# Patient Record
Sex: Female | Born: 1945 | Race: White | Hispanic: No | Smoking: Never smoker
Health system: Southern US, Community
[De-identification: ages and names within clinical notes are randomized; demographics above are authoritative.]

## PROBLEM LIST (undated history)

## (undated) DIAGNOSIS — I10 Essential (primary) hypertension: Secondary | ICD-10-CM

## (undated) DIAGNOSIS — E039 Hypothyroidism, unspecified: Secondary | ICD-10-CM

## (undated) HISTORY — PX: APPENDECTOMY: SHX54

## (undated) HISTORY — PX: WISDOM TOOTH EXTRACTION: SHX21

## (undated) HISTORY — DX: Hypothyroidism, unspecified: E03.9

## (undated) HISTORY — PX: TOTAL ABDOMINAL HYSTERECTOMY W/ BILATERAL SALPINGOOPHORECTOMY: SHX83

## (undated) HISTORY — PX: CHOLECYSTECTOMY: SHX55

## (undated) HISTORY — DX: Essential (primary) hypertension: I10

## (undated) HISTORY — PX: TONSILLECTOMY: SUR1361

---

## 1998-11-07 ENCOUNTER — Other Ambulatory Visit: Admission: RE | Admit: 1998-11-07 | Discharge: 1998-11-07 | Payer: Self-pay | Admitting: Obstetrics and Gynecology

## 1999-12-09 ENCOUNTER — Other Ambulatory Visit: Admission: RE | Admit: 1999-12-09 | Discharge: 1999-12-09 | Payer: Self-pay | Admitting: *Deleted

## 2000-12-10 ENCOUNTER — Other Ambulatory Visit: Admission: RE | Admit: 2000-12-10 | Discharge: 2000-12-10 | Payer: Self-pay | Admitting: *Deleted

## 2001-12-14 ENCOUNTER — Other Ambulatory Visit: Admission: RE | Admit: 2001-12-14 | Discharge: 2001-12-14 | Payer: Self-pay | Admitting: Obstetrics and Gynecology

## 2001-12-28 ENCOUNTER — Encounter: Payer: Self-pay | Admitting: Emergency Medicine

## 2001-12-28 ENCOUNTER — Emergency Department (HOSPITAL_COMMUNITY): Admission: EM | Admit: 2001-12-28 | Discharge: 2001-12-28 | Payer: Self-pay | Admitting: Emergency Medicine

## 2002-09-08 ENCOUNTER — Encounter: Payer: Self-pay | Admitting: Orthopedic Surgery

## 2002-09-08 ENCOUNTER — Emergency Department (HOSPITAL_COMMUNITY): Admission: EM | Admit: 2002-09-08 | Discharge: 2002-09-09 | Payer: Self-pay | Admitting: Emergency Medicine

## 2002-09-09 ENCOUNTER — Encounter: Payer: Self-pay | Admitting: Orthopedic Surgery

## 2002-12-16 ENCOUNTER — Other Ambulatory Visit: Admission: RE | Admit: 2002-12-16 | Discharge: 2002-12-16 | Payer: Self-pay | Admitting: Obstetrics and Gynecology

## 2004-08-19 ENCOUNTER — Ambulatory Visit: Payer: Self-pay | Admitting: Internal Medicine

## 2004-10-10 ENCOUNTER — Ambulatory Visit: Payer: Self-pay | Admitting: Internal Medicine

## 2004-10-29 ENCOUNTER — Ambulatory Visit: Payer: Self-pay | Admitting: Internal Medicine

## 2004-12-17 ENCOUNTER — Ambulatory Visit: Payer: Self-pay | Admitting: Internal Medicine

## 2005-03-20 ENCOUNTER — Ambulatory Visit: Payer: Self-pay | Admitting: Internal Medicine

## 2005-06-20 ENCOUNTER — Ambulatory Visit: Payer: Self-pay | Admitting: Internal Medicine

## 2005-09-18 ENCOUNTER — Ambulatory Visit: Payer: Self-pay | Admitting: Internal Medicine

## 2006-01-20 ENCOUNTER — Ambulatory Visit: Payer: Self-pay | Admitting: Internal Medicine

## 2006-01-26 ENCOUNTER — Ambulatory Visit: Payer: Self-pay | Admitting: Internal Medicine

## 2006-04-08 ENCOUNTER — Ambulatory Visit: Payer: Self-pay | Admitting: Internal Medicine

## 2006-05-28 ENCOUNTER — Encounter: Admission: RE | Admit: 2006-05-28 | Discharge: 2006-05-28 | Payer: Self-pay | Admitting: Obstetrics and Gynecology

## 2006-07-03 ENCOUNTER — Ambulatory Visit: Payer: Self-pay | Admitting: Internal Medicine

## 2006-09-23 ENCOUNTER — Ambulatory Visit: Payer: Self-pay | Admitting: Internal Medicine

## 2006-09-30 ENCOUNTER — Ambulatory Visit: Payer: Self-pay | Admitting: Internal Medicine

## 2006-10-07 ENCOUNTER — Ambulatory Visit: Payer: Self-pay | Admitting: Internal Medicine

## 2006-10-14 ENCOUNTER — Ambulatory Visit: Payer: Self-pay | Admitting: Internal Medicine

## 2006-10-21 ENCOUNTER — Ambulatory Visit: Payer: Self-pay | Admitting: Internal Medicine

## 2006-10-28 ENCOUNTER — Ambulatory Visit: Payer: Self-pay | Admitting: Internal Medicine

## 2006-11-03 ENCOUNTER — Ambulatory Visit: Payer: Self-pay | Admitting: Internal Medicine

## 2006-11-09 ENCOUNTER — Ambulatory Visit: Payer: Self-pay | Admitting: Internal Medicine

## 2006-11-16 ENCOUNTER — Ambulatory Visit: Payer: Self-pay | Admitting: Internal Medicine

## 2006-11-23 ENCOUNTER — Ambulatory Visit: Payer: Self-pay | Admitting: Internal Medicine

## 2006-11-30 ENCOUNTER — Ambulatory Visit: Payer: Self-pay | Admitting: Internal Medicine

## 2006-12-07 ENCOUNTER — Ambulatory Visit: Payer: Self-pay | Admitting: Internal Medicine

## 2006-12-14 ENCOUNTER — Ambulatory Visit: Payer: Self-pay | Admitting: Internal Medicine

## 2006-12-21 ENCOUNTER — Ambulatory Visit: Payer: Self-pay | Admitting: Internal Medicine

## 2006-12-28 ENCOUNTER — Ambulatory Visit: Payer: Self-pay | Admitting: Internal Medicine

## 2007-01-01 ENCOUNTER — Ambulatory Visit: Payer: Self-pay | Admitting: Internal Medicine

## 2007-01-04 ENCOUNTER — Ambulatory Visit: Payer: Self-pay | Admitting: Internal Medicine

## 2007-01-13 ENCOUNTER — Ambulatory Visit: Payer: Self-pay | Admitting: Internal Medicine

## 2007-01-14 ENCOUNTER — Ambulatory Visit: Payer: Self-pay | Admitting: Internal Medicine

## 2007-01-20 ENCOUNTER — Ambulatory Visit: Payer: Self-pay | Admitting: Internal Medicine

## 2007-01-27 ENCOUNTER — Ambulatory Visit: Payer: Self-pay | Admitting: Internal Medicine

## 2007-02-03 ENCOUNTER — Ambulatory Visit: Payer: Self-pay | Admitting: Internal Medicine

## 2007-02-10 ENCOUNTER — Ambulatory Visit: Payer: Self-pay | Admitting: Internal Medicine

## 2007-02-17 ENCOUNTER — Ambulatory Visit: Payer: Self-pay | Admitting: Internal Medicine

## 2007-02-25 ENCOUNTER — Ambulatory Visit: Payer: Self-pay | Admitting: Internal Medicine

## 2007-03-04 ENCOUNTER — Ambulatory Visit: Payer: Self-pay | Admitting: Internal Medicine

## 2007-03-12 ENCOUNTER — Ambulatory Visit: Payer: Self-pay | Admitting: Internal Medicine

## 2007-03-19 ENCOUNTER — Ambulatory Visit: Payer: Self-pay | Admitting: Internal Medicine

## 2007-03-29 ENCOUNTER — Ambulatory Visit: Payer: Self-pay | Admitting: Internal Medicine

## 2007-04-05 ENCOUNTER — Ambulatory Visit: Payer: Self-pay | Admitting: Internal Medicine

## 2007-04-12 ENCOUNTER — Ambulatory Visit: Payer: Self-pay | Admitting: Internal Medicine

## 2007-04-19 ENCOUNTER — Ambulatory Visit: Payer: Self-pay | Admitting: Internal Medicine

## 2007-04-27 ENCOUNTER — Ambulatory Visit: Payer: Self-pay | Admitting: Internal Medicine

## 2007-05-04 ENCOUNTER — Ambulatory Visit: Payer: Self-pay | Admitting: Internal Medicine

## 2007-05-11 ENCOUNTER — Ambulatory Visit: Payer: Self-pay | Admitting: Internal Medicine

## 2007-05-19 ENCOUNTER — Ambulatory Visit: Payer: Self-pay | Admitting: Internal Medicine

## 2007-05-27 ENCOUNTER — Ambulatory Visit: Payer: Self-pay | Admitting: Internal Medicine

## 2007-06-03 ENCOUNTER — Ambulatory Visit: Payer: Self-pay | Admitting: Internal Medicine

## 2007-06-10 ENCOUNTER — Ambulatory Visit: Payer: Self-pay | Admitting: Internal Medicine

## 2007-06-18 ENCOUNTER — Ambulatory Visit: Payer: Self-pay | Admitting: Internal Medicine

## 2007-06-24 ENCOUNTER — Ambulatory Visit: Payer: Self-pay | Admitting: Internal Medicine

## 2007-07-01 ENCOUNTER — Ambulatory Visit: Payer: Self-pay | Admitting: Internal Medicine

## 2007-07-08 ENCOUNTER — Ambulatory Visit: Payer: Self-pay | Admitting: Internal Medicine

## 2007-07-14 ENCOUNTER — Ambulatory Visit: Payer: Self-pay | Admitting: Internal Medicine

## 2007-07-14 DIAGNOSIS — J4520 Mild intermittent asthma, uncomplicated: Secondary | ICD-10-CM | POA: Insufficient documentation

## 2007-07-14 DIAGNOSIS — J309 Allergic rhinitis, unspecified: Secondary | ICD-10-CM | POA: Insufficient documentation

## 2007-07-21 ENCOUNTER — Ambulatory Visit: Payer: Self-pay | Admitting: Internal Medicine

## 2007-07-27 ENCOUNTER — Ambulatory Visit: Payer: Self-pay | Admitting: Internal Medicine

## 2007-08-02 ENCOUNTER — Ambulatory Visit: Payer: Self-pay | Admitting: Internal Medicine

## 2007-08-09 ENCOUNTER — Ambulatory Visit: Payer: Self-pay | Admitting: Internal Medicine

## 2007-08-17 ENCOUNTER — Ambulatory Visit: Payer: Self-pay | Admitting: Internal Medicine

## 2007-08-23 ENCOUNTER — Ambulatory Visit: Payer: Self-pay | Admitting: Internal Medicine

## 2007-08-30 ENCOUNTER — Ambulatory Visit: Payer: Self-pay | Admitting: Internal Medicine

## 2007-09-06 ENCOUNTER — Ambulatory Visit: Payer: Self-pay | Admitting: Internal Medicine

## 2007-09-13 ENCOUNTER — Ambulatory Visit: Payer: Self-pay | Admitting: Internal Medicine

## 2007-09-20 ENCOUNTER — Ambulatory Visit: Payer: Self-pay | Admitting: Internal Medicine

## 2007-09-21 ENCOUNTER — Ambulatory Visit: Payer: Self-pay | Admitting: Internal Medicine

## 2007-09-28 ENCOUNTER — Ambulatory Visit: Payer: Self-pay | Admitting: Internal Medicine

## 2007-10-07 ENCOUNTER — Ambulatory Visit: Payer: Self-pay | Admitting: Internal Medicine

## 2007-10-12 ENCOUNTER — Ambulatory Visit: Payer: Self-pay | Admitting: Internal Medicine

## 2007-10-18 ENCOUNTER — Ambulatory Visit: Payer: Self-pay | Admitting: Internal Medicine

## 2007-10-25 ENCOUNTER — Ambulatory Visit: Payer: Self-pay | Admitting: Internal Medicine

## 2007-11-01 ENCOUNTER — Ambulatory Visit: Payer: Self-pay | Admitting: Internal Medicine

## 2007-11-08 ENCOUNTER — Ambulatory Visit: Payer: Self-pay | Admitting: Internal Medicine

## 2007-11-15 ENCOUNTER — Ambulatory Visit: Payer: Self-pay | Admitting: Internal Medicine

## 2007-11-23 ENCOUNTER — Ambulatory Visit: Payer: Self-pay | Admitting: Internal Medicine

## 2007-11-29 ENCOUNTER — Ambulatory Visit: Payer: Self-pay | Admitting: Internal Medicine

## 2007-12-06 ENCOUNTER — Ambulatory Visit: Payer: Self-pay | Admitting: Internal Medicine

## 2007-12-13 ENCOUNTER — Ambulatory Visit: Payer: Self-pay | Admitting: Internal Medicine

## 2007-12-20 ENCOUNTER — Ambulatory Visit: Payer: Self-pay | Admitting: Internal Medicine

## 2007-12-27 ENCOUNTER — Ambulatory Visit: Payer: Self-pay | Admitting: Internal Medicine

## 2008-01-03 ENCOUNTER — Ambulatory Visit: Payer: Self-pay | Admitting: Internal Medicine

## 2008-01-11 ENCOUNTER — Ambulatory Visit: Payer: Self-pay | Admitting: Internal Medicine

## 2008-01-18 ENCOUNTER — Ambulatory Visit: Payer: Self-pay | Admitting: Internal Medicine

## 2008-01-18 ENCOUNTER — Telehealth (INDEPENDENT_AMBULATORY_CARE_PROVIDER_SITE_OTHER): Payer: Self-pay | Admitting: *Deleted

## 2008-01-19 ENCOUNTER — Ambulatory Visit: Payer: Self-pay | Admitting: Internal Medicine

## 2008-01-24 ENCOUNTER — Ambulatory Visit: Payer: Self-pay | Admitting: Internal Medicine

## 2008-01-31 ENCOUNTER — Ambulatory Visit: Payer: Self-pay | Admitting: Internal Medicine

## 2008-01-31 ENCOUNTER — Ambulatory Visit: Payer: Self-pay | Admitting: Pulmonary Disease

## 2008-02-07 ENCOUNTER — Ambulatory Visit: Payer: Self-pay | Admitting: Internal Medicine

## 2008-02-14 ENCOUNTER — Ambulatory Visit: Payer: Self-pay | Admitting: Internal Medicine

## 2008-02-16 ENCOUNTER — Ambulatory Visit: Payer: Self-pay | Admitting: Internal Medicine

## 2008-02-16 DIAGNOSIS — J018 Other acute sinusitis: Secondary | ICD-10-CM | POA: Insufficient documentation

## 2008-02-21 ENCOUNTER — Ambulatory Visit: Payer: Self-pay | Admitting: Internal Medicine

## 2008-03-20 ENCOUNTER — Ambulatory Visit: Payer: Self-pay | Admitting: Internal Medicine

## 2008-03-27 ENCOUNTER — Ambulatory Visit: Payer: Self-pay | Admitting: Internal Medicine

## 2008-04-03 ENCOUNTER — Ambulatory Visit: Payer: Self-pay | Admitting: Internal Medicine

## 2008-04-11 ENCOUNTER — Ambulatory Visit: Payer: Self-pay | Admitting: Internal Medicine

## 2008-04-17 ENCOUNTER — Ambulatory Visit: Payer: Self-pay | Admitting: Internal Medicine

## 2008-04-24 ENCOUNTER — Ambulatory Visit: Payer: Self-pay | Admitting: Internal Medicine

## 2008-05-02 ENCOUNTER — Ambulatory Visit: Payer: Self-pay | Admitting: Internal Medicine

## 2008-05-08 ENCOUNTER — Ambulatory Visit: Payer: Self-pay | Admitting: Internal Medicine

## 2008-05-10 ENCOUNTER — Ambulatory Visit: Payer: Self-pay | Admitting: Internal Medicine

## 2008-05-12 LAB — HM COLONOSCOPY

## 2008-05-15 ENCOUNTER — Ambulatory Visit: Payer: Self-pay | Admitting: Internal Medicine

## 2008-05-22 ENCOUNTER — Ambulatory Visit: Payer: Self-pay | Admitting: Internal Medicine

## 2008-05-30 ENCOUNTER — Ambulatory Visit: Payer: Self-pay | Admitting: Internal Medicine

## 2008-05-30 DIAGNOSIS — J111 Influenza due to unidentified influenza virus with other respiratory manifestations: Secondary | ICD-10-CM | POA: Insufficient documentation

## 2008-05-30 DIAGNOSIS — E039 Hypothyroidism, unspecified: Secondary | ICD-10-CM | POA: Insufficient documentation

## 2008-06-05 ENCOUNTER — Ambulatory Visit: Payer: Self-pay | Admitting: Internal Medicine

## 2008-06-13 ENCOUNTER — Ambulatory Visit: Payer: Self-pay | Admitting: Internal Medicine

## 2008-06-19 ENCOUNTER — Ambulatory Visit: Payer: Self-pay | Admitting: Internal Medicine

## 2008-06-26 ENCOUNTER — Ambulatory Visit: Payer: Self-pay | Admitting: Internal Medicine

## 2008-07-03 ENCOUNTER — Ambulatory Visit: Payer: Self-pay | Admitting: Internal Medicine

## 2008-07-10 ENCOUNTER — Ambulatory Visit: Payer: Self-pay | Admitting: Internal Medicine

## 2008-07-17 ENCOUNTER — Ambulatory Visit: Payer: Self-pay | Admitting: Internal Medicine

## 2008-07-24 ENCOUNTER — Ambulatory Visit: Payer: Self-pay | Admitting: Internal Medicine

## 2008-07-31 ENCOUNTER — Ambulatory Visit: Payer: Self-pay | Admitting: Internal Medicine

## 2008-08-07 ENCOUNTER — Ambulatory Visit: Payer: Self-pay | Admitting: Internal Medicine

## 2008-08-14 ENCOUNTER — Ambulatory Visit: Payer: Self-pay | Admitting: Internal Medicine

## 2008-08-21 ENCOUNTER — Ambulatory Visit: Payer: Self-pay | Admitting: Internal Medicine

## 2008-08-28 ENCOUNTER — Ambulatory Visit: Payer: Self-pay | Admitting: Internal Medicine

## 2008-09-04 ENCOUNTER — Ambulatory Visit: Payer: Self-pay | Admitting: Internal Medicine

## 2008-09-12 ENCOUNTER — Ambulatory Visit: Payer: Self-pay | Admitting: Internal Medicine

## 2008-09-13 ENCOUNTER — Ambulatory Visit: Payer: Self-pay | Admitting: Internal Medicine

## 2008-09-18 ENCOUNTER — Ambulatory Visit: Payer: Self-pay | Admitting: Internal Medicine

## 2008-09-25 ENCOUNTER — Ambulatory Visit: Payer: Self-pay | Admitting: Internal Medicine

## 2008-10-02 ENCOUNTER — Ambulatory Visit: Payer: Self-pay | Admitting: Internal Medicine

## 2008-10-10 ENCOUNTER — Ambulatory Visit: Payer: Self-pay | Admitting: Internal Medicine

## 2008-10-16 ENCOUNTER — Ambulatory Visit: Payer: Self-pay | Admitting: Internal Medicine

## 2008-10-23 ENCOUNTER — Ambulatory Visit: Payer: Self-pay | Admitting: Internal Medicine

## 2008-10-30 ENCOUNTER — Ambulatory Visit: Payer: Self-pay | Admitting: Internal Medicine

## 2008-11-06 ENCOUNTER — Ambulatory Visit: Payer: Self-pay | Admitting: Internal Medicine

## 2008-11-14 ENCOUNTER — Ambulatory Visit: Payer: Self-pay | Admitting: Internal Medicine

## 2008-11-20 ENCOUNTER — Ambulatory Visit: Payer: Self-pay | Admitting: Internal Medicine

## 2008-11-23 ENCOUNTER — Ambulatory Visit: Payer: Self-pay | Admitting: Internal Medicine

## 2008-11-27 ENCOUNTER — Ambulatory Visit: Payer: Self-pay | Admitting: Internal Medicine

## 2008-12-04 ENCOUNTER — Ambulatory Visit: Payer: Self-pay | Admitting: Internal Medicine

## 2008-12-12 ENCOUNTER — Ambulatory Visit: Payer: Self-pay | Admitting: Internal Medicine

## 2008-12-18 ENCOUNTER — Ambulatory Visit: Payer: Self-pay | Admitting: Internal Medicine

## 2008-12-25 ENCOUNTER — Ambulatory Visit: Payer: Self-pay | Admitting: Internal Medicine

## 2009-01-08 ENCOUNTER — Ambulatory Visit: Payer: Self-pay | Admitting: Internal Medicine

## 2009-01-09 ENCOUNTER — Ambulatory Visit: Payer: Self-pay | Admitting: Internal Medicine

## 2009-01-16 ENCOUNTER — Ambulatory Visit: Payer: Self-pay | Admitting: Internal Medicine

## 2009-01-22 ENCOUNTER — Ambulatory Visit: Payer: Self-pay | Admitting: Internal Medicine

## 2009-01-29 ENCOUNTER — Ambulatory Visit: Payer: Self-pay | Admitting: Internal Medicine

## 2009-02-05 ENCOUNTER — Ambulatory Visit: Payer: Self-pay | Admitting: Internal Medicine

## 2009-02-15 ENCOUNTER — Ambulatory Visit: Payer: Self-pay | Admitting: Internal Medicine

## 2009-02-22 ENCOUNTER — Ambulatory Visit: Payer: Self-pay | Admitting: Internal Medicine

## 2009-03-01 ENCOUNTER — Ambulatory Visit: Payer: Self-pay | Admitting: Internal Medicine

## 2009-03-15 ENCOUNTER — Ambulatory Visit: Payer: Self-pay | Admitting: Internal Medicine

## 2009-03-20 ENCOUNTER — Ambulatory Visit: Payer: Self-pay | Admitting: Internal Medicine

## 2009-03-26 ENCOUNTER — Ambulatory Visit: Payer: Self-pay | Admitting: Internal Medicine

## 2009-04-02 ENCOUNTER — Ambulatory Visit: Payer: Self-pay | Admitting: Internal Medicine

## 2009-04-09 ENCOUNTER — Ambulatory Visit: Payer: Self-pay | Admitting: Internal Medicine

## 2009-04-16 ENCOUNTER — Ambulatory Visit: Payer: Self-pay | Admitting: Internal Medicine

## 2009-04-23 ENCOUNTER — Ambulatory Visit: Payer: Self-pay | Admitting: Internal Medicine

## 2009-04-23 ENCOUNTER — Telehealth: Payer: Self-pay | Admitting: Internal Medicine

## 2009-04-30 ENCOUNTER — Ambulatory Visit: Payer: Self-pay | Admitting: Internal Medicine

## 2009-05-07 ENCOUNTER — Ambulatory Visit: Payer: Self-pay | Admitting: Internal Medicine

## 2009-05-08 ENCOUNTER — Ambulatory Visit: Payer: Self-pay | Admitting: Internal Medicine

## 2009-05-15 ENCOUNTER — Ambulatory Visit: Payer: Self-pay | Admitting: Internal Medicine

## 2009-05-21 ENCOUNTER — Ambulatory Visit: Payer: Self-pay | Admitting: Internal Medicine

## 2009-05-28 ENCOUNTER — Ambulatory Visit: Payer: Self-pay | Admitting: Internal Medicine

## 2009-06-04 ENCOUNTER — Ambulatory Visit: Payer: Self-pay | Admitting: Internal Medicine

## 2009-06-11 ENCOUNTER — Ambulatory Visit: Payer: Self-pay | Admitting: Internal Medicine

## 2009-06-18 ENCOUNTER — Ambulatory Visit: Payer: Self-pay | Admitting: Internal Medicine

## 2009-06-26 ENCOUNTER — Ambulatory Visit: Payer: Self-pay | Admitting: Internal Medicine

## 2009-07-02 ENCOUNTER — Ambulatory Visit: Payer: Self-pay | Admitting: Internal Medicine

## 2009-07-09 ENCOUNTER — Ambulatory Visit: Payer: Self-pay | Admitting: Internal Medicine

## 2009-07-16 ENCOUNTER — Ambulatory Visit: Payer: Self-pay | Admitting: Internal Medicine

## 2009-07-23 ENCOUNTER — Ambulatory Visit: Payer: Self-pay | Admitting: Internal Medicine

## 2009-07-30 ENCOUNTER — Ambulatory Visit: Payer: Self-pay | Admitting: Internal Medicine

## 2009-08-07 ENCOUNTER — Ambulatory Visit: Payer: Self-pay | Admitting: Internal Medicine

## 2009-08-13 ENCOUNTER — Ambulatory Visit: Payer: Self-pay | Admitting: Internal Medicine

## 2009-08-23 ENCOUNTER — Ambulatory Visit: Payer: Self-pay | Admitting: Internal Medicine

## 2009-08-29 ENCOUNTER — Ambulatory Visit: Payer: Self-pay | Admitting: Internal Medicine

## 2009-08-30 ENCOUNTER — Ambulatory Visit: Payer: Self-pay | Admitting: Internal Medicine

## 2009-09-03 ENCOUNTER — Ambulatory Visit: Payer: Self-pay | Admitting: Internal Medicine

## 2009-09-10 ENCOUNTER — Ambulatory Visit: Payer: Self-pay | Admitting: Internal Medicine

## 2009-09-17 ENCOUNTER — Ambulatory Visit: Payer: Self-pay | Admitting: Internal Medicine

## 2009-09-24 ENCOUNTER — Ambulatory Visit: Payer: Self-pay | Admitting: Internal Medicine

## 2009-10-01 ENCOUNTER — Ambulatory Visit: Payer: Self-pay | Admitting: Internal Medicine

## 2009-10-09 ENCOUNTER — Telehealth: Payer: Self-pay | Admitting: Internal Medicine

## 2009-10-09 ENCOUNTER — Ambulatory Visit: Payer: Self-pay | Admitting: Internal Medicine

## 2009-10-15 ENCOUNTER — Ambulatory Visit: Payer: Self-pay | Admitting: Internal Medicine

## 2009-10-22 ENCOUNTER — Ambulatory Visit: Payer: Self-pay | Admitting: Internal Medicine

## 2009-10-29 ENCOUNTER — Ambulatory Visit: Payer: Self-pay | Admitting: Internal Medicine

## 2009-11-05 ENCOUNTER — Ambulatory Visit: Payer: Self-pay | Admitting: Internal Medicine

## 2009-11-14 ENCOUNTER — Ambulatory Visit: Payer: Self-pay | Admitting: Internal Medicine

## 2009-11-19 ENCOUNTER — Ambulatory Visit: Payer: Self-pay | Admitting: Internal Medicine

## 2009-11-26 ENCOUNTER — Ambulatory Visit: Payer: Self-pay | Admitting: Internal Medicine

## 2009-12-03 ENCOUNTER — Ambulatory Visit: Payer: Self-pay | Admitting: Internal Medicine

## 2009-12-10 ENCOUNTER — Ambulatory Visit: Payer: Self-pay | Admitting: Internal Medicine

## 2009-12-17 ENCOUNTER — Ambulatory Visit: Payer: Self-pay | Admitting: Internal Medicine

## 2009-12-25 ENCOUNTER — Ambulatory Visit: Payer: Self-pay | Admitting: Internal Medicine

## 2009-12-28 ENCOUNTER — Ambulatory Visit: Payer: Self-pay | Admitting: Internal Medicine

## 2009-12-31 ENCOUNTER — Ambulatory Visit: Payer: Self-pay | Admitting: Internal Medicine

## 2010-01-07 ENCOUNTER — Ambulatory Visit: Payer: Self-pay | Admitting: Internal Medicine

## 2010-01-15 ENCOUNTER — Ambulatory Visit: Payer: Self-pay | Admitting: Internal Medicine

## 2010-01-21 ENCOUNTER — Ambulatory Visit: Payer: Self-pay | Admitting: Internal Medicine

## 2010-01-28 ENCOUNTER — Ambulatory Visit: Payer: Self-pay | Admitting: Internal Medicine

## 2010-02-04 ENCOUNTER — Ambulatory Visit: Payer: Self-pay | Admitting: Internal Medicine

## 2010-02-11 ENCOUNTER — Ambulatory Visit: Payer: Self-pay | Admitting: Internal Medicine

## 2010-02-18 ENCOUNTER — Ambulatory Visit: Payer: Self-pay | Admitting: Internal Medicine

## 2010-02-26 ENCOUNTER — Ambulatory Visit: Payer: Self-pay | Admitting: Internal Medicine

## 2010-02-28 ENCOUNTER — Ambulatory Visit: Payer: Self-pay | Admitting: Internal Medicine

## 2010-03-04 ENCOUNTER — Ambulatory Visit: Payer: Self-pay | Admitting: Internal Medicine

## 2010-03-11 ENCOUNTER — Ambulatory Visit: Payer: Self-pay | Admitting: Internal Medicine

## 2010-03-18 ENCOUNTER — Telehealth: Payer: Self-pay | Admitting: Internal Medicine

## 2010-03-18 ENCOUNTER — Ambulatory Visit: Payer: Self-pay | Admitting: Internal Medicine

## 2010-03-25 ENCOUNTER — Ambulatory Visit: Payer: Self-pay | Admitting: Internal Medicine

## 2010-04-01 ENCOUNTER — Ambulatory Visit: Payer: Self-pay | Admitting: Internal Medicine

## 2010-04-09 ENCOUNTER — Ambulatory Visit: Payer: Self-pay | Admitting: Internal Medicine

## 2010-04-15 ENCOUNTER — Ambulatory Visit: Payer: Self-pay | Admitting: Internal Medicine

## 2010-04-22 ENCOUNTER — Ambulatory Visit: Payer: Self-pay | Admitting: Internal Medicine

## 2010-04-30 ENCOUNTER — Ambulatory Visit: Payer: Self-pay | Admitting: Internal Medicine

## 2010-05-07 ENCOUNTER — Ambulatory Visit: Payer: Self-pay | Admitting: Internal Medicine

## 2010-05-10 ENCOUNTER — Ambulatory Visit: Payer: Self-pay | Admitting: Internal Medicine

## 2010-05-27 ENCOUNTER — Ambulatory Visit: Payer: Self-pay | Admitting: Internal Medicine

## 2010-05-30 ENCOUNTER — Ambulatory Visit: Payer: Self-pay | Admitting: Internal Medicine

## 2010-06-02 ENCOUNTER — Encounter: Payer: Self-pay | Admitting: Obstetrics and Gynecology

## 2010-06-10 ENCOUNTER — Ambulatory Visit: Payer: Self-pay | Admitting: Internal Medicine

## 2010-06-13 NOTE — Miscellaneous (Signed)
Summary: Injection Record/[facility]  Injection Record/[facility]   Imported By: Sherian Rein 10/02/2009 11:17:37  _____________________________________________________________________  External Attachment:    Type:   Image     Comment:   External Document

## 2010-06-13 NOTE — Assessment & Plan Note (Signed)
Summary: Alicia Wallace (yearly f/u per pt ) kp   Primary Provider/Referring Provider:  Altheimer  CC:  yearly follow up visit-alleriges..  History of Present Illness: 02/16/08: 4 days ? sinusitis with HA, yellow, ears ache, sore throat. Took Equate for allergy. catheter continues allergy vaccine 1:10.  Has had flu shot.  05/30/08- Asthma, allergic rhinitis 1 week ago began chilling. Had fever initially, coughing yellow, myalgias, little GI. Had seasonal but not H1N1 flu. Chest tight. "Sunday for SOB went to [facility], put on zpak, now day 3 but feeling worse. Headache.  February 15, 2009- Asthma, rhinitis Had flu vax last week. Feeling  very well now with no problems since her flu syndrome last winter.  Allergy vaccine continues to help in her opinion and has been well tolerated.. Allegra D caused tachycardia but generic loratadine is serving her well, taken most days. Sometimes winter associated with sinus probs and headaches, but not wheeze or chest tightness.  April 09, 2010- Asthma, rhinitis Nurse-CC: yearly follow up visit-alleriges. Doing well. Med talk- especially utility of Singulair. No recent wheeze or need for rescue inhaler. Did have sinusitis and we called in Z pak a couple of weeks ago. Feels well. Voice may come and go. Some increase in heartburn lately, but doesn't wake her. Discussed reflux/ acid control. Continues doing well with allergy vaccine.    Asthma History    Initial Asthma Severity Rating:    Age range: 12+ years    Symptoms: 0-2 days/week    Nighttime Awakenings: 0-2/month    Interferes w/ normal activity: no limitations    SABA use (not for EIB): 0-2 days/week    Asthma Severity Assessment: Intermittent   Preventive Screening-Counseling & Management  Alcohol-Tobacco     Smoking Status: never  Current Medications (verified): 1)  Allergy Vaccine Gh 1:10 2)  Epipen 2-Pak 0.3 Mg/0.3ml Devi (Epinephrine) .... Use As Directed 3)  Singulair 10 Mg  Tabs (Montelukast  Sodium) .... Take 1 Tablet By Mouth Once A Day 4)  Premarin 0.3 Mg Tabs (Estrogens Conjugated) .... Take 1 By Mouth Once Daily 5)  Synthroid 75 Mcg  Tabs (Levothyroxine Sodium) .... Take 1 Tablet By Mouth Once A Day 6)  Toprol Xl 100 Mg  Tb24 (Metoprolol Succinate) 7)  Proair Hfa 108 (90 Base) Mcg/act Aers (Albuterol Sulfate) .... 2 Puffs Four Times A Day As Needed  Allergies (verified): 1)  ! Penicillin 2)  ! Sulfa  Review of Systems      See HPI  The patient denies shortness of breath with activity, shortness of breath at rest, productive cough, non-productive cough, coughing up blood, chest pain, irregular heartbeats, acid heartburn, indigestion, loss of appetite, weight change, abdominal pain, difficulty swallowing, sore throat, tooth/dental problems, headaches, nasal congestion/difficulty breathing through nose, and sneezing.    Vital Signs:  Patient profile:   65 year old female Height:      60 inches Weight:      152.38 pounds BMI:     29.87 O2 Sat:      98"  % on Room air Pulse rate:   88 / minute BP sitting:   134 / 80  (left arm) Cuff size:   large  Vitals Entered By: Reynaldo Minium CMA (April 09, 2010 9:21 AM)  O2 Flow:  Room air  Physical Exam  Additional Exam:  General: A/Ox3; pleasant and cooperative, NAD, hoarse transiently, 98% on room air SKIN: no rash, lesions NODES: no lymphadenopathy HEENT: Bolt/AT, EOM- WNL, Conjuctivae- clear, PERRLA, TM-WNL, Nose-  clear, Throat- clear and wnl, Mallampati III NECK: Supple w/ fair ROM, JVD- none, normal carotid impulses w/o bruits Thyroid- CHEST: Clear to P&A,  HEART: RRR, no m/g/r heard ABDOMEN: Soft and nl;  QQV:ZDGL, nl pulses, no edema  NEURO: Grossly intact to observation      Impression & Recommendations:  Problem # 1:  ALLERGIC RHINITIS (ICD-477.9)  Good control. She will continue allergy vaccine as discussed. She resolved a recent sinusitis Disciussed reflux precautions possibly pertinent to occasional  hoarsesness  Orders: Est. Patient Level III (87564)  Problem # 2:  ASTHMA (ICD-493.90) Good control with mild intermittent symptoms. She uses rescue inhaler appropriately when needed.   Medications Added to Medication List This Visit: 1)  Epipen 2-pak 0.3 Mg/0.31ml Devi (Epinephrine) .... Use as directed 2)  Premarin 0.3 Mg Tabs (Estrogens conjugated) .... Take 1 by mouth once daily  Patient Instructions: 1)  Please schedule a follow-up appointment in 1 year. Call sooner if needed. 2)  Refill scripts for Singulair and Epipen sent to [facility] 3)  Consider trying on and off Singulair a week or so at a time to see if it is helping you.  4)  consider trying an otc acid blocker to protect from heartburn 5)  omeprazole or generics of Prilosec are good choices Prescriptions: SINGULAIR 10 MG  TABS (MONTELUKAST SODIUM) Take 1 tablet by mouth once a day  #30 Each x prn   Entered and Authorized by:   Waymon Budge MD   Signed by:   Waymon Budge MD on 04/09/2010   Method used:   Electronically to        [facility] [address] (retail)       [phone]       [place], [place]  [address]       Ph: [phone]       Fax: [phone]   RxID:   [id number] EPIPEN 2-PAK 0.3 MG/0.3ML DEVI (EPINEPHRINE) Use as directed  #1 x prn   Entered and Authorized by:   Waymon Budge MD   Signed by:   Waymon Budge MD on 04/09/2010   Method used:   Electronically to        [facility] [address] (retail)       [phone]       [place], [place]  [address]       Ph: [phone]       Fax: [phone]   RxID:   [id number]

## 2010-06-13 NOTE — Miscellaneous (Signed)
Summary: Injection Record / [facility]    Injection Record / [facility]    Imported By: Lennie Odor 10/02/2009 15:23:48  _____________________________________________________________________  External Attachment:    Type:   Image     Comment:   External Document

## 2010-06-13 NOTE — Progress Notes (Signed)
Summary:  SICK Needs a Z-Pak  Phone Note Other Incoming   Caller: Pt. came in for her allery shot. Summary of Call: Mrs.Blann came in today she has taken mucinex&mucinex DM for the last wk. or two she is now coughing up and or blowing yellow or green mucus. She realizes she is due for a follow up but could not get an appt. with you till later this month. Would you please call in a Z-Pak at [facility] on [address]?(CB [phone])    Initial call taken by: Dimas Millin,  March 18, 2010 3:50 PM  Follow-up for Phone Call        Per CDY-okay to give Zpak #1 take as directed no refills.Reynaldo Minium CMA  March 18, 2010 4:19 PM   Additional Follow-up for Phone Call Additional follow up Details #1::        Rx for zpack sent- [facility] Vernie Murders  March 18, 2010 4:23 PM   [facility] Gweneth Dimitri RN  March 19, 2010 4:24PM  could not get ahold of pt so I called the pharmacy and pt has picked up rx for zpak. Carron Curie CMA  March 20, 2010 10:46 AM     New/Updated Medications: ZITHROMAX Z-PAK 250 MG TABS (AZITHROMYCIN) take as directed Prescriptions: ZITHROMAX Z-PAK 250 MG TABS (AZITHROMYCIN) take as directed  #1 x 0   Entered by:   Vernie Murders   Authorized by:   Waymon Budge MD   Signed by:   Vernie Murders on 03/18/2010   Method used:   Electronically to        [facility] [address] (retail)       [phone]       [place], [place]  [address]       Ph: [phone]       Fax: [phone]   RxID:   [id number]

## 2010-06-13 NOTE — Progress Notes (Signed)
Summary: sick  Phone Note Call from Patient Call back at Work Phone [phone]   Caller: Patient Call For: young Reason for Call: Talk to Nurse Summary of Call: head & sinus congestion, sore throat, drainage, coughing(fever Sunday), yellow plegm. [facility] [address] Initial call taken by: Angela Coberly,  Oct 09, 2009 10:48 AM  Follow-up for Phone Call        pt c/o sore throat, head congestion, sinus pressure, chest congestion with productive cough with yellow phlegm, fever, chills  since sunday.  Pt has tried Sudafed OTC with not much relief. Please advsie. Jennifer Castillo CMA  Oct 09, 2009 11:27 AM allergies: PCN, Sulfa  Additional Follow-up for Phone Call Additional follow up Details #1::        Per CDY-give Zpak #1 take as directed no refills.[name] Welchel CMA  Oct 09, 2009 12:19 PM   pt aware rx sent. Jennifer Castillo CMA  Oct 09, 2009 12:24 PM     Prescriptions: ZITHROMAX Z-PAK 250 MG TABS (AZITHROMYCIN) take 2 tablets today, and then one tablet daily until gone  #1 pak x 0   Entered by:   Jennifer Castillo CMA   Authorized by:   Clinton D Young MD   Signed by:   Jennifer Castillo CMA on 10/09/2009   Method used:   Electronically to        [facility] [address] (retail)       [phone]       [place], [place]  [address]       Ph: [phone]       Fax: [phone]   RxID:   [id number]

## 2010-06-13 NOTE — Miscellaneous (Signed)
Summary: Injection Record / [facility]    Injection Record /  Allergy    Imported By: Lennie Odor 10/18/2009 17:25:33  _____________________________________________________________________  External Attachment:    Type:   Image     Comment:   External Document

## 2010-06-13 NOTE — Miscellaneous (Signed)
Summary: Injection Record / [facility]    Injection Record / [facility]    Imported By: Lennie Odor 03/19/2010 15:05:33  _____________________________________________________________________  External Attachment:    Type:   Image     Comment:   External Document

## 2010-06-13 NOTE — Assessment & Plan Note (Signed)
Summary: FLU SHOT/[facility]  Nurse Visit   Allergies: 1)  ! Penicillin 2)  ! Sulfa  Orders Added: 1)  Admin 1st Vaccine [90471] 2)  Flu Vaccine 42yrs + [54098] Flu Vaccine Consent Questions     Do you have a history of severe allergic reactions to this vaccine? no    Any prior history of allergic reactions to egg and/or gelatin? no    Do you have a sensitivity to the preservative Thimersol? no    Do you have a past history of Guillan-Barre Syndrome? no    Do you currently have an acute febrile illness? no    Have you ever had a severe reaction to latex? no    Vaccine information given and explained to patient? yes    Are you currently pregnant? no    Lot Number:[id number]   Exp Date:11/09/2010   Site Given  Left Deltoid IM  Tammy Scott  February 28, 2010 5:27 PM

## 2010-06-14 NOTE — Miscellaneous (Signed)
Summary: Injection Record / [facility]    Injection Record / [facility]    Imported By: Lennie Odor 01/11/2010 11:47:01  _____________________________________________________________________  External Attachment:    Type:   Image     Comment:   External Document

## 2010-06-18 DIAGNOSIS — J301 Allergic rhinitis due to pollen: Secondary | ICD-10-CM

## 2010-06-24 ENCOUNTER — Ambulatory Visit (INDEPENDENT_AMBULATORY_CARE_PROVIDER_SITE_OTHER): Payer: BC Managed Care – PPO

## 2010-06-24 ENCOUNTER — Encounter: Payer: Self-pay | Admitting: Internal Medicine

## 2010-06-24 DIAGNOSIS — J301 Allergic rhinitis due to pollen: Secondary | ICD-10-CM

## 2010-07-01 ENCOUNTER — Ambulatory Visit (INDEPENDENT_AMBULATORY_CARE_PROVIDER_SITE_OTHER): Payer: BC Managed Care – PPO

## 2010-07-01 DIAGNOSIS — J301 Allergic rhinitis due to pollen: Secondary | ICD-10-CM

## 2010-07-09 ENCOUNTER — Ambulatory Visit (INDEPENDENT_AMBULATORY_CARE_PROVIDER_SITE_OTHER): Payer: BC Managed Care – PPO

## 2010-07-09 DIAGNOSIS — J301 Allergic rhinitis due to pollen: Secondary | ICD-10-CM

## 2010-07-09 NOTE — Miscellaneous (Signed)
Summary: Injection Record / [facility]    Injection Record / [facility]    Imported By: Lennie Odor 07/05/2010 12:29:48  _____________________________________________________________________  External Attachment:    Type:   Image     Comment:   External Document

## 2010-07-15 ENCOUNTER — Ambulatory Visit (INDEPENDENT_AMBULATORY_CARE_PROVIDER_SITE_OTHER): Payer: BC Managed Care – PPO

## 2010-07-15 ENCOUNTER — Encounter: Payer: Self-pay | Admitting: Internal Medicine

## 2010-07-15 DIAGNOSIS — J3089 Other allergic rhinitis: Secondary | ICD-10-CM

## 2010-07-15 DIAGNOSIS — J301 Allergic rhinitis due to pollen: Secondary | ICD-10-CM

## 2010-07-15 DIAGNOSIS — J302 Other seasonal allergic rhinitis: Secondary | ICD-10-CM | POA: Insufficient documentation

## 2010-07-22 ENCOUNTER — Encounter: Payer: Self-pay | Admitting: Internal Medicine

## 2010-07-22 ENCOUNTER — Ambulatory Visit (INDEPENDENT_AMBULATORY_CARE_PROVIDER_SITE_OTHER): Payer: BC Managed Care – PPO

## 2010-07-22 DIAGNOSIS — J301 Allergic rhinitis due to pollen: Secondary | ICD-10-CM

## 2010-07-23 NOTE — Assessment & Plan Note (Signed)
Summary: allergy/cb  Nurse Visit   Allergies: 1)  ! Penicillin 2)  ! Sulfa  Orders Added: 1)  Allergy Injection (1) [16109]

## 2010-07-30 ENCOUNTER — Ambulatory Visit (INDEPENDENT_AMBULATORY_CARE_PROVIDER_SITE_OTHER): Payer: BC Managed Care – PPO

## 2010-07-30 DIAGNOSIS — J301 Allergic rhinitis due to pollen: Secondary | ICD-10-CM

## 2010-07-30 NOTE — Assessment & Plan Note (Signed)
Summary: allergy/cb  Nurse Visit   Allergies: 1)  ! Penicillin 2)  ! Sulfa  Orders Added: 1)  Allergy Injection (1) [95115] 

## 2010-08-06 ENCOUNTER — Ambulatory Visit (INDEPENDENT_AMBULATORY_CARE_PROVIDER_SITE_OTHER): Payer: BC Managed Care – PPO

## 2010-08-06 DIAGNOSIS — J301 Allergic rhinitis due to pollen: Secondary | ICD-10-CM

## 2010-08-13 ENCOUNTER — Ambulatory Visit (INDEPENDENT_AMBULATORY_CARE_PROVIDER_SITE_OTHER): Payer: BC Managed Care – PPO

## 2010-08-13 DIAGNOSIS — J301 Allergic rhinitis due to pollen: Secondary | ICD-10-CM

## 2010-08-20 ENCOUNTER — Ambulatory Visit (INDEPENDENT_AMBULATORY_CARE_PROVIDER_SITE_OTHER): Payer: BC Managed Care – PPO

## 2010-08-20 DIAGNOSIS — J301 Allergic rhinitis due to pollen: Secondary | ICD-10-CM

## 2010-08-26 ENCOUNTER — Ambulatory Visit (INDEPENDENT_AMBULATORY_CARE_PROVIDER_SITE_OTHER): Payer: BC Managed Care – PPO

## 2010-08-26 DIAGNOSIS — J309 Allergic rhinitis, unspecified: Secondary | ICD-10-CM

## 2010-09-02 ENCOUNTER — Ambulatory Visit (INDEPENDENT_AMBULATORY_CARE_PROVIDER_SITE_OTHER): Payer: BC Managed Care – PPO

## 2010-09-02 DIAGNOSIS — J309 Allergic rhinitis, unspecified: Secondary | ICD-10-CM

## 2010-09-03 ENCOUNTER — Ambulatory Visit (INDEPENDENT_AMBULATORY_CARE_PROVIDER_SITE_OTHER): Payer: BC Managed Care – PPO

## 2010-09-03 DIAGNOSIS — J309 Allergic rhinitis, unspecified: Secondary | ICD-10-CM

## 2010-09-09 ENCOUNTER — Ambulatory Visit (INDEPENDENT_AMBULATORY_CARE_PROVIDER_SITE_OTHER): Payer: BC Managed Care – PPO

## 2010-09-09 DIAGNOSIS — J309 Allergic rhinitis, unspecified: Secondary | ICD-10-CM

## 2010-09-16 ENCOUNTER — Ambulatory Visit (INDEPENDENT_AMBULATORY_CARE_PROVIDER_SITE_OTHER): Payer: BC Managed Care – PPO

## 2010-09-16 DIAGNOSIS — J309 Allergic rhinitis, unspecified: Secondary | ICD-10-CM

## 2010-09-23 ENCOUNTER — Ambulatory Visit (INDEPENDENT_AMBULATORY_CARE_PROVIDER_SITE_OTHER): Payer: BC Managed Care – PPO

## 2010-09-23 DIAGNOSIS — J309 Allergic rhinitis, unspecified: Secondary | ICD-10-CM

## 2010-09-24 NOTE — Assessment & Plan Note (Signed)
[facility]                             PULMONARY OFFICE NOTE   PEBBLE, BOTKIN                       MRN:          [id number]  DATE:01/01/2007                            DOB:          April 24, 1946    PROBLEMS:  1. Asthma.  2. Allergic rhinitis.   HISTORY:  One year follow up. She is doing well. She continues allergy  vaccine at 1-10, getting injections here now, and feels that this going  very well. She needs Singulair refill. She is using Xopenex HFA as a  rescue inhaler very occasionally.   MEDICATIONS:  1. Allergy vaccine.  2. Allegra-D 12 hour b.i.d. p.r.n.  3. Premarin.  4. Synthroid 75 mcg.  5. Toprol XL 100 mg.  6. Singulair 10 mg.  7. Xopenex HFA.  8. She has an EpiPen.   Drug intolerant PENICILLIN and SULFA.   OBJECTIVE:  Weight 137 pounds, blood pressure 122/68, pulse 87, room air  saturation 99%. She looks very comfortable. Chest is clear. Nose and  throat are clear. Voice quality is normal. No visible drainage. Heart  sounds regular and normal.   IMPRESSION:  Good control of allergic rhinitis and asthma.   PLAN:  We refilled Singulair, discussed medication options, discussed  risk/benefit considerations of allergy vaccine. Schedule return one  year, earlier p.r.n.     Clinton D. Maple Hudson, MD, Tonny Bollman, FACP  Electronically Signed    CDY/MedQ  DD: 01/03/2007  DT: 01/04/2007  Job #: [id number]   cc:   [name], M.D.  [facility]

## 2010-09-27 NOTE — Assessment & Plan Note (Signed)
[facility]                               PULMONARY OFFICE NOTE   Alicia Wallace                       MRN:          [id number]  DATE:01/20/2006                            DOB:          02-08-1946    PROBLEM:  1. Asthma.  2. Allergic rhinitis.   HISTORY:  A one-year followup for this never smoker.  She continues allergy  vaccine at 1 to 10 given by her husband with no problems.  We have reviewed  policy on administration outside of medical office, anaphylaxis,  epinephrine.  She is asked appropriate questions, reviewed and signed our  waiver and has a new prescription for her Epi-Pen, which she has never  needed.  There have been no problems.  She is being evaluated for thyroid,  blood pressure and heart rate status with Dr. Leslie Dales.  She has needed her  albuterol inhaler in a long time and has no specific concerns or changes to  request.   MEDICATIONS:  1. Allergy vaccine at 1 to 10.  2. Allegra-D 12-hour b.i.d. most days.  3. Premarin 0.45 mg.  4. Actonel 35 mg.  5. Synthroid.  6. Toprol XL 100 mg.  7. Singulair.  8. Xopenex HFA metered inhaler, chosen to avoid cardiac stimulation.   ALLERGIES:  DRUG INTOLERANCE TO PENICILLIN AND SULFA.   OBJECTIVE:  VITAL SIGNS:  Weight 150 pounds, BP 122/68, pulse regular 91,  room air saturation 98%.  GENERAL:  She looks relaxed and comfortable, but speech is just a little  pressured, and I notice her heart rate is a little bit up, possibly  reflecting her thyroid status.  EYES, NOSE AND THROAT:  Look clear.  LUNGS:  Clear to P&A.  HEART:  Sounds were regular without murmur or gallop.   IMPRESSION:  Allergic rhinitis and minimal asthma, stable, under good  control.   PLAN:  Educational discussion and refill of her Epi-Pen as above.  Schedule  return one year, earlier p.r.n.  Continue allergy vaccine at 1 to 10.                                   Clinton D. Maple Hudson, MD, FCCP, FACP   CDY/MedQ  DD:  01/24/2006  DT:  01/26/2006  Job #:  [id number]   cc:   [name], M.D.  [name], M.D.

## 2010-09-30 ENCOUNTER — Ambulatory Visit (INDEPENDENT_AMBULATORY_CARE_PROVIDER_SITE_OTHER): Payer: BC Managed Care – PPO

## 2010-09-30 DIAGNOSIS — J309 Allergic rhinitis, unspecified: Secondary | ICD-10-CM

## 2010-10-08 ENCOUNTER — Ambulatory Visit (INDEPENDENT_AMBULATORY_CARE_PROVIDER_SITE_OTHER): Payer: BC Managed Care – PPO

## 2010-10-08 DIAGNOSIS — J309 Allergic rhinitis, unspecified: Secondary | ICD-10-CM

## 2010-10-14 ENCOUNTER — Ambulatory Visit (INDEPENDENT_AMBULATORY_CARE_PROVIDER_SITE_OTHER): Payer: BC Managed Care – PPO

## 2010-10-14 ENCOUNTER — Encounter: Payer: Self-pay | Admitting: Internal Medicine

## 2010-10-14 DIAGNOSIS — J309 Allergic rhinitis, unspecified: Secondary | ICD-10-CM

## 2010-10-21 ENCOUNTER — Ambulatory Visit (INDEPENDENT_AMBULATORY_CARE_PROVIDER_SITE_OTHER): Payer: BC Managed Care – PPO

## 2010-10-21 DIAGNOSIS — J309 Allergic rhinitis, unspecified: Secondary | ICD-10-CM

## 2010-10-28 ENCOUNTER — Ambulatory Visit (INDEPENDENT_AMBULATORY_CARE_PROVIDER_SITE_OTHER): Payer: BC Managed Care – PPO

## 2010-10-28 DIAGNOSIS — J309 Allergic rhinitis, unspecified: Secondary | ICD-10-CM

## 2010-11-04 ENCOUNTER — Ambulatory Visit (INDEPENDENT_AMBULATORY_CARE_PROVIDER_SITE_OTHER): Payer: BC Managed Care – PPO

## 2010-11-04 DIAGNOSIS — J309 Allergic rhinitis, unspecified: Secondary | ICD-10-CM

## 2010-11-11 ENCOUNTER — Ambulatory Visit (INDEPENDENT_AMBULATORY_CARE_PROVIDER_SITE_OTHER): Payer: BC Managed Care – PPO

## 2010-11-11 DIAGNOSIS — J309 Allergic rhinitis, unspecified: Secondary | ICD-10-CM

## 2010-11-18 ENCOUNTER — Ambulatory Visit (INDEPENDENT_AMBULATORY_CARE_PROVIDER_SITE_OTHER): Payer: BC Managed Care – PPO

## 2010-11-18 DIAGNOSIS — J309 Allergic rhinitis, unspecified: Secondary | ICD-10-CM

## 2010-11-25 ENCOUNTER — Ambulatory Visit (INDEPENDENT_AMBULATORY_CARE_PROVIDER_SITE_OTHER): Payer: BC Managed Care – PPO

## 2010-11-25 DIAGNOSIS — J309 Allergic rhinitis, unspecified: Secondary | ICD-10-CM

## 2010-12-02 ENCOUNTER — Ambulatory Visit (INDEPENDENT_AMBULATORY_CARE_PROVIDER_SITE_OTHER): Payer: BC Managed Care – PPO

## 2010-12-02 DIAGNOSIS — J309 Allergic rhinitis, unspecified: Secondary | ICD-10-CM

## 2010-12-09 ENCOUNTER — Ambulatory Visit (INDEPENDENT_AMBULATORY_CARE_PROVIDER_SITE_OTHER): Payer: BC Managed Care – PPO

## 2010-12-09 DIAGNOSIS — J309 Allergic rhinitis, unspecified: Secondary | ICD-10-CM

## 2010-12-17 ENCOUNTER — Ambulatory Visit (INDEPENDENT_AMBULATORY_CARE_PROVIDER_SITE_OTHER): Payer: BC Managed Care – PPO

## 2010-12-17 DIAGNOSIS — J309 Allergic rhinitis, unspecified: Secondary | ICD-10-CM

## 2010-12-23 ENCOUNTER — Ambulatory Visit (INDEPENDENT_AMBULATORY_CARE_PROVIDER_SITE_OTHER): Payer: BC Managed Care – PPO

## 2010-12-23 DIAGNOSIS — J309 Allergic rhinitis, unspecified: Secondary | ICD-10-CM

## 2010-12-25 ENCOUNTER — Encounter: Payer: Self-pay | Admitting: Internal Medicine

## 2010-12-30 ENCOUNTER — Ambulatory Visit (INDEPENDENT_AMBULATORY_CARE_PROVIDER_SITE_OTHER): Payer: BC Managed Care – PPO

## 2010-12-30 DIAGNOSIS — J309 Allergic rhinitis, unspecified: Secondary | ICD-10-CM

## 2010-12-31 ENCOUNTER — Ambulatory Visit (INDEPENDENT_AMBULATORY_CARE_PROVIDER_SITE_OTHER): Payer: BC Managed Care – PPO

## 2010-12-31 DIAGNOSIS — J309 Allergic rhinitis, unspecified: Secondary | ICD-10-CM

## 2011-01-06 ENCOUNTER — Ambulatory Visit (INDEPENDENT_AMBULATORY_CARE_PROVIDER_SITE_OTHER): Payer: BC Managed Care – PPO

## 2011-01-06 DIAGNOSIS — J309 Allergic rhinitis, unspecified: Secondary | ICD-10-CM

## 2011-01-14 ENCOUNTER — Ambulatory Visit (INDEPENDENT_AMBULATORY_CARE_PROVIDER_SITE_OTHER): Payer: BC Managed Care – PPO

## 2011-01-14 DIAGNOSIS — J309 Allergic rhinitis, unspecified: Secondary | ICD-10-CM

## 2011-01-20 ENCOUNTER — Ambulatory Visit (INDEPENDENT_AMBULATORY_CARE_PROVIDER_SITE_OTHER): Payer: BC Managed Care – PPO

## 2011-01-20 DIAGNOSIS — J309 Allergic rhinitis, unspecified: Secondary | ICD-10-CM

## 2011-01-27 ENCOUNTER — Ambulatory Visit (INDEPENDENT_AMBULATORY_CARE_PROVIDER_SITE_OTHER): Payer: BC Managed Care – PPO

## 2011-01-27 DIAGNOSIS — J309 Allergic rhinitis, unspecified: Secondary | ICD-10-CM

## 2011-02-03 ENCOUNTER — Ambulatory Visit (INDEPENDENT_AMBULATORY_CARE_PROVIDER_SITE_OTHER): Payer: BC Managed Care – PPO

## 2011-02-03 DIAGNOSIS — J309 Allergic rhinitis, unspecified: Secondary | ICD-10-CM

## 2011-02-11 ENCOUNTER — Ambulatory Visit (INDEPENDENT_AMBULATORY_CARE_PROVIDER_SITE_OTHER): Payer: BC Managed Care – PPO

## 2011-02-11 DIAGNOSIS — J309 Allergic rhinitis, unspecified: Secondary | ICD-10-CM

## 2011-02-17 ENCOUNTER — Ambulatory Visit (INDEPENDENT_AMBULATORY_CARE_PROVIDER_SITE_OTHER): Payer: BC Managed Care – PPO

## 2011-02-17 DIAGNOSIS — J309 Allergic rhinitis, unspecified: Secondary | ICD-10-CM

## 2011-02-24 ENCOUNTER — Ambulatory Visit (INDEPENDENT_AMBULATORY_CARE_PROVIDER_SITE_OTHER): Payer: BC Managed Care – PPO

## 2011-02-24 DIAGNOSIS — J309 Allergic rhinitis, unspecified: Secondary | ICD-10-CM

## 2011-03-03 ENCOUNTER — Ambulatory Visit (INDEPENDENT_AMBULATORY_CARE_PROVIDER_SITE_OTHER): Payer: BC Managed Care – PPO

## 2011-03-03 DIAGNOSIS — J309 Allergic rhinitis, unspecified: Secondary | ICD-10-CM

## 2011-03-10 ENCOUNTER — Ambulatory Visit (INDEPENDENT_AMBULATORY_CARE_PROVIDER_SITE_OTHER): Payer: BC Managed Care – PPO

## 2011-03-10 DIAGNOSIS — J309 Allergic rhinitis, unspecified: Secondary | ICD-10-CM

## 2011-03-17 ENCOUNTER — Ambulatory Visit (INDEPENDENT_AMBULATORY_CARE_PROVIDER_SITE_OTHER): Payer: BC Managed Care – PPO

## 2011-03-17 DIAGNOSIS — J309 Allergic rhinitis, unspecified: Secondary | ICD-10-CM

## 2011-03-24 ENCOUNTER — Ambulatory Visit (INDEPENDENT_AMBULATORY_CARE_PROVIDER_SITE_OTHER): Payer: BC Managed Care – PPO

## 2011-03-24 DIAGNOSIS — J309 Allergic rhinitis, unspecified: Secondary | ICD-10-CM

## 2011-03-31 ENCOUNTER — Ambulatory Visit (INDEPENDENT_AMBULATORY_CARE_PROVIDER_SITE_OTHER): Payer: BC Managed Care – PPO

## 2011-03-31 DIAGNOSIS — J309 Allergic rhinitis, unspecified: Secondary | ICD-10-CM

## 2011-04-07 ENCOUNTER — Encounter: Payer: Self-pay | Admitting: Internal Medicine

## 2011-04-07 ENCOUNTER — Ambulatory Visit (INDEPENDENT_AMBULATORY_CARE_PROVIDER_SITE_OTHER): Payer: BC Managed Care – PPO

## 2011-04-07 DIAGNOSIS — J309 Allergic rhinitis, unspecified: Secondary | ICD-10-CM

## 2011-04-08 ENCOUNTER — Ambulatory Visit: Payer: Self-pay | Admitting: Internal Medicine

## 2011-04-14 ENCOUNTER — Ambulatory Visit (INDEPENDENT_AMBULATORY_CARE_PROVIDER_SITE_OTHER): Payer: BC Managed Care – PPO

## 2011-04-14 DIAGNOSIS — J309 Allergic rhinitis, unspecified: Secondary | ICD-10-CM

## 2011-04-21 ENCOUNTER — Ambulatory Visit (INDEPENDENT_AMBULATORY_CARE_PROVIDER_SITE_OTHER): Payer: BC Managed Care – PPO

## 2011-04-21 DIAGNOSIS — J309 Allergic rhinitis, unspecified: Secondary | ICD-10-CM

## 2011-04-28 ENCOUNTER — Ambulatory Visit (INDEPENDENT_AMBULATORY_CARE_PROVIDER_SITE_OTHER): Payer: BC Managed Care – PPO

## 2011-04-28 DIAGNOSIS — J309 Allergic rhinitis, unspecified: Secondary | ICD-10-CM

## 2011-05-01 ENCOUNTER — Other Ambulatory Visit: Payer: Self-pay | Admitting: Internal Medicine

## 2011-05-05 ENCOUNTER — Ambulatory Visit (INDEPENDENT_AMBULATORY_CARE_PROVIDER_SITE_OTHER): Payer: BC Managed Care – PPO

## 2011-05-05 DIAGNOSIS — J309 Allergic rhinitis, unspecified: Secondary | ICD-10-CM

## 2011-05-12 ENCOUNTER — Ambulatory Visit (INDEPENDENT_AMBULATORY_CARE_PROVIDER_SITE_OTHER): Payer: BC Managed Care – PPO

## 2011-05-12 DIAGNOSIS — J309 Allergic rhinitis, unspecified: Secondary | ICD-10-CM

## 2011-05-19 ENCOUNTER — Ambulatory Visit (INDEPENDENT_AMBULATORY_CARE_PROVIDER_SITE_OTHER): Payer: 59 | Admitting: Internal Medicine

## 2011-05-19 ENCOUNTER — Encounter: Payer: Self-pay | Admitting: Internal Medicine

## 2011-05-19 ENCOUNTER — Ambulatory Visit (INDEPENDENT_AMBULATORY_CARE_PROVIDER_SITE_OTHER): Payer: 59

## 2011-05-19 VITALS — BP 120/76 | HR 81 | Ht 60.0 in | Wt 149.8 lb

## 2011-05-19 DIAGNOSIS — J45909 Unspecified asthma, uncomplicated: Secondary | ICD-10-CM

## 2011-05-19 DIAGNOSIS — J309 Allergic rhinitis, unspecified: Secondary | ICD-10-CM

## 2011-05-19 DIAGNOSIS — J45998 Other asthma: Secondary | ICD-10-CM

## 2011-05-19 DIAGNOSIS — J301 Allergic rhinitis due to pollen: Secondary | ICD-10-CM

## 2011-05-19 NOTE — Patient Instructions (Signed)
Please call as needed 

## 2011-05-19 NOTE — Progress Notes (Signed)
05/19/11- 65 yoF never smoker followed for allergic rhinitis, asthma LOV-04/09/10 We had called in Zpak in November and it helped. Otherwise has done "great". Continues allergy vaccine and thinks it helps her. Heartburn is controlled. Singulair helps.  ROS-see HPi Constitutional:   No-   weight loss, night sweats, fevers, chills, fatigue, lassitude. HEENT:   No-  headaches, difficulty swallowing, tooth/dental problems, sore throat,       No-  sneezing, itching, ear ache, nasal congestion, post nasal drip,  CV:  No-   chest pain, orthopnea, PND, swelling in lower extremities, anasarca, dizziness, palpitations Resp: No-   shortness of breath with exertion or at rest.              No-   productive cough,  No non-productive cough,  No- coughing up of blood.              No-   change in color of mucus.  No- wheezing.   Skin: No-   rash or lesions. GI:  No-   heartburn, indigestion, abdominal pain, nausea, vomiting, diarrhea,                 change in bowel habits, loss of appetite GU: No-   dysuria, change in color of urine, no urgency or frequency.  No- flank pain. MS:  No-   joint pain or swelling.  No- decreased range of motion.  No- back pain. Neuro-     nothing unusual Psych:  No- change in mood or affect. No depression or anxiety.  No memory loss.  General- Alert, Oriented, Affect-appropriate, Distress- none acute Skin- rash-none, lesions- none, excoriation- none Lymphadenopathy- none Head- atraumatic            Eyes- Gross vision intact, PERRLA, conjunctivae clear secretions            Ears- Hearing, canals-normal            Nose- Clear, no-Septal dev, mucus, polyps, erosion, perforation             Throat- Mallampati III , mucosa clear , drainage- none, tonsils- atrophic Neck- flexible , trachea midline, no stridor , thyroid nl, carotid no bruit Chest - symmetrical excursion , unlabored           Heart/CV- RRR , no murmur , no gallop  , no rub, nl s1 s2                           -  JVD- none , edema- none, stasis changes- none, varices- none           Lung- clear to P&A, wheeze- none, cough- none , dullness-none, rub- none           Chest wall-  Abd-  Br/ Gen/ Rectal- Not done, not indicated Extrem- cyanosis- none, clubbing, none, atrophy- none, strength- nl Neuro- grossly intact to observation

## 2011-05-21 NOTE — Assessment & Plan Note (Signed)
Her exacerbations are usually either to respiratory infections or inhalant allergy. She finds her management successful.

## 2011-05-21 NOTE — Assessment & Plan Note (Signed)
Continues with good control. She is satisfied that this helps. We have discussed environmental precautions again.

## 2011-05-24 ENCOUNTER — Ambulatory Visit (INDEPENDENT_AMBULATORY_CARE_PROVIDER_SITE_OTHER): Payer: 59

## 2011-05-24 DIAGNOSIS — R05 Cough: Secondary | ICD-10-CM

## 2011-05-24 DIAGNOSIS — R059 Cough, unspecified: Secondary | ICD-10-CM

## 2011-05-24 DIAGNOSIS — J019 Acute sinusitis, unspecified: Secondary | ICD-10-CM

## 2011-05-27 ENCOUNTER — Ambulatory Visit (INDEPENDENT_AMBULATORY_CARE_PROVIDER_SITE_OTHER): Payer: 59

## 2011-05-27 DIAGNOSIS — J309 Allergic rhinitis, unspecified: Secondary | ICD-10-CM

## 2011-06-02 ENCOUNTER — Ambulatory Visit (INDEPENDENT_AMBULATORY_CARE_PROVIDER_SITE_OTHER): Payer: 59

## 2011-06-02 DIAGNOSIS — J309 Allergic rhinitis, unspecified: Secondary | ICD-10-CM

## 2011-06-09 ENCOUNTER — Ambulatory Visit (INDEPENDENT_AMBULATORY_CARE_PROVIDER_SITE_OTHER): Payer: 59

## 2011-06-09 DIAGNOSIS — J309 Allergic rhinitis, unspecified: Secondary | ICD-10-CM

## 2011-06-18 ENCOUNTER — Ambulatory Visit (INDEPENDENT_AMBULATORY_CARE_PROVIDER_SITE_OTHER): Payer: 59

## 2011-06-18 DIAGNOSIS — J309 Allergic rhinitis, unspecified: Secondary | ICD-10-CM

## 2011-06-27 ENCOUNTER — Ambulatory Visit (INDEPENDENT_AMBULATORY_CARE_PROVIDER_SITE_OTHER): Payer: 59

## 2011-06-27 DIAGNOSIS — J309 Allergic rhinitis, unspecified: Secondary | ICD-10-CM

## 2011-07-09 ENCOUNTER — Ambulatory Visit (INDEPENDENT_AMBULATORY_CARE_PROVIDER_SITE_OTHER): Payer: 59

## 2011-07-09 DIAGNOSIS — J309 Allergic rhinitis, unspecified: Secondary | ICD-10-CM

## 2011-07-17 ENCOUNTER — Encounter: Payer: Self-pay | Admitting: Internal Medicine

## 2011-07-17 ENCOUNTER — Telehealth: Payer: Self-pay | Admitting: Internal Medicine

## 2011-07-17 MED ORDER — DOXYCYCLINE HYCLATE 100 MG PO TABS: ORAL_TABLET | ORAL | Status: AC

## 2011-07-17 NOTE — Telephone Encounter (Signed)
Called spoke with patient, advised of CDY's recs.  Pt verbalized her understanding.  Doxycycline sent to verified pharmacy.

## 2011-07-17 NOTE — Telephone Encounter (Signed)
Called spoke with patient who c/o HA, sinus pressure/congestion, yellow nasal drainage, PND, some prod cough x2days - denies f/c/s.  Requesting rx.  [facility] [address].  Last ov 1.7.13, follow up in 1 year > 1.16.13.  Allergies  Allergen Reactions  . Penicillins   . Sulfonamide Derivatives     Dr Maple Hudson please advise, thanks.

## 2011-07-17 NOTE — Telephone Encounter (Signed)
Offer doxycycline 100 mg, # 8, 2 today, then one daily Recommend mucinex and extra fluids

## 2011-07-24 ENCOUNTER — Ambulatory Visit (INDEPENDENT_AMBULATORY_CARE_PROVIDER_SITE_OTHER): Payer: 59

## 2011-07-24 DIAGNOSIS — J309 Allergic rhinitis, unspecified: Secondary | ICD-10-CM

## 2011-08-05 ENCOUNTER — Ambulatory Visit (INDEPENDENT_AMBULATORY_CARE_PROVIDER_SITE_OTHER): Payer: 59

## 2011-08-05 DIAGNOSIS — J309 Allergic rhinitis, unspecified: Secondary | ICD-10-CM

## 2011-08-19 ENCOUNTER — Ambulatory Visit (INDEPENDENT_AMBULATORY_CARE_PROVIDER_SITE_OTHER): Payer: 59

## 2011-08-19 DIAGNOSIS — J309 Allergic rhinitis, unspecified: Secondary | ICD-10-CM

## 2011-08-25 ENCOUNTER — Ambulatory Visit (INDEPENDENT_AMBULATORY_CARE_PROVIDER_SITE_OTHER): Payer: 59

## 2011-08-25 DIAGNOSIS — J309 Allergic rhinitis, unspecified: Secondary | ICD-10-CM

## 2011-09-01 ENCOUNTER — Ambulatory Visit (INDEPENDENT_AMBULATORY_CARE_PROVIDER_SITE_OTHER): Payer: 59

## 2011-09-01 DIAGNOSIS — J309 Allergic rhinitis, unspecified: Secondary | ICD-10-CM

## 2011-09-11 ENCOUNTER — Ambulatory Visit (INDEPENDENT_AMBULATORY_CARE_PROVIDER_SITE_OTHER): Payer: 59

## 2011-09-11 DIAGNOSIS — J309 Allergic rhinitis, unspecified: Secondary | ICD-10-CM

## 2011-09-22 ENCOUNTER — Ambulatory Visit (INDEPENDENT_AMBULATORY_CARE_PROVIDER_SITE_OTHER): Payer: 59

## 2011-09-22 DIAGNOSIS — J309 Allergic rhinitis, unspecified: Secondary | ICD-10-CM

## 2011-10-01 ENCOUNTER — Ambulatory Visit (INDEPENDENT_AMBULATORY_CARE_PROVIDER_SITE_OTHER): Payer: 59

## 2011-10-01 DIAGNOSIS — J309 Allergic rhinitis, unspecified: Secondary | ICD-10-CM

## 2011-10-09 ENCOUNTER — Ambulatory Visit (INDEPENDENT_AMBULATORY_CARE_PROVIDER_SITE_OTHER): Payer: 59

## 2011-10-09 DIAGNOSIS — J309 Allergic rhinitis, unspecified: Secondary | ICD-10-CM

## 2011-10-15 ENCOUNTER — Ambulatory Visit (INDEPENDENT_AMBULATORY_CARE_PROVIDER_SITE_OTHER): Payer: 59

## 2011-10-15 DIAGNOSIS — J309 Allergic rhinitis, unspecified: Secondary | ICD-10-CM

## 2011-10-24 ENCOUNTER — Ambulatory Visit (INDEPENDENT_AMBULATORY_CARE_PROVIDER_SITE_OTHER): Payer: 59

## 2011-10-24 DIAGNOSIS — J309 Allergic rhinitis, unspecified: Secondary | ICD-10-CM

## 2011-11-04 ENCOUNTER — Ambulatory Visit (INDEPENDENT_AMBULATORY_CARE_PROVIDER_SITE_OTHER): Payer: 59

## 2011-11-04 DIAGNOSIS — J309 Allergic rhinitis, unspecified: Secondary | ICD-10-CM

## 2011-11-20 ENCOUNTER — Ambulatory Visit (INDEPENDENT_AMBULATORY_CARE_PROVIDER_SITE_OTHER): Payer: 59

## 2011-11-20 DIAGNOSIS — J309 Allergic rhinitis, unspecified: Secondary | ICD-10-CM

## 2011-12-01 ENCOUNTER — Ambulatory Visit (INDEPENDENT_AMBULATORY_CARE_PROVIDER_SITE_OTHER): Payer: 59

## 2011-12-01 DIAGNOSIS — J309 Allergic rhinitis, unspecified: Secondary | ICD-10-CM

## 2011-12-10 ENCOUNTER — Ambulatory Visit (INDEPENDENT_AMBULATORY_CARE_PROVIDER_SITE_OTHER): Payer: 59

## 2011-12-10 DIAGNOSIS — J309 Allergic rhinitis, unspecified: Secondary | ICD-10-CM

## 2011-12-18 ENCOUNTER — Ambulatory Visit (INDEPENDENT_AMBULATORY_CARE_PROVIDER_SITE_OTHER): Payer: 59

## 2011-12-18 DIAGNOSIS — J309 Allergic rhinitis, unspecified: Secondary | ICD-10-CM

## 2011-12-23 ENCOUNTER — Encounter: Payer: Self-pay | Admitting: Internal Medicine

## 2011-12-29 ENCOUNTER — Ambulatory Visit (INDEPENDENT_AMBULATORY_CARE_PROVIDER_SITE_OTHER): Payer: 59

## 2011-12-29 DIAGNOSIS — J309 Allergic rhinitis, unspecified: Secondary | ICD-10-CM

## 2012-01-05 ENCOUNTER — Ambulatory Visit (INDEPENDENT_AMBULATORY_CARE_PROVIDER_SITE_OTHER): Payer: 59

## 2012-01-05 DIAGNOSIS — J309 Allergic rhinitis, unspecified: Secondary | ICD-10-CM

## 2012-01-13 ENCOUNTER — Ambulatory Visit (INDEPENDENT_AMBULATORY_CARE_PROVIDER_SITE_OTHER): Payer: 59

## 2012-01-13 DIAGNOSIS — J309 Allergic rhinitis, unspecified: Secondary | ICD-10-CM

## 2012-01-19 ENCOUNTER — Ambulatory Visit (INDEPENDENT_AMBULATORY_CARE_PROVIDER_SITE_OTHER): Payer: 59

## 2012-01-19 DIAGNOSIS — J309 Allergic rhinitis, unspecified: Secondary | ICD-10-CM

## 2012-01-30 ENCOUNTER — Ambulatory Visit (INDEPENDENT_AMBULATORY_CARE_PROVIDER_SITE_OTHER): Payer: 59

## 2012-01-30 DIAGNOSIS — J309 Allergic rhinitis, unspecified: Secondary | ICD-10-CM

## 2012-02-06 ENCOUNTER — Ambulatory Visit (INDEPENDENT_AMBULATORY_CARE_PROVIDER_SITE_OTHER): Payer: 59

## 2012-02-06 DIAGNOSIS — J309 Allergic rhinitis, unspecified: Secondary | ICD-10-CM

## 2012-02-16 ENCOUNTER — Ambulatory Visit (INDEPENDENT_AMBULATORY_CARE_PROVIDER_SITE_OTHER): Payer: 59

## 2012-02-16 DIAGNOSIS — J309 Allergic rhinitis, unspecified: Secondary | ICD-10-CM

## 2012-02-23 ENCOUNTER — Ambulatory Visit (INDEPENDENT_AMBULATORY_CARE_PROVIDER_SITE_OTHER): Payer: 59

## 2012-02-23 DIAGNOSIS — J309 Allergic rhinitis, unspecified: Secondary | ICD-10-CM

## 2012-03-01 ENCOUNTER — Ambulatory Visit (INDEPENDENT_AMBULATORY_CARE_PROVIDER_SITE_OTHER): Payer: 59

## 2012-03-01 DIAGNOSIS — J309 Allergic rhinitis, unspecified: Secondary | ICD-10-CM

## 2012-03-01 DIAGNOSIS — Z23 Encounter for immunization: Secondary | ICD-10-CM

## 2012-03-03 DIAGNOSIS — Z23 Encounter for immunization: Secondary | ICD-10-CM

## 2012-03-08 ENCOUNTER — Ambulatory Visit (INDEPENDENT_AMBULATORY_CARE_PROVIDER_SITE_OTHER): Payer: 59

## 2012-03-08 DIAGNOSIS — J309 Allergic rhinitis, unspecified: Secondary | ICD-10-CM

## 2012-03-16 ENCOUNTER — Ambulatory Visit (INDEPENDENT_AMBULATORY_CARE_PROVIDER_SITE_OTHER): Payer: 59

## 2012-03-16 DIAGNOSIS — J309 Allergic rhinitis, unspecified: Secondary | ICD-10-CM

## 2012-03-23 ENCOUNTER — Ambulatory Visit (INDEPENDENT_AMBULATORY_CARE_PROVIDER_SITE_OTHER): Payer: 59

## 2012-03-23 DIAGNOSIS — J309 Allergic rhinitis, unspecified: Secondary | ICD-10-CM

## 2012-03-25 ENCOUNTER — Ambulatory Visit (INDEPENDENT_AMBULATORY_CARE_PROVIDER_SITE_OTHER): Payer: 59

## 2012-03-25 DIAGNOSIS — J309 Allergic rhinitis, unspecified: Secondary | ICD-10-CM

## 2012-03-29 ENCOUNTER — Ambulatory Visit (INDEPENDENT_AMBULATORY_CARE_PROVIDER_SITE_OTHER): Payer: 59

## 2012-03-29 DIAGNOSIS — J309 Allergic rhinitis, unspecified: Secondary | ICD-10-CM

## 2012-04-05 ENCOUNTER — Ambulatory Visit (INDEPENDENT_AMBULATORY_CARE_PROVIDER_SITE_OTHER): Payer: 59

## 2012-04-05 DIAGNOSIS — J309 Allergic rhinitis, unspecified: Secondary | ICD-10-CM

## 2012-04-06 ENCOUNTER — Ambulatory Visit (INDEPENDENT_AMBULATORY_CARE_PROVIDER_SITE_OTHER): Payer: 59 | Admitting: Family Medicine

## 2012-04-06 ENCOUNTER — Encounter: Payer: Self-pay | Admitting: Family Medicine

## 2012-04-06 VITALS — BP 110/70 | HR 97 | Temp 97.8°F | Ht 59.0 in | Wt 149.0 lb

## 2012-04-06 DIAGNOSIS — J45998 Other asthma: Secondary | ICD-10-CM

## 2012-04-06 DIAGNOSIS — M949 Disorder of cartilage, unspecified: Secondary | ICD-10-CM

## 2012-04-06 DIAGNOSIS — M8580 Other specified disorders of bone density and structure, unspecified site: Secondary | ICD-10-CM | POA: Insufficient documentation

## 2012-04-06 DIAGNOSIS — J45909 Unspecified asthma, uncomplicated: Secondary | ICD-10-CM

## 2012-04-06 DIAGNOSIS — M899 Disorder of bone, unspecified: Secondary | ICD-10-CM

## 2012-04-06 DIAGNOSIS — E039 Hypothyroidism, unspecified: Secondary | ICD-10-CM

## 2012-04-06 DIAGNOSIS — I10 Essential (primary) hypertension: Secondary | ICD-10-CM | POA: Insufficient documentation

## 2012-04-06 NOTE — Patient Instructions (Addendum)
Follow up in 6 months to recheck BP and thyroid We'll plan on a physical at this time next year Keep up the good work- you look fantastic!!! Call with any questions or concerns Think of [place] as your home base! Welcome!  We're glad to have you! Happy Holidays!!!

## 2012-04-06 NOTE — Progress Notes (Signed)
  Subjective:    Patient ID: Alicia Wallace, female    DOB: July 03, 1945, 66 y.o.   MRN: [id number]  HPI New to establish.  PCP- none.  GYN- Taavon, Pulm- Young, Endo- Altheimer.  Hypothyroid- chronic problem, following w/ Dr Altheimer.  Seeing q3 months w/ labs.  On Synthroid daily.  Just had recent lab work.  Denies excessive fatigue, cold intolerance, dry/brittle hair nails.  Allergic Rhinitis/Asthma- getting weekly allergy shots w/ pulm.  No longer on singulair, OTC antihistamine, nasal steroid, or inhaler.  HTN- chronic problem, well controlled w/ Cozaar and Metoprolol.  Denies CP, SOB, HAs, visual changes, edema.  Osteopenia- following w/ Dr Billy Coast, on daily Ca and Vit D.  Health maintenance- UTD on mammo, DEXA, colonoscopy.   Review of Systems For ROS see HPI     Objective:   Physical Exam  Vitals reviewed. Constitutional: She is oriented to person, place, and time. She appears well-developed and well-nourished. No distress.  HENT:  Head: Normocephalic and atraumatic.  Eyes: Conjunctivae normal and EOM are normal. Pupils are equal, round, and reactive to light.  Neck: Normal range of motion. Neck supple. No thyromegaly present.  Cardiovascular: Normal rate, regular rhythm, normal heart sounds and intact distal pulses.   No murmur heard. Pulmonary/Chest: Effort normal and breath sounds normal. No respiratory distress.  Abdominal: Soft. She exhibits no distension. There is no tenderness.  Musculoskeletal: She exhibits no edema.  Lymphadenopathy:    She has no cervical adenopathy.  Neurological: She is alert and oriented to person, place, and time.  Skin: Skin is warm and dry.  Psychiatric: She has a normal mood and affect. Her behavior is normal.          Assessment & Plan:

## 2012-04-09 ENCOUNTER — Encounter: Payer: Self-pay | Admitting: Internal Medicine

## 2012-04-12 ENCOUNTER — Telehealth: Payer: Self-pay | Admitting: Internal Medicine

## 2012-04-12 ENCOUNTER — Ambulatory Visit (INDEPENDENT_AMBULATORY_CARE_PROVIDER_SITE_OTHER): Payer: 59

## 2012-04-12 DIAGNOSIS — J309 Allergic rhinitis, unspecified: Secondary | ICD-10-CM

## 2012-04-12 MED ORDER — AZITHROMYCIN 250 MG PO TABS: ORAL_TABLET | ORAL | Status: DC

## 2012-04-12 NOTE — Telephone Encounter (Signed)
Per CY-okay to give Zpak #1 take as directed no refills and take Sudafed-PE OTC for congestion.

## 2012-04-12 NOTE — Telephone Encounter (Signed)
Spoke with patient, patient requesting ZPack called in to [facility] [address].  Patient c/o sinus infection w prod cough w yellow mucus and feeling like head is "in a barrel"v x1 day. Patient reports she has tried Robitussin tablet with no improvement.  Dr. Maple Hudson please advise, thank you  Last OV:05/19/11 Next OV:05/17/12  Allergies  Allergen Reactions  . Penicillins   . Sulfonamide Derivatives

## 2012-04-12 NOTE — Telephone Encounter (Signed)
Pt aware of CDY recs. She voiced her understanding and needed nothing further 

## 2012-04-18 NOTE — Assessment & Plan Note (Signed)
New to provider.  Following w/ pulm and getting weekly allergy shots.  Will follow and assist as able.

## 2012-04-18 NOTE — Assessment & Plan Note (Signed)
New to provider, chronic for pt.  Well controlled.  Asymptomatic.  No med changes at this time.  Will continue to follow and adjust meds prn.

## 2012-04-18 NOTE — Assessment & Plan Note (Signed)
New to provider.  UTD on DEXA.  Taking Ca and Vit D daily.

## 2012-04-18 NOTE — Assessment & Plan Note (Signed)
New to provider, chronic for pt.  Has been following w/ Dr Altheimer q3 months.  Had recent lab work done.  Is considering having me follow her thyroid rather than paying specialist copay.  Will defer to her decision and proceed as she wishes.

## 2012-04-26 ENCOUNTER — Ambulatory Visit (INDEPENDENT_AMBULATORY_CARE_PROVIDER_SITE_OTHER): Payer: 59

## 2012-04-26 DIAGNOSIS — J309 Allergic rhinitis, unspecified: Secondary | ICD-10-CM

## 2012-05-06 ENCOUNTER — Ambulatory Visit (INDEPENDENT_AMBULATORY_CARE_PROVIDER_SITE_OTHER): Payer: 59

## 2012-05-06 ENCOUNTER — Other Ambulatory Visit: Payer: Self-pay | Admitting: Internal Medicine

## 2012-05-06 DIAGNOSIS — J309 Allergic rhinitis, unspecified: Secondary | ICD-10-CM

## 2012-05-06 MED ORDER — DOXYCYCLINE HYCLATE 100 MG PO TABS: ORAL_TABLET | ORAL | Status: DC

## 2012-05-06 NOTE — Telephone Encounter (Signed)
Pt stopped in of allergy injection still complaining of sinus drainage ,cough,chest congestion,sob,wheezing,denies any fever  Would like another round of abx. [facility] please call pts cell to let her know if she can pick up rx.  Allergies  Allergen Reactions  . Penicillins   . Sulfonamide Derivatives    Dr Maple Hudson please advise.  Thank you

## 2012-05-06 NOTE — Telephone Encounter (Signed)
Per CY: okay for doxycycline 100mg  #8  2 today then 1 daily.  Called spoke with patient and advised of CY's recs as stated above.  Pt okay with these recs and verbalized her understanding.  Rx sent to verified pharmacy.

## 2012-05-17 ENCOUNTER — Encounter: Payer: Self-pay | Admitting: Internal Medicine

## 2012-05-17 ENCOUNTER — Telehealth: Payer: Self-pay | Admitting: Internal Medicine

## 2012-05-17 ENCOUNTER — Ambulatory Visit (INDEPENDENT_AMBULATORY_CARE_PROVIDER_SITE_OTHER): Payer: 59

## 2012-05-17 ENCOUNTER — Ambulatory Visit (INDEPENDENT_AMBULATORY_CARE_PROVIDER_SITE_OTHER)
Admission: RE | Admit: 2012-05-17 | Discharge: 2012-05-17 | Disposition: A | Discharge status: Home / Self Care | Payer: 59 | Source: Ambulatory Visit | Attending: Internal Medicine | Admitting: Internal Medicine

## 2012-05-17 ENCOUNTER — Ambulatory Visit (INDEPENDENT_AMBULATORY_CARE_PROVIDER_SITE_OTHER): Payer: 59 | Admitting: Internal Medicine

## 2012-05-17 VITALS — BP 130/80 | HR 96 | Ht 60.0 in | Wt 153.0 lb

## 2012-05-17 DIAGNOSIS — J309 Allergic rhinitis, unspecified: Secondary | ICD-10-CM

## 2012-05-17 DIAGNOSIS — J45909 Unspecified asthma, uncomplicated: Secondary | ICD-10-CM

## 2012-05-17 DIAGNOSIS — J40 Bronchitis, not specified as acute or chronic: Secondary | ICD-10-CM

## 2012-05-17 DIAGNOSIS — J45998 Other asthma: Secondary | ICD-10-CM

## 2012-05-17 MED ORDER — CLARITHROMYCIN 500 MG PO TABS: ORAL_TABLET | ORAL | Status: DC

## 2012-05-17 NOTE — Telephone Encounter (Signed)
LM with pt to call back. We don't have this medication on her med list.

## 2012-05-17 NOTE — Patient Instructions (Addendum)
Script for biaxin sent   Suggest fluids, mucinex and maybe add a decongestant like Sudafed or Sudafed-PE  Order- CXR  Dx bronchitis  Please call as needed

## 2012-05-17 NOTE — Progress Notes (Signed)
05/19/11- 65 yoF never smoker followed for allergic rhinitis, asthma LOV-04/09/10 We had called in Zpak in November and it helped. Otherwise has done "great". Continues allergy vaccine and thinks it helps her. Heartburn is controlled. Singulair helps.  05/17/12 65 yoF never smoker followed for allergic rhinitis, asthma FOLLOWS FOR: still on weekly allergy vaccine 1:10 GH, doing well She complains of recent sinus and ear infection. Took Z-Pak and then doxycycline, finished 4 days ago. Still feels congested in her chest with yellow sputum. Ears feel "sensitive" without popping or decreased hearing. Using Mucinex.  ROS-see HPi Constitutional:   No-   weight loss, night sweats, fevers, chills, fatigue, lassitude. HEENT:   No-  headaches, difficulty swallowing, tooth/dental problems, sore throat,       No-  sneezing, itching, ear ache, nasal congestion, post nasal drip,  CV:  No-   chest pain, orthopnea, PND, swelling in lower extremities, anasarca, dizziness, palpitations Resp: No-   shortness of breath with exertion or at rest.              No-   productive cough,  No non-productive cough,  No- coughing up of blood.              No-   change in color of mucus.  No- wheezing.   Skin: No-   rash or lesions. GI:  No-   heartburn, indigestion, abdominal pain, nausea, vomiting,  GU:  MS:  No-   joint pain or swelling.  . Neuro-     nothing unusual Psych:  No- change in mood or affect. No depression or anxiety.  No memory loss.  General- Alert, Oriented, Affect-appropriate, Distress- none acute Skin- rash-none, lesions- none, excoriation- none Lymphadenopathy- none Head- atraumatic            Eyes- Gross vision intact, PERRLA, conjunctivae clear secretions            Ears- Hearing, canals-normal            Nose- + mucus bridging, no-Septal dev,polyps, erosion, perforation             Throat- Mallampati III , mucosa clear , drainage- none, tonsils- atrophic Neck- flexible , trachea midline, no  stridor , thyroid nl, carotid no bruit Chest - symmetrical excursion , unlabored           Heart/CV- RRR , no murmur , no gallop  , no rub, nl s1 s2                           - JVD- none , edema- none, stasis changes- none, varices- none           Lung- clear to P&A, wheeze- none, cough+ light , dullness-none, rub- none           Chest wall-  Abd-  Br/ Gen/ Rectal- Not done, not indicated Extrem- cyanosis- none, clubbing, none, atrophy- none, strength- nl Neuro- grossly intact to observation

## 2012-05-18 NOTE — Telephone Encounter (Signed)
Notes Recorded by Waymon Budge, MD on 05/17/2012 at 2:07 PM CXR- not a really deep inspiration, the right diaphragm rides a little high , but no obvious active process. -----  I spoke with patient about results and she verbalized understanding and had no questions. Pt did not need refill on pepcid. Nothing further was needed

## 2012-05-18 NOTE — Telephone Encounter (Signed)
Pt returned triage's call & asked to be reached at [phone].  Alicia Wallace

## 2012-05-24 ENCOUNTER — Ambulatory Visit (INDEPENDENT_AMBULATORY_CARE_PROVIDER_SITE_OTHER): Payer: 59

## 2012-05-24 DIAGNOSIS — J309 Allergic rhinitis, unspecified: Secondary | ICD-10-CM

## 2012-05-28 NOTE — Assessment & Plan Note (Signed)
Upper respiratory infection with acute bronchitis Plan-fluids, Mucinex, Sudafed, Biaxin, chest x-ray

## 2012-05-31 ENCOUNTER — Ambulatory Visit (INDEPENDENT_AMBULATORY_CARE_PROVIDER_SITE_OTHER): Payer: 59

## 2012-05-31 DIAGNOSIS — J309 Allergic rhinitis, unspecified: Secondary | ICD-10-CM

## 2012-06-07 ENCOUNTER — Ambulatory Visit (INDEPENDENT_AMBULATORY_CARE_PROVIDER_SITE_OTHER): Payer: 59

## 2012-06-07 ENCOUNTER — Ambulatory Visit: Payer: 59

## 2012-06-07 DIAGNOSIS — J309 Allergic rhinitis, unspecified: Secondary | ICD-10-CM

## 2012-06-14 ENCOUNTER — Ambulatory Visit (INDEPENDENT_AMBULATORY_CARE_PROVIDER_SITE_OTHER): Payer: 59

## 2012-06-14 ENCOUNTER — Ambulatory Visit: Payer: 59

## 2012-06-14 DIAGNOSIS — J309 Allergic rhinitis, unspecified: Secondary | ICD-10-CM

## 2012-06-21 ENCOUNTER — Ambulatory Visit: Payer: 59

## 2012-06-21 ENCOUNTER — Ambulatory Visit (INDEPENDENT_AMBULATORY_CARE_PROVIDER_SITE_OTHER): Payer: 59

## 2012-06-21 DIAGNOSIS — J309 Allergic rhinitis, unspecified: Secondary | ICD-10-CM

## 2012-06-28 ENCOUNTER — Ambulatory Visit (INDEPENDENT_AMBULATORY_CARE_PROVIDER_SITE_OTHER): Payer: 59

## 2012-06-28 ENCOUNTER — Ambulatory Visit: Payer: 59

## 2012-06-28 DIAGNOSIS — J309 Allergic rhinitis, unspecified: Secondary | ICD-10-CM

## 2012-07-05 ENCOUNTER — Encounter: Payer: Self-pay | Admitting: Internal Medicine

## 2012-07-05 ENCOUNTER — Ambulatory Visit (INDEPENDENT_AMBULATORY_CARE_PROVIDER_SITE_OTHER): Payer: 59

## 2012-07-05 ENCOUNTER — Ambulatory Visit: Payer: 59

## 2012-07-05 DIAGNOSIS — J309 Allergic rhinitis, unspecified: Secondary | ICD-10-CM

## 2012-07-12 ENCOUNTER — Ambulatory Visit: Payer: 59

## 2012-07-13 ENCOUNTER — Ambulatory Visit (INDEPENDENT_AMBULATORY_CARE_PROVIDER_SITE_OTHER): Payer: 59

## 2012-07-13 DIAGNOSIS — J309 Allergic rhinitis, unspecified: Secondary | ICD-10-CM

## 2012-07-19 ENCOUNTER — Ambulatory Visit (INDEPENDENT_AMBULATORY_CARE_PROVIDER_SITE_OTHER): Payer: 59

## 2012-07-19 ENCOUNTER — Telehealth: Payer: Self-pay | Admitting: Family Medicine

## 2012-07-19 DIAGNOSIS — J309 Allergic rhinitis, unspecified: Secondary | ICD-10-CM

## 2012-07-19 NOTE — Telephone Encounter (Signed)
Pt came in and needs refill on synthroid and BP meds she is out, please call patient, and would ike them called into [facility] on [address]

## 2012-07-20 NOTE — Telephone Encounter (Signed)
Ok for refill on all meds but will need to have upcoming OV in May to check labs

## 2012-07-20 NOTE — Telephone Encounter (Signed)
Please advice on RF request.  Pt stated that she was told that we will refill her meds, and she will not have to go to Dr. Leslie Dales every 3 mos and get her meds refilled.  We do not have any bloodwork on her.  Pt stated that her last TSH was normal but do not know when that was drawn.//AB/CMA

## 2012-07-21 MED ORDER — LOSARTAN POTASSIUM 100 MG PO TABS: 100.0000 mg | ORAL_TABLET | Freq: Every day | ORAL | Status: DC

## 2012-07-21 MED ORDER — METOPROLOL SUCCINATE ER 100 MG PO TB24: 100.0000 mg | ORAL_TABLET | Freq: Every day | ORAL | Status: DC

## 2012-07-21 MED ORDER — LEVOTHYROXINE SODIUM 100 MCG PO TABS: 100.0000 ug | ORAL_TABLET | Freq: Every day | ORAL | Status: DC

## 2012-07-21 NOTE — Telephone Encounter (Signed)
Spoke with the pt and informed her that the RF request has been approved, and sent to the pharmacy by e-script.  Informed the pt see will need to schedule an CPE in May.  Pt agreed.//AB/CMA

## 2012-07-26 ENCOUNTER — Ambulatory Visit: Payer: 59

## 2012-07-27 ENCOUNTER — Ambulatory Visit (INDEPENDENT_AMBULATORY_CARE_PROVIDER_SITE_OTHER): Payer: 59

## 2012-07-27 DIAGNOSIS — J309 Allergic rhinitis, unspecified: Secondary | ICD-10-CM

## 2012-08-02 ENCOUNTER — Ambulatory Visit (INDEPENDENT_AMBULATORY_CARE_PROVIDER_SITE_OTHER): Payer: 59

## 2012-08-02 DIAGNOSIS — J309 Allergic rhinitis, unspecified: Secondary | ICD-10-CM

## 2012-08-03 ENCOUNTER — Ambulatory Visit (INDEPENDENT_AMBULATORY_CARE_PROVIDER_SITE_OTHER): Payer: 59

## 2012-08-03 DIAGNOSIS — J309 Allergic rhinitis, unspecified: Secondary | ICD-10-CM

## 2012-08-09 ENCOUNTER — Ambulatory Visit (INDEPENDENT_AMBULATORY_CARE_PROVIDER_SITE_OTHER): Payer: 59

## 2012-08-09 DIAGNOSIS — J309 Allergic rhinitis, unspecified: Secondary | ICD-10-CM

## 2012-08-16 ENCOUNTER — Ambulatory Visit (INDEPENDENT_AMBULATORY_CARE_PROVIDER_SITE_OTHER): Payer: 59

## 2012-08-16 DIAGNOSIS — J309 Allergic rhinitis, unspecified: Secondary | ICD-10-CM

## 2012-08-24 ENCOUNTER — Ambulatory Visit (INDEPENDENT_AMBULATORY_CARE_PROVIDER_SITE_OTHER): Payer: 59

## 2012-08-24 DIAGNOSIS — J309 Allergic rhinitis, unspecified: Secondary | ICD-10-CM

## 2012-08-30 ENCOUNTER — Ambulatory Visit (INDEPENDENT_AMBULATORY_CARE_PROVIDER_SITE_OTHER): Payer: 59

## 2012-08-30 DIAGNOSIS — J309 Allergic rhinitis, unspecified: Secondary | ICD-10-CM

## 2012-09-06 ENCOUNTER — Ambulatory Visit (INDEPENDENT_AMBULATORY_CARE_PROVIDER_SITE_OTHER): Payer: 59

## 2012-09-06 DIAGNOSIS — J309 Allergic rhinitis, unspecified: Secondary | ICD-10-CM

## 2012-09-13 ENCOUNTER — Ambulatory Visit (INDEPENDENT_AMBULATORY_CARE_PROVIDER_SITE_OTHER): Payer: 59

## 2012-09-13 ENCOUNTER — Ambulatory Visit: Payer: 59 | Admitting: Internal Medicine

## 2012-09-13 DIAGNOSIS — J309 Allergic rhinitis, unspecified: Secondary | ICD-10-CM

## 2012-09-20 ENCOUNTER — Ambulatory Visit: Payer: 59

## 2012-09-22 ENCOUNTER — Ambulatory Visit (INDEPENDENT_AMBULATORY_CARE_PROVIDER_SITE_OTHER): Payer: 59

## 2012-09-22 DIAGNOSIS — J309 Allergic rhinitis, unspecified: Secondary | ICD-10-CM

## 2012-09-28 ENCOUNTER — Ambulatory Visit (INDEPENDENT_AMBULATORY_CARE_PROVIDER_SITE_OTHER): Payer: 59

## 2012-09-28 DIAGNOSIS — J309 Allergic rhinitis, unspecified: Secondary | ICD-10-CM

## 2012-10-05 ENCOUNTER — Ambulatory Visit: Payer: 59

## 2012-10-08 ENCOUNTER — Ambulatory Visit (INDEPENDENT_AMBULATORY_CARE_PROVIDER_SITE_OTHER): Payer: 59

## 2012-10-08 DIAGNOSIS — J309 Allergic rhinitis, unspecified: Secondary | ICD-10-CM

## 2012-10-11 ENCOUNTER — Ambulatory Visit: Payer: 59

## 2012-10-18 ENCOUNTER — Ambulatory Visit (INDEPENDENT_AMBULATORY_CARE_PROVIDER_SITE_OTHER): Payer: 59

## 2012-10-18 DIAGNOSIS — J309 Allergic rhinitis, unspecified: Secondary | ICD-10-CM

## 2012-10-25 ENCOUNTER — Ambulatory Visit: Payer: 59 | Admitting: Internal Medicine

## 2012-10-25 ENCOUNTER — Ambulatory Visit (INDEPENDENT_AMBULATORY_CARE_PROVIDER_SITE_OTHER): Payer: 59

## 2012-10-25 DIAGNOSIS — J309 Allergic rhinitis, unspecified: Secondary | ICD-10-CM

## 2012-11-01 ENCOUNTER — Ambulatory Visit: Payer: 59

## 2012-11-05 ENCOUNTER — Ambulatory Visit (INDEPENDENT_AMBULATORY_CARE_PROVIDER_SITE_OTHER): Payer: 59

## 2012-11-05 DIAGNOSIS — J309 Allergic rhinitis, unspecified: Secondary | ICD-10-CM

## 2012-11-15 ENCOUNTER — Ambulatory Visit (INDEPENDENT_AMBULATORY_CARE_PROVIDER_SITE_OTHER): Payer: 59

## 2012-11-15 DIAGNOSIS — J309 Allergic rhinitis, unspecified: Secondary | ICD-10-CM

## 2012-11-29 ENCOUNTER — Ambulatory Visit (INDEPENDENT_AMBULATORY_CARE_PROVIDER_SITE_OTHER): Payer: 59

## 2012-11-29 DIAGNOSIS — J309 Allergic rhinitis, unspecified: Secondary | ICD-10-CM

## 2012-11-30 ENCOUNTER — Ambulatory Visit: Payer: 59 | Admitting: Internal Medicine

## 2012-12-03 ENCOUNTER — Encounter: Payer: Self-pay | Admitting: Family Medicine

## 2012-12-06 ENCOUNTER — Ambulatory Visit: Payer: 59

## 2012-12-07 ENCOUNTER — Ambulatory Visit (INDEPENDENT_AMBULATORY_CARE_PROVIDER_SITE_OTHER): Payer: 59

## 2012-12-07 DIAGNOSIS — J309 Allergic rhinitis, unspecified: Secondary | ICD-10-CM

## 2012-12-13 ENCOUNTER — Ambulatory Visit: Payer: 59

## 2012-12-15 ENCOUNTER — Ambulatory Visit (INDEPENDENT_AMBULATORY_CARE_PROVIDER_SITE_OTHER): Payer: 59

## 2012-12-15 DIAGNOSIS — J309 Allergic rhinitis, unspecified: Secondary | ICD-10-CM

## 2012-12-22 ENCOUNTER — Ambulatory Visit: Payer: 59

## 2012-12-23 ENCOUNTER — Ambulatory Visit (INDEPENDENT_AMBULATORY_CARE_PROVIDER_SITE_OTHER): Payer: 59

## 2012-12-23 DIAGNOSIS — J309 Allergic rhinitis, unspecified: Secondary | ICD-10-CM

## 2012-12-28 ENCOUNTER — Ambulatory Visit (INDEPENDENT_AMBULATORY_CARE_PROVIDER_SITE_OTHER): Payer: 59

## 2012-12-28 DIAGNOSIS — I2789 Other specified pulmonary heart diseases: Secondary | ICD-10-CM

## 2012-12-30 ENCOUNTER — Ambulatory Visit: Payer: 59

## 2013-01-03 ENCOUNTER — Ambulatory Visit (INDEPENDENT_AMBULATORY_CARE_PROVIDER_SITE_OTHER): Payer: 59

## 2013-01-03 DIAGNOSIS — J309 Allergic rhinitis, unspecified: Secondary | ICD-10-CM

## 2013-01-12 ENCOUNTER — Ambulatory Visit (INDEPENDENT_AMBULATORY_CARE_PROVIDER_SITE_OTHER): Payer: 59

## 2013-01-12 DIAGNOSIS — J309 Allergic rhinitis, unspecified: Secondary | ICD-10-CM

## 2013-01-17 ENCOUNTER — Encounter: Payer: Self-pay | Admitting: Internal Medicine

## 2013-01-17 ENCOUNTER — Ambulatory Visit (INDEPENDENT_AMBULATORY_CARE_PROVIDER_SITE_OTHER): Payer: 59

## 2013-01-17 ENCOUNTER — Ambulatory Visit (INDEPENDENT_AMBULATORY_CARE_PROVIDER_SITE_OTHER): Payer: 59 | Admitting: Internal Medicine

## 2013-01-17 VITALS — BP 126/84 | HR 95 | Ht 59.0 in | Wt 154.0 lb

## 2013-01-17 DIAGNOSIS — J45909 Unspecified asthma, uncomplicated: Secondary | ICD-10-CM

## 2013-01-17 DIAGNOSIS — J45998 Other asthma: Secondary | ICD-10-CM

## 2013-01-17 DIAGNOSIS — Z23 Encounter for immunization: Secondary | ICD-10-CM

## 2013-01-17 DIAGNOSIS — J301 Allergic rhinitis due to pollen: Secondary | ICD-10-CM

## 2013-01-17 DIAGNOSIS — J309 Allergic rhinitis, unspecified: Secondary | ICD-10-CM

## 2013-01-17 MED ORDER — LEVOTHYROXINE SODIUM 100 MCG PO TABS: 100.0000 ug | ORAL_TABLET | Freq: Every day | ORAL | Status: DC

## 2013-01-17 NOTE — Progress Notes (Signed)
05/19/11- 65 yoF never smoker followed for allergic rhinitis, asthma LOV-04/09/10 We had called in Zpak in November and it helped. Otherwise has done "great". Continues allergy vaccine and thinks it helps her. Heartburn is controlled. Singulair helps.  05/17/12 65 yoF never smoker followed for allergic rhinitis, asthma FOLLOWS FOR: still on weekly allergy vaccine 1:10 GH, doing well She complains of recent sinus and ear infection. Took Z-Pak and then doxycycline, finished 4 days ago. Still feels congested in her chest with yellow sputum. Ears feel "sensitive" without popping or decreased hearing. Using Mucinex.  01/17/13- 67 yoF never smoker followed for allergic rhinitis, asthma FOLLOWS FOR: still on Allergy vaccine 1:10 GH and doing well; would like Flu shot today. Feels allergy vaccine has done well. Has no concerns. No recent asthma CXR 05/17/12 IMPRESSION:  Suboptimal inspiration. Mild elevation of the right hemidiaphragm.  Mild cardiomegaly.  Original Report Authenticated By: Dwyane Dee, M.D.  ROS-see HPi Constitutional:   No-   weight loss, night sweats, fevers, chills, fatigue, lassitude. HEENT:   No-  headaches, difficulty swallowing, tooth/dental problems, sore throat,       No-  sneezing, itching, ear ache, nasal congestion, post nasal drip,  CV:  No-   chest pain, orthopnea, PND, swelling in lower extremities, anasarca, dizziness, palpitations Resp: No-   shortness of breath with exertion or at rest.              No-   productive cough,  No non-productive cough,  No- coughing up of blood.              No-   change in color of mucus.  No- wheezing.   Skin: No-   rash or lesions. GI:  No-   heartburn, indigestion, abdominal pain, nausea, vomiting,  GU:  MS:  No-   joint pain or swelling.  . Neuro-     nothing unusual Psych:  No- change in mood or affect. No depression or anxiety.  No memory loss.  General- Alert, Oriented, Affect-appropriate, Distress- none acute Skin- rash-none,  lesions- none, excoriation- none Lymphadenopathy- none Head- atraumatic            Eyes- Gross vision intact, PERRLA, conjunctivae clear secretions            Ears- Hearing, canals-normal            Nose- clear, no-Septal dev,polyps, erosion, perforation             Throat- Mallampati III , mucosa clear , drainage- none, tonsils- atrophic Neck- flexible , trachea midline, no stridor , thyroid nl, carotid no bruit Chest - symmetrical excursion , unlabored           Heart/CV- RRR , no murmur , no gallop  , no rub, nl s1 s2                           - JVD- none , edema- none, stasis changes- none, varices- none           Lung- clear to P&A, wheeze- none, cough-none , dullness-none, rub- none           Chest wall-  Abd-  Br/ Gen/ Rectal- Not done, not indicated Extrem- cyanosis- none, clubbing, none, atrophy- none, strength- nl Neuro- grossly intact to observation

## 2013-01-17 NOTE — Patient Instructions (Addendum)
Script sent to maintain generic synthroid until you start with Dr Beverely Low  We can continue allergy vaccine 1:10 GH  Flu vax

## 2013-01-23 NOTE — Assessment & Plan Note (Signed)
Doing well with no reported problems. We can continue allergy vaccine.

## 2013-01-23 NOTE — Assessment & Plan Note (Signed)
Mild intermittent asthma, well-controlled 

## 2013-01-24 ENCOUNTER — Telehealth: Payer: Self-pay | Admitting: Internal Medicine

## 2013-01-24 ENCOUNTER — Ambulatory Visit (INDEPENDENT_AMBULATORY_CARE_PROVIDER_SITE_OTHER): Payer: 59

## 2013-01-24 DIAGNOSIS — J309 Allergic rhinitis, unspecified: Secondary | ICD-10-CM

## 2013-01-24 NOTE — Telephone Encounter (Signed)
Pt. Came in today telling me about the reaction she had to the flu shot last Mon.(01/17/13) It was the size of a grapefruit,red and hot,her legs hurt really bad, these symptoms lasted thru Wed.. Pt.did use a cold compress on her arm. (helped some) The pain was so bad Mon.&Tues. She stayed home from work Wed. ( Pt. Does not miss work.) She stayed off her feet and was better Thurs. And able to work. Please advise.

## 2013-01-25 NOTE — Telephone Encounter (Signed)
Pt says she's returning katie's call can be reached at [phone].Alicia Wallace

## 2013-01-25 NOTE — Telephone Encounter (Signed)
I spoke with patient; she states she was given her Flu Isla Pence) on Monday 01-17-13 here in our office. She had a reaction on her left deltoid that was red, swollen, and the size of a grapefruit. This last several days. By Thursday 01-20-13 patient stated she began to feel better again. Pt also informed me that she had a slight red area reaction last year. Pt was advised that I would speak with CY about this situation and inform her of any recs he may have.   Per CY-we will document this reaction in patients chart. Also, it will be up to patient to have another flu shot next season (weather she chooses to get at a pharmacy that may have a different kind or at her PCP). However the choice is hers.   I have documented reaction under allergy section in patients chart and patient has been made aware.

## 2013-01-31 ENCOUNTER — Ambulatory Visit: Payer: 59

## 2013-02-01 ENCOUNTER — Ambulatory Visit (INDEPENDENT_AMBULATORY_CARE_PROVIDER_SITE_OTHER): Payer: 59

## 2013-02-01 DIAGNOSIS — J309 Allergic rhinitis, unspecified: Secondary | ICD-10-CM

## 2013-02-05 ENCOUNTER — Other Ambulatory Visit: Payer: Self-pay | Admitting: Family Medicine

## 2013-02-07 ENCOUNTER — Ambulatory Visit: Payer: 59

## 2013-02-07 NOTE — Telephone Encounter (Signed)
Med filled #30 with 0 refills until pt CPE next month.

## 2013-02-08 ENCOUNTER — Ambulatory Visit (INDEPENDENT_AMBULATORY_CARE_PROVIDER_SITE_OTHER): Payer: 59

## 2013-02-08 DIAGNOSIS — J309 Allergic rhinitis, unspecified: Secondary | ICD-10-CM

## 2013-02-14 ENCOUNTER — Ambulatory Visit: Payer: 59

## 2013-02-16 ENCOUNTER — Ambulatory Visit (INDEPENDENT_AMBULATORY_CARE_PROVIDER_SITE_OTHER): Payer: 59

## 2013-02-16 ENCOUNTER — Telehealth: Payer: Self-pay

## 2013-02-16 DIAGNOSIS — J309 Allergic rhinitis, unspecified: Secondary | ICD-10-CM

## 2013-02-16 NOTE — Telephone Encounter (Signed)
LM for CB  HM reviewed.  PAP(?) CCS(?) Due as noted: PNA Zostavax

## 2013-02-17 ENCOUNTER — Encounter: Payer: Self-pay | Admitting: Family Medicine

## 2013-02-17 ENCOUNTER — Ambulatory Visit (INDEPENDENT_AMBULATORY_CARE_PROVIDER_SITE_OTHER): Payer: 59 | Admitting: Family Medicine

## 2013-02-17 VITALS — BP 126/68 | HR 102 | Temp 97.6°F | Ht 61.0 in | Wt 152.0 lb

## 2013-02-17 DIAGNOSIS — E785 Hyperlipidemia, unspecified: Secondary | ICD-10-CM

## 2013-02-17 DIAGNOSIS — Z23 Encounter for immunization: Secondary | ICD-10-CM

## 2013-02-17 DIAGNOSIS — Z0000 Encounter for general adult medical examination without abnormal findings: Secondary | ICD-10-CM

## 2013-02-17 LAB — CBC WITH DIFFERENTIAL/PLATELET
Basophils Relative: 0.5 % (ref 0.0–3.0)
Eosinophils Absolute: 0.2 10*3/uL (ref 0.0–0.7)
Eosinophils Relative: 2.2 % (ref 0.0–5.0)
Hemoglobin: 12.1 g/dL (ref 12.0–15.0)
Lymphocytes Relative: 37.4 % (ref 12.0–46.0)
MCHC: 33.6 g/dL (ref 30.0–36.0)
Monocytes Relative: 5.9 % (ref 3.0–12.0)
Neutro Abs: 4.3 10*3/uL (ref 1.4–7.7)
Neutrophils Relative %: 54 % (ref 43.0–77.0)
RBC: 4 Mil/uL (ref 3.87–5.11)
WBC: 7.9 10*3/uL (ref 4.5–10.5)

## 2013-02-17 LAB — BASIC METABOLIC PANEL
BUN: 12 mg/dL (ref 6–23)
CO2: 29 mEq/L (ref 19–32)
Chloride: 102 mEq/L (ref 96–112)
Potassium: 4.1 mEq/L (ref 3.5–5.1)

## 2013-02-17 LAB — HEPATIC FUNCTION PANEL
ALT: 21 U/L (ref 0–35)
AST: 19 U/L (ref 0–37)
Total Bilirubin: 0.3 mg/dL (ref 0.3–1.2)
Total Protein: 7.1 g/dL (ref 6.0–8.3)

## 2013-02-17 LAB — LIPID PANEL
Cholesterol: 227 mg/dL — ABNORMAL HIGH (ref 0–200)
HDL: 44.6 mg/dL (ref >39.00)
Total CHOL/HDL Ratio: 5
VLDL: 43.4 mg/dL — ABNORMAL HIGH (ref 0.0–40.0)

## 2013-02-17 LAB — TSH: TSH: 0.11 u[IU]/mL — ABNORMAL LOW (ref 0.35–5.50)

## 2013-02-17 MED ORDER — LEVOTHYROXINE SODIUM 100 MCG PO TABS: 100.0000 ug | ORAL_TABLET | Freq: Every day | ORAL | Status: DC

## 2013-02-17 MED ORDER — LOSARTAN POTASSIUM 100 MG PO TABS: ORAL_TABLET | ORAL | Status: DC

## 2013-02-17 NOTE — Assessment & Plan Note (Signed)
Pt's PE WNL.  UTD on health maintenance.  Pneumovax given today.  Check labs.  Anticipatory guidance provided.

## 2013-02-17 NOTE — Progress Notes (Signed)
  Subjective:    Patient ID: Alicia Wallace, female    DOB: 12-19-1945, 67 y.o.   MRN: [id number]  HPI CPE- UTD on mammo, no need for paps due to hysterectomy.  UTD on colonoscopy Alicia Wallace 2009). DEXA at [facility] in 2013.   Review of Systems Patient reports no vision/ hearing changes, adenopathy,fever, weight change,  persistant/recurrent hoarseness , swallowing issues, chest pain, palpitations, edema, persistant/recurrent cough, hemoptysis, dyspnea (rest/exertional/paroxysmal nocturnal), gastrointestinal bleeding (melena, rectal bleeding), abdominal pain, significant heartburn, bowel changes, GU symptoms (dysuria, hematuria, incontinence), Gyn symptoms (abnormal  bleeding, pain),  syncope, focal weakness, memory loss, numbness & tingling, skin/hair/nail changes, abnormal bruising or bleeding, anxiety, or depression.     Objective:   Physical Exam General Appearance:    Alert, cooperative, no distress, appears stated age  Head:    Normocephalic, without obvious abnormality, atraumatic  Eyes:    PERRL, conjunctiva/corneas clear, EOM's intact, fundi    benign, both eyes  Ears:    Normal TM's and external ear canals, both ears  Nose:   Nares normal, septum midline, mucosa normal, no drainage    or sinus tenderness  Throat:   Lips, mucosa, and tongue normal; teeth and gums normal  Neck:   Supple, symmetrical, trachea midline, no adenopathy;    Thyroid: no enlargement/tenderness/nodules  Back:     Symmetric, no curvature, ROM normal, no CVA tenderness  Lungs:     Clear to auscultation bilaterally, respirations unlabored  Chest Wall:    No tenderness or deformity   Heart:    Regular rate and rhythm, S1 and S2 normal, no murmur, rub   or gallop  Breast Exam:    Deferred to mammo  Abdomen:     Soft, non-tender, bowel sounds active all four quadrants,    no masses, no organomegaly  Genitalia:    Deferred  Rectal:    Extremities:   Extremities normal, atraumatic, no cyanosis or edema  Pulses:   2+  and symmetric all extremities  Skin:   Skin color, texture, turgor normal, no rashes or lesions  Lymph nodes:   Cervical, supraclavicular, and axillary nodes normal  Neurologic:   CNII-XII intact, normal strength, sensation and reflexes    throughout          Assessment & Plan:

## 2013-02-17 NOTE — Patient Instructions (Signed)
Follow up in 6 months to recheck BP We'll notify you of your lab results and make any changes if needed Keep up the good work! Call with any questions or concern Happy Fall!!!

## 2013-02-17 NOTE — Addendum Note (Signed)
Addended by: Jackson Latino on: 02/17/2013 09:14 AM   Modules accepted: Orders, Medications

## 2013-02-18 MED ORDER — ATORVASTATIN CALCIUM 20 MG PO TABS: 20.0000 mg | ORAL_TABLET | Freq: Every day | ORAL | Status: DC

## 2013-02-18 MED ORDER — LEVOTHYROXINE SODIUM 88 MCG PO TABS: 88.0000 ug | ORAL_TABLET | Freq: Every day | ORAL | Status: DC

## 2013-02-18 NOTE — Addendum Note (Signed)
Addended by: Lorene Dy L on: 02/18/2013 08:30 AM   Modules accepted: Orders

## 2013-02-18 NOTE — Telephone Encounter (Signed)
Unable to reach prior to visit  

## 2013-02-21 LAB — VITAMIN D 1,25 DIHYDROXY
Vitamin D 1, 25 (OH)2 Total: 63 pg/mL (ref 18–72)
Vitamin D2 1, 25 (OH)2: <8 pg/mL
Vitamin D3 1, 25 (OH)2: 63 pg/mL

## 2013-02-22 ENCOUNTER — Encounter: Payer: Self-pay | Admitting: General Practice

## 2013-02-28 ENCOUNTER — Ambulatory Visit: Payer: 59

## 2013-03-01 ENCOUNTER — Ambulatory Visit (INDEPENDENT_AMBULATORY_CARE_PROVIDER_SITE_OTHER): Payer: 59

## 2013-03-01 DIAGNOSIS — J309 Allergic rhinitis, unspecified: Secondary | ICD-10-CM

## 2013-03-07 ENCOUNTER — Ambulatory Visit: Payer: 59

## 2013-03-11 ENCOUNTER — Ambulatory Visit (INDEPENDENT_AMBULATORY_CARE_PROVIDER_SITE_OTHER): Payer: 59

## 2013-03-11 DIAGNOSIS — J309 Allergic rhinitis, unspecified: Secondary | ICD-10-CM

## 2013-03-21 ENCOUNTER — Ambulatory Visit (INDEPENDENT_AMBULATORY_CARE_PROVIDER_SITE_OTHER): Payer: 59

## 2013-03-21 DIAGNOSIS — J309 Allergic rhinitis, unspecified: Secondary | ICD-10-CM

## 2013-03-28 ENCOUNTER — Ambulatory Visit: Payer: 59

## 2013-04-11 ENCOUNTER — Other Ambulatory Visit (INDEPENDENT_AMBULATORY_CARE_PROVIDER_SITE_OTHER): Payer: 59

## 2013-04-11 DIAGNOSIS — E785 Hyperlipidemia, unspecified: Secondary | ICD-10-CM

## 2013-04-11 LAB — HEPATIC FUNCTION PANEL
ALT: 41 U/L — ABNORMAL HIGH (ref 0–35)
Alkaline Phosphatase: 71 U/L (ref 39–117)
Bilirubin, Direct: 0 mg/dL (ref 0.0–0.3)
Total Bilirubin: 0.2 mg/dL — ABNORMAL LOW (ref 0.3–1.2)
Total Protein: 6.8 g/dL (ref 6.0–8.3)

## 2013-04-12 ENCOUNTER — Other Ambulatory Visit: Payer: Self-pay | Admitting: Family Medicine

## 2013-04-12 DIAGNOSIS — R945 Abnormal results of liver function studies: Secondary | ICD-10-CM

## 2013-04-18 ENCOUNTER — Ambulatory Visit (INDEPENDENT_AMBULATORY_CARE_PROVIDER_SITE_OTHER): Payer: 59

## 2013-04-18 ENCOUNTER — Telehealth: Payer: Self-pay | Admitting: Internal Medicine

## 2013-04-18 DIAGNOSIS — J309 Allergic rhinitis, unspecified: Secondary | ICD-10-CM

## 2013-04-18 MED ORDER — CLARITHROMYCIN 500 MG PO TABS: 500.0000 mg | ORAL_TABLET | Freq: Two times a day (BID) | ORAL | Status: DC

## 2013-04-18 NOTE — Telephone Encounter (Signed)
I called and spoke with pt. She c/o sore throat, head cong, cough w/ yellow phlem, PND, HA x yesterday. She is requesting to have something called in. Please advise Dr. Maple Hudson thanks  Allergies  Allergen Reactions  . Influenza Vac Split Quad Other (See Comments)    Size of grapefruit red, swollen area at injection site  Leg cramps  Vaccine given was Fluarix Quadrivalent 2014/2015 formula by [employer]  . Penicillins   . Sulfonamide Derivatives

## 2013-04-18 NOTE — Telephone Encounter (Signed)
Returning call can be reached at [phone].Alicia Wallace

## 2013-04-18 NOTE — Telephone Encounter (Signed)
Offer biaxin 500 mg, # 20, 1 twice daily after meals

## 2013-04-18 NOTE — Telephone Encounter (Signed)
Pt aware of recs and rx sent. Nothing further needed 

## 2013-04-18 NOTE — Telephone Encounter (Signed)
lmomtcb x1 for pt 

## 2013-04-19 LAB — HM DEXA SCAN

## 2013-04-25 ENCOUNTER — Ambulatory Visit: Payer: 59

## 2013-04-26 ENCOUNTER — Other Ambulatory Visit (INDEPENDENT_AMBULATORY_CARE_PROVIDER_SITE_OTHER): Payer: 59

## 2013-04-26 DIAGNOSIS — R7989 Other specified abnormal findings of blood chemistry: Secondary | ICD-10-CM

## 2013-04-26 DIAGNOSIS — R945 Abnormal results of liver function studies: Secondary | ICD-10-CM

## 2013-04-26 LAB — HEPATIC FUNCTION PANEL
AST: 31 U/L (ref 0–37)
Alkaline Phosphatase: 102 U/L (ref 39–117)
Bilirubin, Direct: 0 mg/dL (ref 0.0–0.3)
Total Bilirubin: 0.5 mg/dL (ref 0.3–1.2)

## 2013-04-27 ENCOUNTER — Encounter: Payer: Self-pay | Admitting: General Practice

## 2013-04-28 ENCOUNTER — Ambulatory Visit (INDEPENDENT_AMBULATORY_CARE_PROVIDER_SITE_OTHER): Payer: 59

## 2013-04-28 DIAGNOSIS — J309 Allergic rhinitis, unspecified: Secondary | ICD-10-CM

## 2013-05-25 ENCOUNTER — Other Ambulatory Visit: Payer: Self-pay | Admitting: Family Medicine

## 2013-05-26 NOTE — Telephone Encounter (Signed)
Med filled.  

## 2013-06-01 ENCOUNTER — Ambulatory Visit (INDEPENDENT_AMBULATORY_CARE_PROVIDER_SITE_OTHER): Payer: 59

## 2013-06-01 DIAGNOSIS — J309 Allergic rhinitis, unspecified: Secondary | ICD-10-CM

## 2013-06-10 ENCOUNTER — Other Ambulatory Visit: Payer: Self-pay | Admitting: Family Medicine

## 2013-06-10 NOTE — Telephone Encounter (Signed)
Med filled.  

## 2013-07-05 ENCOUNTER — Encounter: Payer: Self-pay | Admitting: General Practice

## 2013-07-28 ENCOUNTER — Other Ambulatory Visit: Payer: Self-pay | Admitting: Family Medicine

## 2013-07-29 MED ORDER — LEVOTHYROXINE SODIUM 88 MCG PO TABS
88.0000 ug | ORAL_TABLET | Freq: Every day | ORAL | Status: DC
Start: 2013-07-29 — End: 2013-11-30

## 2013-08-09 ENCOUNTER — Other Ambulatory Visit: Payer: Self-pay | Admitting: Family Medicine

## 2013-08-10 NOTE — Telephone Encounter (Signed)
Med filled.  

## 2013-08-18 ENCOUNTER — Ambulatory Visit: Payer: 59 | Admitting: Family Medicine

## 2013-08-19 ENCOUNTER — Telehealth: Payer: Self-pay | Admitting: Internal Medicine

## 2013-08-19 ENCOUNTER — Ambulatory Visit: Payer: 59 | Admitting: Family Medicine

## 2013-08-19 MED ORDER — AZITHROMYCIN 250 MG PO TABS: ORAL_TABLET | ORAL | Status: DC

## 2013-08-19 NOTE — Telephone Encounter (Signed)
Suggest Allegra-D from pharmacy for a few days. Offer Zpak

## 2013-08-19 NOTE — Telephone Encounter (Signed)
Spoke with pt.  C/o nasal congestion, headaches, sneezing, sinus drainage (yellow), prod cough (yellow).  Denies fever.  Please advise   Allergies  Allergen Reactions  . Influenza Vac Split Quad Other (See Comments)    Size of grapefruit red, swollen area at injection site  Leg cramps  Vaccine given was Fluarix Quadrivalent 2014/2015 formula by [employer]  . Penicillins   . Sulfonamide Derivatives    Current Outpatient Prescriptions on File Prior to Visit  Medication Sig Dispense Refill  . albuterol (PROVENTIL HFA;VENTOLIN HFA) 108 (90 BASE) MCG/ACT inhaler Inhale 2 puffs into the lungs every 6 (six) hours as needed.        Marland Kitchen. atorvastatin (LIPITOR) 20 MG tablet Take 1 tablet (20 mg total) by mouth daily.  90 tablet  3  . Calcium Citrate-Vitamin D (CITRUS CALCIUM 1500 + D PO) Take by mouth.      . Cholecalciferol (VITAMIN D3) 2000 UNITS TABS Take by mouth.      . clarithromycin (BIAXIN) 500 MG tablet Take 1 tablet (500 mg total) by mouth 2 (two) times daily.  20 tablet  0  . EPINEPHrine (EPIPEN 2-PAK) 0.3 mg/0.3 mL DEVI Inject 0.3 mg into the muscle as directed.        Marland Kitchen. estradiol (VIVELLE-DOT) 0.025 MG/24HR Place 1 patch onto the skin 2 (two) times a week.      . levothyroxine (SYNTHROID, LEVOTHROID) 88 MCG tablet Take 1 tablet (88 mcg total) by mouth daily.  30 tablet  3  . losartan (COZAAR) 100 MG tablet TAKE ONE TABLET BY MOUTH ONE TIME DAILY   30 tablet  1  . metoprolol succinate (TOPROL-XL) 100 MG 24 hr tablet Take 1 tablet (100 mg total) by mouth daily.  30 tablet  5  . zolpidem (AMBIEN) 10 MG tablet Take 1 tablet by mouth at bedtime as needed.       No current facility-administered medications on file prior to visit.

## 2013-08-19 NOTE — Telephone Encounter (Signed)
i called and made pt aware of recs. rx called in

## 2013-08-22 ENCOUNTER — Encounter: Payer: Self-pay | Admitting: Family Medicine

## 2013-08-22 ENCOUNTER — Ambulatory Visit (INDEPENDENT_AMBULATORY_CARE_PROVIDER_SITE_OTHER): Payer: 59 | Admitting: Family Medicine

## 2013-08-22 VITALS — BP 128/80 | HR 92 | Temp 98.2°F | Resp 17 | Wt 149.1 lb

## 2013-08-22 DIAGNOSIS — I10 Essential (primary) hypertension: Secondary | ICD-10-CM

## 2013-08-22 DIAGNOSIS — E785 Hyperlipidemia, unspecified: Secondary | ICD-10-CM | POA: Insufficient documentation

## 2013-08-22 DIAGNOSIS — E039 Hypothyroidism, unspecified: Secondary | ICD-10-CM

## 2013-08-22 LAB — LIPID PANEL
CHOL/HDL RATIO: 5
CHOLESTEROL: 213 mg/dL — AB (ref 0–200)
HDL: 41 mg/dL (ref >39.00)
LDL CALC: 137 mg/dL — AB (ref 0–99)
Triglycerides: 177 mg/dL — ABNORMAL HIGH (ref 0.0–149.0)
VLDL: 35.4 mg/dL (ref 0.0–40.0)

## 2013-08-22 LAB — BASIC METABOLIC PANEL
BUN: 12 mg/dL (ref 6–23)
CO2: 26 mEq/L (ref 19–32)
Calcium: 9.7 mg/dL (ref 8.4–10.5)
Chloride: 102 mEq/L (ref 96–112)
Creatinine, Ser: 0.6 mg/dL (ref 0.4–1.2)
GFR: 106.29 mL/min (ref >60.00)
Glucose, Bld: 80 mg/dL (ref 70–99)
POTASSIUM: 3.9 meq/L (ref 3.5–5.1)
SODIUM: 139 meq/L (ref 135–145)

## 2013-08-22 LAB — HEPATIC FUNCTION PANEL
ALBUMIN: 3.7 g/dL (ref 3.5–5.2)
ALK PHOS: 74 U/L (ref 39–117)
ALT: 24 U/L (ref 0–35)
AST: 26 U/L (ref 0–37)
BILIRUBIN TOTAL: 0.3 mg/dL (ref 0.3–1.2)
Bilirubin, Direct: 0 mg/dL (ref 0.0–0.3)
Total Protein: 7.3 g/dL (ref 6.0–8.3)

## 2013-08-22 LAB — TSH: TSH: 0.58 u[IU]/mL (ref 0.35–5.50)

## 2013-08-22 NOTE — Progress Notes (Signed)
   Subjective:    Patient ID: Alicia Wallace, female    DOB: 1946/01/01, 68 y.o.   MRN: [id number]  HPI Hyperlipidemia- new at last OV.  Started on Lipitor.  No abd pain, N/V, myalgias.  HTN- chronic problem, on Losartan and Metoprolol.  Well controlled.  No CP, mild SOB due to current sinusitis, + sinus HAs, no visual changes.  Hypothyroid- chronic problem.  TSH was low at last visit and dose was decreased to 88mcg.  Due for repeat labs.  Denies excessive fatigue.   Review of Systems For ROS see HPI     Objective:   Physical Exam  Vitals reviewed. Constitutional: She is oriented to person, place, and time. She appears well-developed and well-nourished. No distress.  HENT:  Head: Normocephalic and atraumatic.  Eyes: Conjunctivae and EOM are normal. Pupils are equal, round, and reactive to light.  Neck: Normal range of motion. Neck supple. No thyromegaly present.  Cardiovascular: Normal rate, regular rhythm, normal heart sounds and intact distal pulses.   No murmur heard. Pulmonary/Chest: Effort normal and breath sounds normal. No respiratory distress.  Abdominal: Soft. She exhibits no distension. There is no tenderness.  Musculoskeletal: She exhibits no edema.  Lymphadenopathy:    She has no cervical adenopathy.  Neurological: She is alert and oriented to person, place, and time.  Skin: Skin is warm and dry.  Psychiatric: She has a normal mood and affect. Her behavior is normal.          Assessment & Plan:

## 2013-08-22 NOTE — Assessment & Plan Note (Signed)
Chronic problem.  Well controlled today.  Asymptomatic.  Check labs.  No anticipated med changes. 

## 2013-08-22 NOTE — Assessment & Plan Note (Signed)
New at last visit.  Tolerating statin w/o difficulty.  Check labs.  Adjust meds prn

## 2013-08-22 NOTE — Progress Notes (Signed)
Pre visit review using our clinic review tool, if applicable. No additional management support is needed unless otherwise documented below in the visit note. 

## 2013-08-22 NOTE — Assessment & Plan Note (Signed)
Chronic problem.  meds were decreased at last visit.  Currently asymptomatic.  Check labs.  Adjust meds prn.

## 2013-08-22 NOTE — Patient Instructions (Signed)
Schedule your complete physical in 6 months We'll notify you of your lab results and make any changes if needed Keep up the good work- you look great!! Call with any questions or concerns Happy Spring!!!

## 2013-08-23 ENCOUNTER — Telehealth: Payer: Self-pay | Admitting: Family Medicine

## 2013-08-23 ENCOUNTER — Encounter: Payer: Self-pay | Admitting: General Practice

## 2013-08-23 NOTE — Telephone Encounter (Signed)
Relevant patient education assigned to patient using Emmi. ° °

## 2013-10-04 LAB — HM MAMMOGRAPHY: HM Mammogram: NORMAL

## 2013-10-10 ENCOUNTER — Other Ambulatory Visit: Payer: Self-pay | Admitting: Family Medicine

## 2013-10-10 NOTE — Telephone Encounter (Signed)
Med filled.  

## 2013-10-27 ENCOUNTER — Telehealth: Payer: Self-pay | Admitting: Internal Medicine

## 2013-10-27 MED ORDER — AZITHROMYCIN 250 MG PO TABS: ORAL_TABLET | ORAL | Status: DC

## 2013-10-27 NOTE — Telephone Encounter (Signed)
LMTC x 1 Notify pt that rx for Zpak was sent to [facility] on [address]

## 2013-10-27 NOTE — Telephone Encounter (Signed)
Offer Zpak 

## 2013-10-27 NOTE — Telephone Encounter (Signed)
I called spoke with pt. She c/o sneezing, HA, prod cough yellow phlem, PND, nasal congestion x yesterday. Taking mucinex OTC.  No f/c/s/n/v. [facility] [address]. Please advise Dr. Maple HudsonYoung thanks  Allergies  Allergen Reactions  . Influenza Vac Split Quad Other (See Comments)    Size of grapefruit red, swollen area at injection site  Leg cramps  Vaccine given was Fluarix Quadrivalent 2014/2015 formula by [employer]  . Penicillins   . Sulfonamide Derivatives      Current Outpatient Prescriptions on File Prior to Visit  Medication Sig Dispense Refill  . atorvastatin (LIPITOR) 20 MG tablet Take 1 tablet (20 mg total) by mouth daily.  90 tablet  3  . azithromycin (ZITHROMAX) 250 MG tablet Take as directed  6 tablet  0  . Calcium Citrate-Vitamin D (CITRUS CALCIUM 1500 + D PO) Take by mouth.      . Cholecalciferol (VITAMIN D3) 2000 UNITS TABS Take by mouth.      . clarithromycin (BIAXIN) 500 MG tablet Take 1 tablet (500 mg total) by mouth 2 (two) times daily.  20 tablet  0  . EPINEPHrine (EPIPEN 2-PAK) 0.3 mg/0.3 mL DEVI Inject 0.3 mg into the muscle as directed.        Marland Kitchen. estradiol (VIVELLE-DOT) 0.025 MG/24HR Place 1 patch onto the skin 2 (two) times a week.      . levothyroxine (SYNTHROID, LEVOTHROID) 88 MCG tablet Take 1 tablet (88 mcg total) by mouth daily.  30 tablet  3  . losartan (COZAAR) 100 MG tablet TAKE ONE TABLET BY MOUTH ONE TIME DAILY   30 tablet  6  . metoprolol succinate (TOPROL-XL) 100 MG 24 hr tablet Take 1 tablet (100 mg total) by mouth daily.  30 tablet  5  . nortriptyline (PAMELOR) 10 MG capsule Take 2 capsules by mouth daily.      Marland Kitchen. zolpidem (AMBIEN) 10 MG tablet Take 1 tablet by mouth at bedtime as needed.       No current facility-administered medications on file prior to visit.

## 2013-10-27 NOTE — Telephone Encounter (Signed)
Spoke with pt and notified that rx for Zpak was sent to pharmacy

## 2013-11-04 ENCOUNTER — Telehealth: Payer: Self-pay | Admitting: Internal Medicine

## 2013-11-04 MED ORDER — CLARITHROMYCIN 500 MG PO TABS: 500.0000 mg | ORAL_TABLET | Freq: Two times a day (BID) | ORAL | Status: DC

## 2013-11-04 NOTE — Telephone Encounter (Signed)
Per CDY: Biaxin 500mg  1 po BID #20 x 0RF Sent to [facility] [address]  LMTCB x 1

## 2013-11-04 NOTE — Telephone Encounter (Signed)
On 10/27/13 a Z-pak was called in for pt.  Pt reports that she is still having a lot of sinus and chest congestion, cough with yellow mucus. States also has fever, chills and sweats. Pt request that CY send something stronger then z-pak to pharm. CDY please advise, thanks

## 2013-11-04 NOTE — Telephone Encounter (Signed)
Pt returned call & can be reached at [phone].  Alicia Wallace

## 2013-11-04 NOTE — Telephone Encounter (Signed)
Spoke with pt Aware that abx sent to pharmacy. Nothing further needed.

## 2013-11-30 ENCOUNTER — Other Ambulatory Visit: Payer: Self-pay | Admitting: Family Medicine

## 2013-11-30 NOTE — Telephone Encounter (Signed)
Med filled.  

## 2013-12-01 ENCOUNTER — Telehealth: Payer: Self-pay | Admitting: Internal Medicine

## 2013-12-01 ENCOUNTER — Encounter: Payer: Self-pay | Admitting: Internal Medicine

## 2013-12-01 NOTE — Telephone Encounter (Signed)
Per pt she has [employer]. Called them and was told she needs a letter of appeals for her allergy vaccines. Pt reports her insurance is only covering very little.  Fax #: [phone] Appeals dept [address] [address] [place] [phone]  Please advise Dr. Maple HudsonYoung thanks

## 2013-12-01 NOTE — Telephone Encounter (Signed)
Done

## 2013-12-06 NOTE — Telephone Encounter (Signed)
Florentina AddisonKatie has this been taken care of? thanks

## 2013-12-06 NOTE — Telephone Encounter (Signed)
[facility] has [facility] fax[facility] and [facility] Pt aware of this.

## 2014-01-17 ENCOUNTER — Ambulatory Visit (INDEPENDENT_AMBULATORY_CARE_PROVIDER_SITE_OTHER): Payer: 59 | Admitting: Internal Medicine

## 2014-01-17 ENCOUNTER — Telehealth: Payer: Self-pay | Admitting: Internal Medicine

## 2014-01-17 ENCOUNTER — Encounter: Payer: Self-pay | Admitting: Internal Medicine

## 2014-01-17 VITALS — BP 112/80 | HR 99 | Ht 59.0 in | Wt 152.0 lb

## 2014-01-17 DIAGNOSIS — J301 Allergic rhinitis due to pollen: Secondary | ICD-10-CM

## 2014-01-17 DIAGNOSIS — J45998 Other asthma: Secondary | ICD-10-CM

## 2014-01-17 DIAGNOSIS — J45909 Unspecified asthma, uncomplicated: Secondary | ICD-10-CM

## 2014-01-17 NOTE — Telephone Encounter (Signed)
Papers and message on CY's cart.

## 2014-01-17 NOTE — Assessment & Plan Note (Signed)
Need to avoid over use of decongestant nasal sprays as discussed. Plan-try adding Flonase and continuing  Allegra. We will review her insurance letter to understand what they are asking

## 2014-01-17 NOTE — Assessment & Plan Note (Signed)
controlled 

## 2014-01-17 NOTE — Progress Notes (Signed)
05/19/11- 65 yoF never smoker followed for allergic rhinitis, asthma LOV-04/09/10 We had called in Zpak in November and it helped. Otherwise has done "great". Continues allergy vaccine and thinks it helps her. Heartburn is controlled. Singulair helps.  05/17/12 65 yoF never smoker followed for allergic rhinitis, asthma FOLLOWS FOR: still on weekly allergy vaccine 1:10 GH, doing well She complains of recent sinus and ear infection. Took Z-Pak and then doxycycline, finished 4 days ago. Still feels congested in her chest with yellow sputum. Ears feel "sensitive" without popping or decreased hearing. Using Mucinex.  01/17/13- 67 yoF never smoker followed for allergic rhinitis, asthma FOLLOWS FOR: still on Allergy vaccine 1:10 GH and doing well; would like Flu shot today. Feels allergy vaccine has done well. Has no concerns. No recent asthma CXR 05/17/12 IMPRESSION:  Suboptimal inspiration. Mild elevation of the right hemidiaphragm.  Mild cardiomegaly.  Original Report Authenticated By: Dwyane Dee, M.D.  01/17/14-68 yoF never smoker followed for allergic rhinitis, asthma FOLLOWS FOR: Has been off vaccine since 03-2013 because of insurance change-can tell that she has been off of them; however  needs our assistance with insurance in getting her back on vaccine. She can't afford her share of the cost of allergy vaccine after insurance. She very much wants to restart vaccine if something can be worked out. Has perennial sneeze, itch and drainage. Had sinusitis twice during the springtime which is worse than her usual. She has been using Allegra and Mucinex. Also using Afrin which we discussed. She has another letter from her insurance that she is going to bring for [place] to see. Has had flu shot.  ROS-see HPi Constitutional:   No-   weight loss, night sweats, fevers, chills, fatigue, lassitude. HEENT:   No-  headaches, difficulty swallowing, tooth/dental problems, sore throat,       No-  +Sneezing,+ itching,  ear ache, +nasal congestion, +post nasal drip,  CV:  No-   chest pain, orthopnea, PND, swelling in lower extremities, anasarca, dizziness, palpitations Resp: No-   shortness of breath with exertion or at rest.              No-   productive cough,  No non-productive cough,  No- coughing up of blood.              No-   change in color of mucus.  No- wheezing.   Skin: No-   rash or lesions. GI:  No-   heartburn, indigestion, abdominal pain, nausea, vomiting,  GU:  MS:  No-   joint pain or swelling.  . Neuro-     nothing unusual Psych:  No- change in mood or affect. No depression or anxiety.  No memory loss.  General- Alert, Oriented, Affect-appropriate, Distress- none acute Skin- rash-none, lesions- none, excoriation- none Lymphadenopathy- none Head- atraumatic            Eyes- Gross vision intact, PERRLA, conjunctivae clear secretions            Ears- Hearing, canals-normal            Nose- + mild turbinate edema, no-Septal dev,polyps, erosion, perforation             Throat- Mallampati IV , mucosa clear , drainage- none, tonsils- atrophic Neck- flexible , trachea midline, no stridor , thyroid nl, carotid no bruit Chest - symmetrical excursion , unlabored           Heart/CV- RRR , no murmur , no gallop  , no rub, nl  s1 s2                           - JVD- none , edema- none, stasis changes- none, varices- none           Lung- clear to P&A, wheeze- none, cough-none , dullness-none, rub- none           Chest wall-  Abd-  Br/ Gen/ Rectal- Not done, not indicated Extrem- cyanosis- none, clubbing, none, atrophy- none, strength- nl Neuro- grossly intact to observation

## 2014-01-17 NOTE — Patient Instructions (Signed)
Ok to continue an antihistamine like Allegra while needed  Suggest you add otc Flonase/ fluticasone nasal spray   1-2 puffs each nostril once daily at bedtime  If you can get a copy of the insurance company letter to my attention here at the office, we will see what we can do about the allergy shots.

## 2014-01-19 NOTE — Telephone Encounter (Signed)
I have pulled the appropriate records. Need to see that we include the data the insurance company requests.

## 2014-01-19 NOTE — Telephone Encounter (Signed)
[name], please advise on the status of forms. Thanks.

## 2014-01-27 NOTE — Telephone Encounter (Signed)
[name], please advise on status of forms. Thanks.  

## 2014-02-02 NOTE — Telephone Encounter (Signed)
CY please advise where we stand on this matter. Thanks.

## 2014-02-06 ENCOUNTER — Telehealth: Payer: Self-pay | Admitting: Family Medicine

## 2014-02-06 MED ORDER — METOPROLOL SUCCINATE ER 100 MG PO TB24: 100.0000 mg | ORAL_TABLET | Freq: Every day | ORAL | Status: DC

## 2014-02-06 NOTE — Telephone Encounter (Signed)
Caller name:Bartolomei Leighton Relation to ZO:XWRU Call back number:[phone] Pharmacy:[facility] [address]  Reason for call: pt is needing new rx metoprolol succinate (TOPROL-XL) 100 MG 24 hr tablet .

## 2014-02-06 NOTE — Telephone Encounter (Signed)
Med filled.  

## 2014-02-10 NOTE — Telephone Encounter (Signed)
Papers given to Florentina AddisonKatie- records request

## 2014-02-13 NOTE — Telephone Encounter (Signed)
I have fax[facility] forms [facility] [employer] [employer] [facility] [facility]. Insurance will contact patient and us with any updates.

## 2014-02-21 ENCOUNTER — Encounter: Payer: 59 | Admitting: Family Medicine

## 2014-03-01 ENCOUNTER — Other Ambulatory Visit: Payer: Self-pay | Admitting: Family Medicine

## 2014-03-01 NOTE — Telephone Encounter (Signed)
Med filled.  

## 2014-03-24 ENCOUNTER — Encounter: Payer: Self-pay | Admitting: Internal Medicine

## 2014-03-29 ENCOUNTER — Telehealth: Payer: Self-pay | Admitting: Internal Medicine

## 2014-03-29 MED ORDER — AZITHROMYCIN 250 MG PO TABS: ORAL_TABLET | ORAL | Status: DC

## 2014-03-29 NOTE — Telephone Encounter (Signed)
Ok Zpak

## 2014-03-29 NOTE — Telephone Encounter (Signed)
Called, spoke with pt - informed her of below recs per Dr. Maple HudsonYoung.  She verbalized understanding and voiced no further questions or concerns at this time.  Pt is to call office back if symptoms do not improve or if they worsen and is to seek emergency care if needed.  Note:  Received override warning with zpak and nortriptyline.  Warning reviewed by Dr. Maple HudsonYoung who gave VO to proceed with zpak as directed.

## 2014-03-29 NOTE — Telephone Encounter (Signed)
Spoke with pt--requesting ZPAK. C/o sinus infection-- sinus pressure, HA, head congestion, runny nose, PND, cough with little sore throat.  Denies F/C/S.  Allergies  Allergen Reactions  . Influenza Vac Split Quad Other (See Comments)    Size of grapefruit red, swollen area at injection site  Leg cramps  Vaccine given was Fluarix Quadrivalent 2014/2015 formula by [employer]  . Penicillins   . Sulfonamide Derivatives   [facility] [address]  Please advise Dr Maple HudsonYoung. Thanks.

## 2014-04-17 ENCOUNTER — Telehealth: Payer: Self-pay | Admitting: Internal Medicine

## 2014-04-17 MED ORDER — LEVOFLOXACIN 500 MG PO TABS: 500.0000 mg | ORAL_TABLET | Freq: Every day | ORAL | Status: DC

## 2014-04-17 NOTE — Telephone Encounter (Signed)
Spoke with patient--c/o cough, sinus/nasal congestion with yellow mucus. Denies fever. Pt states that she completed ZPAK 04/15/14 and is still using Mucinex--no improvement.  Would like something stronger called into pharmacy . [facility] [address]  Allergies  Allergen Reactions  . Influenza Vac Split Quad Other (See Comments)    Size of grapefruit red, swollen area at injection site  Leg cramps  Vaccine given was Fluarix Quadrivalent 2014/2015 formula by [employer]  . Penicillins   . Sulfonamide Derivatives    Please advise Dr Maple HudsonYoung. Thanks.

## 2014-04-17 NOTE — Telephone Encounter (Signed)
Called spoke with pt. Aware of recs. RX sent in. Nothing further needed 

## 2014-04-17 NOTE — Telephone Encounter (Signed)
Offer levaquin 500, # 7, 1 daily

## 2014-04-28 ENCOUNTER — Other Ambulatory Visit: Payer: Self-pay | Admitting: Family Medicine

## 2014-04-28 NOTE — Telephone Encounter (Signed)
Med filled.  

## 2014-05-11 ENCOUNTER — Other Ambulatory Visit: Payer: Self-pay | Admitting: Family Medicine

## 2014-05-11 NOTE — Telephone Encounter (Signed)
Med filled.  

## 2014-05-31 ENCOUNTER — Telehealth: Payer: Self-pay | Admitting: Family Medicine

## 2014-05-31 MED ORDER — LEVOTHYROXINE SODIUM 88 MCG PO TABS: 88.0000 ug | ORAL_TABLET | Freq: Every day | ORAL | Status: DC

## 2014-05-31 NOTE — Telephone Encounter (Signed)
Patient already has a cpe scheduled for 06/22/14

## 2014-05-31 NOTE — Telephone Encounter (Signed)
Med filled, pt is overdue for a Physical ( was due in October)

## 2014-05-31 NOTE — Telephone Encounter (Signed)
Caller name: Esmirna Relation to pt: self  Call back number: [phone] Pharmacy:  [facility] [address]  Reason for call:   Patient requesting a refill of synthroid

## 2014-06-21 ENCOUNTER — Encounter: Payer: Self-pay | Admitting: *Deleted

## 2014-06-21 ENCOUNTER — Telehealth: Payer: Self-pay | Admitting: *Deleted

## 2014-06-21 NOTE — Telephone Encounter (Signed)
Pre-Visit Call:   Reviewed allergies, medications, health history, and health maintenance with patient and made changes as appropriate.   Preferred pharmacy:  [id number] [place] [id number]  CCS- 02/18/08- patient reported, negative per patient Mmg- [facility] Clifton Jamesick Cornello- BI RADS CATEGORY 1: Neg BD- [facility] Dr. Antonietta JewelAshley Hawkins- osteopenia  Flu- 01/11/14 (also listed as allergy) Td- 07/10/08  Pnm- 02/17/13 Zoster- has not had "because she had chicken pox"  Concerns: No Questions or concerns, here for routine check

## 2014-06-22 ENCOUNTER — Ambulatory Visit (INDEPENDENT_AMBULATORY_CARE_PROVIDER_SITE_OTHER): Payer: 59 | Admitting: Family Medicine

## 2014-06-22 ENCOUNTER — Encounter: Payer: Self-pay | Admitting: Family Medicine

## 2014-06-22 VITALS — BP 118/80 | HR 92 | Temp 98.2°F | Resp 16 | Ht 59.0 in | Wt 145.5 lb

## 2014-06-22 DIAGNOSIS — Z0000 Encounter for general adult medical examination without abnormal findings: Secondary | ICD-10-CM

## 2014-06-22 MED ORDER — METOPROLOL SUCCINATE ER 100 MG PO TB24: 100.0000 mg | ORAL_TABLET | Freq: Every day | ORAL | Status: DC

## 2014-06-22 MED ORDER — LOSARTAN POTASSIUM 100 MG PO TABS: 100.0000 mg | ORAL_TABLET | Freq: Every day | ORAL | Status: DC

## 2014-06-22 NOTE — Progress Notes (Signed)
Pre visit review using our clinic review tool, if applicable. No additional management support is needed unless otherwise documented below in the visit note. 

## 2014-06-22 NOTE — Progress Notes (Signed)
   Subjective:    Patient ID: Alicia Wallace, female    DOB: December 11, 1945, 69 y.o.   MRN: [id number]  HPI CPE- UTD on colonoscopy, mammo, DEXA.  No need for paps.  No concerns today.    Review of Systems Patient reports no vision/ hearing changes, adenopathy,fever, weight change,  persistant/recurrent hoarseness , swallowing issues, chest pain, palpitations, edema, persistant/recurrent cough, hemoptysis, dyspnea (rest/exertional/paroxysmal nocturnal), gastrointestinal bleeding (melena, rectal bleeding), abdominal pain, significant heartburn, bowel changes, GU symptoms (dysuria, hematuria, incontinence), Gyn symptoms (abnormal  bleeding, pain),  syncope, focal weakness, memory loss, numbness & tingling, skin/hair/nail changes, abnormal bruising or bleeding, anxiety, or depression.     Objective:   Physical Exam General Appearance:    Alert, cooperative, no distress, appears stated age  Head:    Normocephalic, without obvious abnormality, atraumatic  Eyes:    PERRL, conjunctiva/corneas clear, EOM's intact, fundi    benign, both eyes  Ears:    Normal TM's and external ear canals, both ears  Nose:   Nares normal, septum midline, mucosa normal, no drainage    or sinus tenderness  Throat:   Lips, mucosa, and tongue normal; teeth and gums normal  Neck:   Supple, symmetrical, trachea midline, no adenopathy;    Thyroid: no enlargement/tenderness/nodules  Back:     Symmetric, no curvature, ROM normal, no CVA tenderness  Lungs:     Clear to auscultation bilaterally, respirations unlabored  Chest Wall:    No tenderness or deformity   Heart:    Regular rate and rhythm, S1 and S2 normal, no murmur, rub   or gallop  Breast Exam:    Deferred to mammo  Abdomen:     Soft, non-tender, bowel sounds active all four quadrants,    no masses, no organomegaly  Genitalia:    Deferred  Rectal:    Extremities:   Extremities normal, atraumatic, no cyanosis or edema  Pulses:   2+ and symmetric all extremities    Skin:   Skin color, texture, turgor normal, no rashes or lesions  Lymph nodes:   Cervical, supraclavicular, and axillary nodes normal  Neurologic:   CNII-XII intact, normal strength, sensation and reflexes    throughout          Assessment & Plan:

## 2014-06-22 NOTE — Patient Instructions (Signed)
Follow up in 6 months to recheck BP We'll notify you of your lab results and make any changes if needed Keep up the good work on healthy diet and regular exercise Call with any questions or concerns Happy Valentine's Day!!

## 2014-06-22 NOTE — Assessment & Plan Note (Signed)
Pt's PE WNL  UTD on mammo, DEXA, colonoscopy.  No need for paps due to hysterectomy.  EKG obtained as baseline.  Check labs.  Anticipatory guidance provided.

## 2014-06-23 LAB — BASIC METABOLIC PANEL
BUN: 14 mg/dL (ref 6–23)
CHLORIDE: 101 meq/L (ref 96–112)
CO2: 31 mEq/L (ref 19–32)
Calcium: 9.5 mg/dL (ref 8.4–10.5)
Creatinine, Ser: 0.76 mg/dL (ref 0.40–1.20)
GFR: 80.71 mL/min (ref >60.00)
Glucose, Bld: 73 mg/dL (ref 70–99)
POTASSIUM: 4 meq/L (ref 3.5–5.1)
Sodium: 136 mEq/L (ref 135–145)

## 2014-06-23 LAB — CBC WITH DIFFERENTIAL/PLATELET
BASOS PCT: 0.2 % (ref 0.0–3.0)
Basophils Absolute: 0 10*3/uL (ref 0.0–0.1)
EOS ABS: 0.2 10*3/uL (ref 0.0–0.7)
Eosinophils Relative: 2 % (ref 0.0–5.0)
HEMATOCRIT: 37.5 % (ref 36.0–46.0)
HEMOGLOBIN: 12.7 g/dL (ref 12.0–15.0)
LYMPHS ABS: 3.2 10*3/uL (ref 0.7–4.0)
LYMPHS PCT: 34.4 % (ref 12.0–46.0)
MCHC: 33.7 g/dL (ref 30.0–36.0)
MCV: 91.1 fl (ref 78.0–100.0)
Monocytes Absolute: 0.6 10*3/uL (ref 0.1–1.0)
Monocytes Relative: 6 % (ref 3.0–12.0)
NEUTROS ABS: 5.3 10*3/uL (ref 1.4–7.7)
Neutrophils Relative %: 57.4 % (ref 43.0–77.0)
Platelets: 332 10*3/uL (ref 150.0–400.0)
RBC: 4.12 Mil/uL (ref 3.87–5.11)
RDW: 14 % (ref 11.5–15.5)
WBC: 9.3 10*3/uL (ref 4.0–10.5)

## 2014-06-23 LAB — HEPATIC FUNCTION PANEL
ALBUMIN: 4 g/dL (ref 3.5–5.2)
ALT: 19 U/L (ref 0–35)
AST: 20 U/L (ref 0–37)
Alkaline Phosphatase: 78 U/L (ref 39–117)
BILIRUBIN TOTAL: 0.3 mg/dL (ref 0.2–1.2)
Bilirubin, Direct: 0.1 mg/dL (ref 0.0–0.3)
Total Protein: 7.3 g/dL (ref 6.0–8.3)

## 2014-06-23 LAB — LIPID PANEL
CHOLESTEROL: 220 mg/dL — AB (ref 0–200)
HDL: 51.1 mg/dL (ref >39.00)
NONHDL: 168.9
Total CHOL/HDL Ratio: 4
Triglycerides: 219 mg/dL — ABNORMAL HIGH (ref 0.0–149.0)
VLDL: 43.8 mg/dL — ABNORMAL HIGH (ref 0.0–40.0)

## 2014-06-23 LAB — TSH: TSH: 2.62 u[IU]/mL (ref 0.35–4.50)

## 2014-06-23 LAB — VITAMIN D 25 HYDROXY (VIT D DEFICIENCY, FRACTURES): VITD: 33.02 ng/mL (ref 30.00–100.00)

## 2014-06-23 LAB — LDL CHOLESTEROL, DIRECT: LDL DIRECT: 142 mg/dL

## 2014-06-26 ENCOUNTER — Encounter: Payer: Self-pay | Admitting: General Practice

## 2014-06-29 ENCOUNTER — Other Ambulatory Visit: Payer: Self-pay | Admitting: Family Medicine

## 2014-06-29 NOTE — Telephone Encounter (Signed)
Med filled.  

## 2014-07-20 ENCOUNTER — Telehealth: Payer: Self-pay | Admitting: Internal Medicine

## 2014-07-20 MED ORDER — AZITHROMYCIN 250 MG PO TABS: ORAL_TABLET | ORAL | Status: AC

## 2014-07-20 NOTE — Telephone Encounter (Signed)
Spoke with the pt  She is c/o "sinus infection"- sinus congestion/pressure and prod cough with moderate yellow/green sputum x 2 days  She would like to have abx called in  Please advise, thanks! Last ov 01/17/14 Next ov 01/18/15  Allergies  Allergen Reactions  . Influenza Vac Split Quad Other (See Comments)    Size of grapefruit red, swollen area at injection site  Leg cramps  Vaccine given was Fluarix Quadrivalent 2014/2015 formula by [employer]  . Penicillins   . Sulfonamide Derivatives    Current Outpatient Prescriptions on File Prior to Visit  Medication Sig Dispense Refill  . Calcium Citrate-Vitamin D (CITRUS CALCIUM 1500 + D PO) Take by mouth.    . Cholecalciferol (VITAMIN D3) 2000 UNITS TABS Take by mouth.    . estradiol (VIVELLE-DOT) 0.025 MG/24HR Place 1 patch onto the skin 2 (two) times a week.    . levothyroxine (SYNTHROID, LEVOTHROID) 88 MCG tablet TAKE ONE TABLET BY MOUTH DAILY 30 tablet 3  . losartan (COZAAR) 100 MG tablet Take 1 tablet (100 mg total) by mouth daily. 30 tablet 6  . metoprolol succinate (TOPROL-XL) 100 MG 24 hr tablet Take 1 tablet (100 mg total) by mouth daily. 30 tablet 6  . nortriptyline (PAMELOR) 10 MG capsule Take 2 capsules by mouth daily.    Marland Kitchen. zolpidem (AMBIEN) 10 MG tablet Take 1 tablet by mouth at bedtime as needed.     No current facility-administered medications on file prior to visit.

## 2014-07-20 NOTE — Telephone Encounter (Signed)
Try Zpak

## 2014-07-20 NOTE — Telephone Encounter (Signed)
Rx has been sent in per CY. Pt is aware. Nothing further was needed. 

## 2014-10-26 LAB — HM MAMMOGRAPHY

## 2014-10-27 ENCOUNTER — Other Ambulatory Visit: Payer: Self-pay | Admitting: Family Medicine

## 2014-10-30 ENCOUNTER — Encounter: Payer: Self-pay | Admitting: General Practice

## 2014-10-30 NOTE — Telephone Encounter (Signed)
Med filled.  

## 2014-10-31 ENCOUNTER — Telehealth: Payer: Self-pay | Admitting: Internal Medicine

## 2014-10-31 NOTE — Telephone Encounter (Signed)
In your 01/17/14 ov notes you said: Needs our assistance with ins. In getting back on vac.. She can't afford her share of the cost of allergy vac. After ins. Florentina Addison looked into and couldn't get assistance for all. Vac.Last vac.12/24/12. Not documented in chart.

## 2014-11-01 NOTE — Telephone Encounter (Signed)
Consider her allergy vaccine dc'd

## 2014-11-01 NOTE — Telephone Encounter (Signed)
Noted & done

## 2014-11-03 ENCOUNTER — Encounter: Payer: Self-pay | Admitting: Internal Medicine

## 2014-12-22 ENCOUNTER — Encounter: Payer: Self-pay | Admitting: General Practice

## 2014-12-22 ENCOUNTER — Ambulatory Visit (INDEPENDENT_AMBULATORY_CARE_PROVIDER_SITE_OTHER): Payer: 59 | Admitting: Family Medicine

## 2014-12-22 ENCOUNTER — Encounter: Payer: Self-pay | Admitting: Family Medicine

## 2014-12-22 VITALS — BP 122/80 | HR 91 | Temp 98.0°F | Resp 16 | Ht 59.0 in | Wt 147.2 lb

## 2014-12-22 DIAGNOSIS — Z23 Encounter for immunization: Secondary | ICD-10-CM | POA: Diagnosis not present

## 2014-12-22 DIAGNOSIS — I10 Essential (primary) hypertension: Secondary | ICD-10-CM | POA: Diagnosis not present

## 2014-12-22 LAB — BASIC METABOLIC PANEL
BUN: 13 mg/dL (ref 6–23)
CHLORIDE: 102 meq/L (ref 96–112)
CO2: 29 mEq/L (ref 19–32)
CREATININE: 0.65 mg/dL (ref 0.40–1.20)
Calcium: 9.5 mg/dL (ref 8.4–10.5)
GFR: 96.52 mL/min (ref >60.00)
Glucose, Bld: 90 mg/dL (ref 70–99)
POTASSIUM: 4 meq/L (ref 3.5–5.1)
Sodium: 138 mEq/L (ref 135–145)

## 2014-12-22 MED ORDER — LOSARTAN POTASSIUM 100 MG PO TABS: 100.0000 mg | ORAL_TABLET | Freq: Every day | ORAL | Status: DC

## 2014-12-22 MED ORDER — METOPROLOL SUCCINATE ER 100 MG PO TB24: 100.0000 mg | ORAL_TABLET | Freq: Every day | ORAL | Status: DC

## 2014-12-22 NOTE — Progress Notes (Signed)
   Subjective:    Patient ID: Alicia Wallace, female    DOB: 1945-08-16, 69 y.o.   MRN: [id number]  HPI HTN- chronic problem, on Losartan and Metoprolol daily w/ good control.  Denies CP, SOB, HAs, visual changes, edema.  Walking regularly.  Denies current concerns.   Review of Systems For ROS see HPI     Objective:   Physical Exam  Constitutional: She is oriented to person, place, and time. She appears well-developed and well-nourished. No distress.  HENT:  Head: Normocephalic and atraumatic.  Eyes: Conjunctivae and EOM are normal. Pupils are equal, round, and reactive to light.  Neck: Normal range of motion. Neck supple. No thyromegaly present.  Cardiovascular: Normal rate, regular rhythm, normal heart sounds and intact distal pulses.   No murmur heard. Pulmonary/Chest: Effort normal and breath sounds normal. No respiratory distress.  Abdominal: Soft. She exhibits no distension. There is no tenderness.  Musculoskeletal: She exhibits no edema.  Lymphadenopathy:    She has no cervical adenopathy.  Neurological: She is alert and oriented to person, place, and time.  Skin: Skin is warm and dry.  Psychiatric: She has a normal mood and affect. Her behavior is normal.  Vitals reviewed.         Assessment & Plan:

## 2014-12-22 NOTE — Addendum Note (Signed)
Addended by: Jackson Latino on: 12/22/2014 08:57 AM   Modules accepted: Orders

## 2014-12-22 NOTE — Assessment & Plan Note (Signed)
Chronic problem, well controlled.  Asymptomatic.  Check labs due to ARB use.  No anticipated med changes.

## 2014-12-22 NOTE — Patient Instructions (Signed)
Schedule your complete physical in 6 months We'll notify you of your lab results and make any changes if needed Keep up the good work on healthy diet and regular exercise Call with any questions or concerns ENJOY YOUR VACATION!!! 

## 2014-12-22 NOTE — Progress Notes (Signed)
Pre visit review using our clinic review tool, if applicable. No additional management support is needed unless otherwise documented below in the visit note. 

## 2015-01-18 ENCOUNTER — Encounter: Payer: Self-pay | Admitting: Internal Medicine

## 2015-01-18 ENCOUNTER — Ambulatory Visit (INDEPENDENT_AMBULATORY_CARE_PROVIDER_SITE_OTHER): Payer: 59 | Admitting: Internal Medicine

## 2015-01-18 VITALS — BP 110/70 | HR 92 | Ht 59.0 in | Wt 150.5 lb

## 2015-01-18 DIAGNOSIS — J301 Allergic rhinitis due to pollen: Secondary | ICD-10-CM

## 2015-01-18 DIAGNOSIS — J45998 Other asthma: Secondary | ICD-10-CM

## 2015-01-18 NOTE — Patient Instructions (Signed)
Glad you are doing so well  Ok to use an otc antihistamine like claritin, and/ or a nasal spray like flonase if needed for allergies Please call if we can help

## 2015-01-18 NOTE — Progress Notes (Signed)
05/19/11- 65 yoF never smoker followed for allergic rhinitis, asthma LOV-04/09/10 We had called in Zpak in November and it helped. Otherwise has done "great". Continues allergy vaccine and thinks it helps her. Heartburn is controlled. Singulair helps.  05/17/12 65 yoF never smoker followed for allergic rhinitis, asthma FOLLOWS FOR: still on weekly allergy vaccine 1:10 GH, doing well She complains of recent sinus and ear infection. Took Z-Pak and then doxycycline, finished 4 days ago. Still feels congested in her chest with yellow sputum. Ears feel "sensitive" without popping or decreased hearing. Using Mucinex.  01/17/13- 67 yoF never smoker followed for allergic rhinitis, asthma FOLLOWS FOR: still on Allergy vaccine 1:10 GH and doing well; would like Flu shot today. Feels allergy vaccine has done well. Has no concerns. No recent asthma CXR 05/17/12 IMPRESSION:  Suboptimal inspiration. Mild elevation of the right hemidiaphragm.  Mild cardiomegaly.  Original Report Authenticated By: Dwyane Dee, M.D.  01/17/14-68 yoF never smoker followed for allergic rhinitis, asthma FOLLOWS FOR: Has been off vaccine since 03-2013 because of insurance change-can tell that she has been off of them; however  needs our assistance with insurance in getting her back on vaccine. She can't afford her share of the cost of allergy vaccine after insurance. She very much wants to restart vaccine if something can be worked out. Has perennial sneeze, itch and drainage. Had sinusitis twice during the springtime which is worse than her usual. She has been using Allegra and Mucinex. Also using Afrin which we discussed. She has another letter from her insurance that she is going to bring for [place] to see. Has had flu shot.  01/18/15-68 yoF never smoker followed for allergic rhinitis, asthma FOLLOWS FOR:  pt stated that she has not been having any problems to speak of   Had flu vaccine. Using daily Mucinex with no other meds and feels well.  Has no rescue inhaler. Denies sleep disturbance or wheeze. Had stopped allergy vaccine last year because of insurance  ROS-see HPi Constitutional:   No-   weight loss, night sweats, fevers, chills, fatigue, lassitude. HEENT:   No-  headaches, difficulty swallowing, tooth/dental problems, sore throat,       No-  Sneezing, itching, ear ache, nasal congestion, post nasal drip,  CV:  No-   chest pain, orthopnea, PND, swelling in lower extremities, anasarca, dizziness, palpitations Resp: No-   shortness of breath with exertion or at rest.              No-   productive cough,  No non-productive cough,  No- coughing up of blood.              No-   change in color of mucus.  No- wheezing.   Skin: No-   rash or lesions. GI:  No-   heartburn, indigestion, abdominal pain, nausea, vomiting,  GU:  MS:  No-   joint pain or swelling.  . Neuro-     nothing unusual Psych:  No- change in mood or affect. No depression or anxiety.  No memory loss.  General- Alert, Oriented, Affect-appropriate, Distress- none acute Skin- rash-none, lesions- none, excoriation- none Lymphadenopathy- none Head- atraumatic            Eyes- Gross vision intact, PERRLA, conjunctivae clear secretions            Ears- Hearing, canals-normal            Nose- + mild turbinate edema, no-Septal dev,polyps, erosion, perforation  Throat- Mallampati IV , mucosa clear , drainage- none, tonsils- atrophic Neck- flexible , trachea midline, no stridor , thyroid nl, carotid no bruit Chest - symmetrical excursion , unlabored           Heart/CV- RRR , no murmur , no gallop  , no rub, nl s1 s2                           - JVD- none , edema- none, stasis changes- none, varices- none           Lung- clear to P&A, wheeze- none, cough-none , dullness-none, rub- none           Chest wall-  Abd-  Br/ Gen/ Rectal- Not done, not indicated Extrem- cyanosis- none, clubbing, none, atrophy- none, strength- nl Neuro- grossly intact to  observation

## 2015-02-25 NOTE — Assessment & Plan Note (Signed)
Adequate control. We discussed antihistamines and nasal steroids for her to resume if needed.

## 2015-02-25 NOTE — Assessment & Plan Note (Signed)
Mild intermittent asthma, well controlled. Not needing rescue inhaler or intervention now.

## 2015-04-12 LAB — HM PAP SMEAR

## 2015-04-28 ENCOUNTER — Other Ambulatory Visit: Payer: Self-pay | Admitting: Family Medicine

## 2015-04-30 NOTE — Telephone Encounter (Signed)
Medication filled to pharmacy as requested.   

## 2015-06-26 ENCOUNTER — Encounter: Payer: Self-pay | Admitting: Behavioral Health

## 2015-06-26 ENCOUNTER — Telehealth: Payer: Self-pay | Admitting: Behavioral Health

## 2015-06-26 NOTE — Telephone Encounter (Signed)
Pre-Visit Call completed with patient and chart updated.   Pre-Visit Info documented in Specialty Comments under [employer].    

## 2015-06-27 ENCOUNTER — Encounter: Payer: Self-pay | Admitting: General Practice

## 2015-06-27 ENCOUNTER — Ambulatory Visit (INDEPENDENT_AMBULATORY_CARE_PROVIDER_SITE_OTHER): Payer: 59 | Admitting: Family Medicine

## 2015-06-27 ENCOUNTER — Encounter: Payer: Self-pay | Admitting: Family Medicine

## 2015-06-27 VITALS — BP 130/92 | HR 95 | Temp 97.6°F | Ht 59.0 in | Wt 150.6 lb

## 2015-06-27 DIAGNOSIS — Z0000 Encounter for general adult medical examination without abnormal findings: Secondary | ICD-10-CM | POA: Diagnosis not present

## 2015-06-27 LAB — CBC WITH DIFFERENTIAL/PLATELET
BASOS ABS: 0 10*3/uL (ref 0.0–0.1)
Basophils Relative: 0.4 % (ref 0.0–3.0)
Eosinophils Absolute: 0.2 10*3/uL (ref 0.0–0.7)
Eosinophils Relative: 2.3 % (ref 0.0–5.0)
HEMATOCRIT: 36.4 % (ref 36.0–46.0)
Hemoglobin: 12.3 g/dL (ref 12.0–15.0)
LYMPHS PCT: 37.2 % (ref 12.0–46.0)
Lymphs Abs: 2.6 10*3/uL (ref 0.7–4.0)
MCHC: 33.9 g/dL (ref 30.0–36.0)
MCV: 89.7 fl (ref 78.0–100.0)
Monocytes Absolute: 0.5 10*3/uL (ref 0.1–1.0)
Monocytes Relative: 6.8 % (ref 3.0–12.0)
NEUTROS PCT: 53.3 % (ref 43.0–77.0)
Neutro Abs: 3.7 10*3/uL (ref 1.4–7.7)
Platelets: 312 10*3/uL (ref 150.0–400.0)
RBC: 4.05 Mil/uL (ref 3.87–5.11)
RDW: 13.8 % (ref 11.5–15.5)
WBC: 7 10*3/uL (ref 4.0–10.5)

## 2015-06-27 LAB — BASIC METABOLIC PANEL
BUN: 15 mg/dL (ref 6–23)
CALCIUM: 9.5 mg/dL (ref 8.4–10.5)
CO2: 29 mEq/L (ref 19–32)
Chloride: 103 mEq/L (ref 96–112)
Creatinine, Ser: 0.59 mg/dL (ref 0.40–1.20)
GFR: 107.77 mL/min (ref >60.00)
GLUCOSE: 96 mg/dL (ref 70–99)
Potassium: 4.1 mEq/L (ref 3.5–5.1)
SODIUM: 138 meq/L (ref 135–145)

## 2015-06-27 LAB — LIPID PANEL
Cholesterol: 184 mg/dL (ref 0–200)
HDL: 46.9 mg/dL (ref >39.00)
LDL CALC: 109 mg/dL — AB (ref 0–99)
NonHDL: 137.16
TRIGLYCERIDES: 142 mg/dL (ref 0.0–149.0)
Total CHOL/HDL Ratio: 4
VLDL: 28.4 mg/dL (ref 0.0–40.0)

## 2015-06-27 LAB — HEPATIC FUNCTION PANEL
ALBUMIN: 3.9 g/dL (ref 3.5–5.2)
ALT: 15 U/L (ref 0–35)
AST: 19 U/L (ref 0–37)
Alkaline Phosphatase: 74 U/L (ref 39–117)
Bilirubin, Direct: 0 mg/dL (ref 0.0–0.3)
Total Bilirubin: 0.3 mg/dL (ref 0.2–1.2)
Total Protein: 7 g/dL (ref 6.0–8.3)

## 2015-06-27 LAB — TSH: TSH: 1.96 u[IU]/mL (ref 0.35–4.50)

## 2015-06-27 LAB — VITAMIN D 25 HYDROXY (VIT D DEFICIENCY, FRACTURES): VITD: 27.55 ng/mL — AB (ref 30.00–100.00)

## 2015-06-27 NOTE — Progress Notes (Signed)
Pre visit review using our clinic review tool, if applicable. No additional management support is needed unless otherwise documented below in the visit note. 

## 2015-06-27 NOTE — Progress Notes (Signed)
   Subjective:    Patient ID: Alicia Wallace, female    DOB: 1946-02-28, 69 y.o.   MRN: [id number]  HPI CPE- UTD on GYN [facility]).  UTD on immunizations, colonoscopy.  No concerns today.   Review of Systems Patient reports no vision/ hearing changes, adenopathy,fever, weight change,  persistant/recurrent hoarseness , swallowing issues, chest pain, palpitations, edema, persistant/recurrent cough, hemoptysis, dyspnea (rest/exertional/paroxysmal nocturnal), gastrointestinal bleeding (melena, rectal bleeding), abdominal pain, significant heartburn, bowel changes, GU symptoms (dysuria, hematuria, incontinence), Gyn symptoms (abnormal  bleeding, pain),  syncope, focal weakness, memory loss, numbness & tingling, skin/hair/nail changes, abnormal bruising or bleeding, anxiety, or depression.     Objective:   Physical Exam General Appearance:    Alert, cooperative, no distress, appears stated age  Head:    Normocephalic, without obvious abnormality, atraumatic  Eyes:    PERRL, conjunctiva/corneas clear, EOM's intact, fundi    benign, both eyes  Ears:    Normal TM's and external ear canals, both ears  Nose:   Nares normal, septum midline, mucosa normal, no drainage    or sinus tenderness  Throat:   Lips, mucosa, and tongue normal; teeth and gums normal  Neck:   Supple, symmetrical, trachea midline, no adenopathy;    Thyroid: no enlargement/tenderness/nodules  Back:     Symmetric, no curvature, ROM normal, no CVA tenderness  Lungs:     Clear to auscultation bilaterally, respirations unlabored  Chest Wall:    No tenderness or deformity   Heart:    Regular rate and rhythm, S1 and S2 normal, no murmur, rub   or gallop  Breast Exam:    Deferred to GYN  Abdomen:     Soft, non-tender, bowel sounds active all four quadrants,    no masses, no organomegaly  Genitalia:    Deferred to GYN  Rectal:    Extremities:   Extremities normal, atraumatic, no cyanosis or edema  Pulses:   2+ and symmetric all  extremities  Skin:   Skin color, texture, turgor normal, no rashes or lesions  Lymph nodes:   Cervical, supraclavicular, and axillary nodes normal  Neurologic:   CNII-XII intact, normal strength, sensation and reflexes    throughout          Assessment & Plan:

## 2015-06-27 NOTE — Assessment & Plan Note (Signed)
Pt's PE WNL.  UTD on colonoscopy, GYN.  Check labs.  Anticipatory guidance provided.  

## 2015-06-27 NOTE — Patient Instructions (Signed)
Follow up in 6 months to recheck BP We'll notify you of your lab results and make any changes if needed Keep up the good work on healthy diet and regular exercise- you look great! You are up to date on colonoscopy until 2020- yay!! Call with any questions or concerns If you want to join [place] at the new [place] office, any scheduled appointments will automatically transfer and we will see you at [address] [place] [address] [place], [place] [address] [facility]) Happy Early [place] Ouch!!!

## 2015-06-28 ENCOUNTER — Telehealth: Payer: Self-pay

## 2015-06-28 ENCOUNTER — Other Ambulatory Visit: Payer: Self-pay | Admitting: Family Medicine

## 2015-06-28 NOTE — Telephone Encounter (Signed)
Patient called regarding lab results.Advised vitamin D was low and to start OTC 50,000U daily

## 2015-07-27 ENCOUNTER — Other Ambulatory Visit: Payer: Self-pay | Admitting: Family Medicine

## 2015-07-27 NOTE — Telephone Encounter (Signed)
Medication filled to pharmacy as requested.   

## 2015-08-29 ENCOUNTER — Other Ambulatory Visit: Payer: Self-pay | Admitting: General Practice

## 2015-08-29 DIAGNOSIS — I10 Essential (primary) hypertension: Secondary | ICD-10-CM

## 2015-08-29 MED ORDER — METOPROLOL SUCCINATE ER 100 MG PO TB24: 100.0000 mg | ORAL_TABLET | Freq: Every day | ORAL | Status: DC

## 2015-10-25 ENCOUNTER — Other Ambulatory Visit: Payer: Self-pay | Admitting: General Practice

## 2015-10-25 MED ORDER — LEVOTHYROXINE SODIUM 88 MCG PO TABS: 88.0000 ug | ORAL_TABLET | Freq: Every day | ORAL | Status: DC

## 2015-11-08 ENCOUNTER — Telehealth: Payer: Self-pay | Admitting: Internal Medicine

## 2015-11-08 MED ORDER — AZITHROMYCIN 250 MG PO TABS: ORAL_TABLET | ORAL | Status: DC

## 2015-11-08 NOTE — Telephone Encounter (Signed)
Called spoke with pt. Aware of recs. RX sent in. Nothing further needed 

## 2015-11-08 NOTE — Telephone Encounter (Signed)
Offer Zpak with one refill to take if needed

## 2015-11-08 NOTE — Telephone Encounter (Signed)
Spoke with pt and she c/o sinus congestion with yellow mucus, pain/pressure, cough with yellow mucus and body aches and pains. Pt has been taking Mucinex but still has congestion. She would like rx sent to pharmacy. Pt denies SOB/wheeze or f/n/v.   CY Please advise. Thanks!   Allergies  Allergen Reactions  . Influenza Vac Split Quad Other (See Comments)    Size of grapefruit red, swollen area at injection site  Leg cramps  Vaccine given was Fluarix Quadrivalent 2014/2015 formula by [employer]  . Penicillins Hives  . Sulfonamide Derivatives Hives    Current Outpatient Prescriptions on File Prior to Visit  Medication Sig Dispense Refill  . Calcium Citrate-Vitamin D (CITRUS CALCIUM 1500 + D PO) Take by mouth.    . Cholecalciferol (VITAMIN D3) 2000 UNITS TABS Take by mouth.    . estradiol (VIVELLE-DOT) 0.025 MG/24HR Place 1 patch onto the skin 2 (two) times a week.    . levothyroxine (SYNTHROID, LEVOTHROID) 88 MCG tablet Take 1 tablet (88 mcg total) by mouth daily. 30 tablet 5  . losartan (COZAAR) 100 MG tablet TAKE 1 TABLET (100 MG TOTAL) BY MOUTH DAILY. 90 tablet 1  . metoprolol succinate (TOPROL-XL) 100 MG 24 hr tablet Take 1 tablet (100 mg total) by mouth daily. 90 tablet 1  . nortriptyline (PAMELOR) 10 MG capsule Take 2 capsules by mouth daily.    Marland Kitchen. zolpidem (AMBIEN) 10 MG tablet Take 1 tablet by mouth at bedtime as needed.     No current facility-administered medications on file prior to visit.

## 2015-11-09 ENCOUNTER — Encounter (HOSPITAL_COMMUNITY): Payer: Self-pay | Admitting: Emergency Medicine

## 2015-11-09 ENCOUNTER — Emergency Department (HOSPITAL_COMMUNITY): Payer: 59

## 2015-11-09 ENCOUNTER — Emergency Department (HOSPITAL_COMMUNITY)
Admission: EM | Admit: 2015-11-09 | Discharge: 2015-11-09 | Disposition: A | Discharge status: Home / Self Care | Payer: 59 | Attending: Emergency Medicine | Admitting: Emergency Medicine

## 2015-11-09 DIAGNOSIS — E039 Hypothyroidism, unspecified: Secondary | ICD-10-CM | POA: Insufficient documentation

## 2015-11-09 DIAGNOSIS — R55 Syncope and collapse: Secondary | ICD-10-CM | POA: Diagnosis not present

## 2015-11-09 DIAGNOSIS — J45909 Unspecified asthma, uncomplicated: Secondary | ICD-10-CM | POA: Insufficient documentation

## 2015-11-09 DIAGNOSIS — R1033 Periumbilical pain: Secondary | ICD-10-CM | POA: Insufficient documentation

## 2015-11-09 DIAGNOSIS — Z79899 Other long term (current) drug therapy: Secondary | ICD-10-CM | POA: Insufficient documentation

## 2015-11-09 DIAGNOSIS — I10 Essential (primary) hypertension: Secondary | ICD-10-CM | POA: Diagnosis not present

## 2015-11-09 LAB — COMPREHENSIVE METABOLIC PANEL
ALK PHOS: 80 U/L (ref 38–126)
ALT: 22 U/L (ref 14–54)
ANION GAP: 5 (ref 5–15)
AST: 22 U/L (ref 15–41)
Albumin: 3.6 g/dL (ref 3.5–5.0)
BILIRUBIN TOTAL: 0.6 mg/dL (ref 0.3–1.2)
BUN: 14 mg/dL (ref 6–20)
CALCIUM: 9.2 mg/dL (ref 8.9–10.3)
CO2: 27 mmol/L (ref 22–32)
Chloride: 104 mmol/L (ref 101–111)
Creatinine, Ser: 0.69 mg/dL (ref 0.44–1.00)
GFR calc Af Amer: >60 mL/min (ref >60)
Glucose, Bld: 108 mg/dL — ABNORMAL HIGH (ref 65–99)
POTASSIUM: 4.3 mmol/L (ref 3.5–5.1)
Sodium: 136 mmol/L (ref 135–145)
TOTAL PROTEIN: 6.9 g/dL (ref 6.5–8.1)

## 2015-11-09 LAB — CBC WITH DIFFERENTIAL/PLATELET
Basophils Absolute: 0 10*3/uL (ref 0.0–0.1)
Basophils Relative: 0 %
Eosinophils Absolute: 0 10*3/uL (ref 0.0–0.7)
Eosinophils Relative: 0 %
HEMATOCRIT: 32.1 % — AB (ref 36.0–46.0)
HEMOGLOBIN: 11.1 g/dL — AB (ref 12.0–15.0)
LYMPHS PCT: 15 %
Lymphs Abs: 1.9 10*3/uL (ref 0.7–4.0)
MCH: 30.5 pg (ref 26.0–34.0)
MCHC: 34.6 g/dL (ref 30.0–36.0)
MCV: 88.2 fL (ref 78.0–100.0)
MONO ABS: 1 10*3/uL (ref 0.1–1.0)
MONOS PCT: 8 %
NEUTROS ABS: 9.6 10*3/uL — AB (ref 1.7–7.7)
Neutrophils Relative %: 77 %
Platelets: 292 10*3/uL (ref 150–400)
RBC: 3.64 MIL/uL — ABNORMAL LOW (ref 3.87–5.11)
RDW: 13.6 % (ref 11.5–15.5)
WBC: 12.5 10*3/uL — ABNORMAL HIGH (ref 4.0–10.5)

## 2015-11-09 LAB — I-STAT TROPONIN, ED: TROPONIN I, POC: 0 ng/mL (ref 0.00–0.08)

## 2015-11-09 LAB — LIPASE, BLOOD: LIPASE: 17 U/L (ref 11–51)

## 2015-11-09 MED ORDER — IOPAMIDOL (ISOVUE-300) INJECTION 61%: 100.0000 mL | Freq: Once | INTRAVENOUS | Status: DC | PRN

## 2015-11-09 MED ORDER — SODIUM CHLORIDE 0.9 % IV BOLUS (SEPSIS)
1000.0000 mL | Freq: Once | INTRAVENOUS | Status: AC
  Administered 2015-11-09: 1000 mL via INTRAVENOUS

## 2015-11-09 MED ORDER — IOPAMIDOL (ISOVUE-300) INJECTION 61%
100.0000 mL | Freq: Once | INTRAVENOUS | Status: AC | PRN
  Administered 2015-11-09: 100 mL via INTRAVENOUS

## 2015-11-09 MED ORDER — CIPROFLOXACIN HCL 500 MG PO TABS: 500.0000 mg | ORAL_TABLET | Freq: Two times a day (BID) | ORAL | Status: DC

## 2015-11-09 MED ORDER — DIATRIZOATE MEGLUMINE & SODIUM 66-10 % PO SOLN
15.0000 mL | Freq: Once | ORAL | Status: AC
  Administered 2015-11-09: 15 mL via ORAL

## 2015-11-09 NOTE — ED Notes (Addendum)
Per EMS pt became diaphoretic and light headed while having a BM this morning. Pt walked back to her desk at work and lied down. Pt had positive orthostatics with EMS. 400 ml NS given en route. Pt did add episode of diarrhea this morning.

## 2015-11-09 NOTE — ED Provider Notes (Signed)
CSN: [id number]     Arrival date & time 11/09/15  1049 History   First MD Initiated Contact with Patient 11/09/15 1124     Chief Complaint  Patient presents with  . near sycope      (Consider location/radiation/quality/duration/timing/severity/associated sxs/prior Treatment) HPI 70 year old female who presents after near syncopal episode. She has a history of hypertension, hypothyroidism, and asthma. States that she has had increasing allergies this week, but has been in her usual state of health. This morning while at work, states that she developed abdominal pain, sharp in nature in the periumbilical and upper abdomen. States while sitting on the toilet she became very diaphoretic, nauseous, and lightheaded. States that she had small episode of diarrhea, pain resolved. She went back to her desk at and laid on the ground. Did not have syncope, chest pain, shortness of breath, palpitations, melena or hematochezia, back pain. Now feels tired but symptoms resolved. EMS was called by a coworker, nontender arrival she had orthostatic hypotension. She received 400 mL of IV fluids, with improvement in her blood pressure. Past Medical History  Diagnosis Date  . Asthma   . Allergic rhinitis   . Hypothyroid   . Hypertension    Past Surgical History  Procedure Laterality Date  . Appendectomy    . Cholecystectomy    . Total abdominal hysterectomy w/ bilateral salpingoophorectomy    . Tonsillectomy     Family History  Problem Relation Age of Onset  . Kidney disease Mother   . Heart disease Father   . Cancer Father     bladder  . Asthma Father   . Allergies Father    Social History  Substance Use Topics  . Smoking status: Never Smoker   . Smokeless tobacco: None  . Alcohol Use: No   OB History    No data available     Review of Systems 10/14 systems reviewed and are negative other than those stated in the HPI    Allergies  Influenza vac split quad; Penicillins; and Sulfonamide  derivatives  Home Medications   Prior to Admission medications   Medication Sig Start Date End Date Taking? Authorizing Provider  azithromycin (ZITHROMAX) 250 MG tablet Take as directed Patient taking differently: Take 250-500 mg by mouth daily. Take as directed 500 mg now, 250 mg by mouth daily x 5 days 11/08/15  Yes Waymon Budgelinton D Young, MD  Calcium Citrate-Vitamin D (CITRUS CALCIUM 1500 + D PO) Take 1 tablet by mouth daily.    Yes Historical Provider, MD  Cholecalciferol (VITAMIN D3) 2000 UNITS TABS Take 2,000 Units by mouth daily.    Yes Historical Provider, MD  estradiol (VIVELLE-DOT) 0.025 MG/24HR Place 1 patch onto the skin 2 (two) times a week.   Yes Historical Provider, MD  levothyroxine (SYNTHROID, LEVOTHROID) 88 MCG tablet Take 1 tablet (88 mcg total) by mouth daily. 10/25/15  Yes Sheliah HatchKatherine E Tabori, MD  losartan (COZAAR) 100 MG tablet TAKE 1 TABLET (100 MG TOTAL) BY MOUTH DAILY. 07/27/15  Yes Sheliah HatchKatherine E Tabori, MD  metoprolol succinate (TOPROL-XL) 100 MG 24 hr tablet Take 1 tablet (100 mg total) by mouth daily. 08/29/15  Yes Sheliah HatchKatherine E Tabori, MD  nortriptyline (PAMELOR) 10 MG capsule Take 10 mg by mouth at bedtime as needed for sleep.  08/19/13  Yes Historical Provider, MD  ciprofloxacin (CIPRO) 500 MG tablet Take 1 tablet (500 mg total) by mouth every 12 (twelve) hours. 11/09/15   Lavera Guiseana Duo Hamna Asa, MD   BP 121/70 mmHg  Pulse 98  Temp(Src) 98.2 F (36.8 C) (Oral)  Resp 18  SpO2 100% Physical Exam Physical Exam  Nursing note and vitals reviewed. Constitutional: Well developed, well nourished, non-toxic, and in no acute distress Head: Normocephalic and atraumatic.  Mouth/Throat: Oropharynx is clear and dry.  Neck: Normal range of motion. Neck supple.  Cardiovascular: Normal rate and regular rhythm.   Pulmonary/Chest: Effort normal and breath sounds normal.  Abdominal: Soft. There is no tenderness. There is no rebound and no guarding.  Musculoskeletal: Normal range of motion.   Neurological: Alert, no facial droop, fluent speech, moves all extremities symmetrically Skin: Skin is warm and dry.  Psychiatric: Cooperative  ED Course  Procedures (including critical care time) Labs Review Labs Reviewed  CBC WITH DIFFERENTIAL/PLATELET - Abnormal; Notable for the following:    WBC 12.5 (*)    RBC 3.64 (*)    Hemoglobin 11.1 (*)    HCT 32.1 (*)    Neutro Abs 9.6 (*)    All other components within normal limits  COMPREHENSIVE METABOLIC PANEL - Abnormal; Notable for the following:    Glucose, Bld 108 (*)    All other components within normal limits  LIPASE, BLOOD  I-STAT TROPOININ, ED    Imaging Review Ct Abdomen Pelvis W Contrast  11/09/2015  CLINICAL DATA:  Sharp abdominal pain periumbilical and in upper abdomen, near syncope, brief hypotension, asthma, prior cholecystectomy, hysterectomy, appendectomy EXAM: CT ABDOMEN AND PELVIS WITH CONTRAST TECHNIQUE: Multidetector CT imaging of the abdomen and pelvis was performed using the standard protocol following bolus administration of intravenous contrast. Sagittal and coronal MPR images reconstructed from axial data set. CONTRAST:  100mL ISOVUE-300 IOPAMIDOL (ISOVUE-300) INJECTION 61% IV. Dilute oral contrast. COMPARISON:  None FINDINGS: Lower chest:  Lung bases clear. Hepatobiliary: Gallbladder surgically absent. Liver normal appearance. No biliary dilatation. Pancreas: Normal appearance Spleen: Normal appearance Adrenals/Urinary Tract: Small cyst inferior pole LEFT kidney 10 x 9 mm diameter. Small scar inferior pole of RIGHT kidney. Otherwise normal appearing adrenal glands, kidneys, ureters, and bladder. Stomach/Bowel: Appendix surgically absent. Questionable wall thickening of descending colon versus artifact from underdistention, cannot exclude colitis. Few diverticula noted at distal descending and sigmoid colon without segmental wall thickening or pericolic inflammatory changes to suggest acute diverticulitis.  Vascular/Lymphatic: Vascular structures grossly patent. No adenopathy. Reproductive: Uterus surgically absent with nonvisualization of ovaries Other: No free air or free fluid.  No hernia. Musculoskeletal: Osseous structures unremarkable. IMPRESSION: Questionable wall thickening of the descending colon ; while this could be related to incomplete distention, possibility of descending colitis is raised Distal colonic diverticulosis without evidence of diverticulitis. Post cholecystectomy, hysterectomy, and appendectomy with nonvisualization of ovaries. Electronically Signed   By: Ulyses SouthwardMark  Boles M.D.   On: 11/09/2015 13:33   I have personally reviewed and evaluated these images and lab results as part of my medical decision-making.   EKG Interpretation   Date/Time:  Friday November 09 2015 11:04:16 EDT Ventricular Rate:  98 PR Interval:    QRS Duration: 70 QT Interval:  344 QTC Calculation: 440 R Axis:   3 Text Interpretation:  Sinus rhythm Low voltage, precordial leads  Borderline T abnormalities, anterior leads T-wave abnormalities new  compared to EKG 2003 Confirmed by Taylan Mayhan MD, Brandilee Pies [id number]) on 11/09/2015  11:48:50 AM      MDM   Final diagnoses:  Near syncope   70 year old female who presents with near syncopal episode in setting of severe abdominal pain, which seems resolved now. She is HD stable, well appearing  and asymptomatic. Was orthostatic prior to arrival, but after fluids, she has negative orthostasis. No history of GI bleeding and no chest pain or sob. CT with normal aorta and ? Of bowel wall thickening suggestive of edema. Unclear if her abdominal pain and diarrhea from ? Of colitis. It sounds like she had vagal response to her pain. Did give cipro and instructed to take if persistent diarrhea and abdominal pain. Stable on further ED observation and felt to be appropriate for outpatient supportive care. Strict return and follow-up instructions reviewed. She expressed understanding of all  discharge instructions and felt comfortable with the plan of care.     Lavera Guise, MD 11/11/15 [phone]

## 2015-11-09 NOTE — Discharge Instructions (Signed)
Return without fail for worsening symptoms, including worsening pain, recurrent passing out, vomiting and unable to keep down any food/fluids, chest pain or difficulty breathing, or any other symptoms concerning to you.  Your CT scan does possible infection of the colon but it can also be seen in a empty undistended intestine. Take antibiotics only if you have persistent abdominal pain and diarrhea. Otherwise, do not take.   Near-Syncope Near-syncope (commonly known as near fainting) is sudden weakness, dizziness, or feeling like you might pass out. During an episode of near-syncope, you may also develop pale skin, have tunnel vision, or feel sick to your stomach (nauseous). Near-syncope may occur when getting up after sitting or while standing for a long time. It is caused by a sudden decrease in blood flow to the brain. This decrease can result from various causes or triggers, most of which are not serious. However, because near-syncope can sometimes be a sign of something serious, a medical evaluation is required. The specific cause is often not determined. HOME CARE INSTRUCTIONS  Monitor your condition for any changes. The following actions may help to alleviate any discomfort you are experiencing:  Have someone stay with you until you feel stable.  Lie down right away and prop your feet up if you start feeling like you might faint. Breathe deeply and steadily. Wait until all the symptoms have passed. Most of these episodes last only a few minutes. You may feel tired for several hours.   Drink enough fluids to keep your urine clear or pale yellow.   If you are taking blood pressure or heart medicine, get up slowly when seated or lying down. Take several minutes to sit and then stand. This can reduce dizziness.  Follow up with your health care provider as directed. SEEK IMMEDIATE MEDICAL CARE IF:   You have a severe headache.   You have unusual pain in the chest, abdomen, or back.    You are bleeding from the mouth or rectum, or you have black or tarry stool.   You have an irregular or very fast heartbeat.   You have repeated fainting or have seizure-like jerking during an episode.   You faint when sitting or lying down.   You have confusion.   You have difficulty walking.   You have severe weakness.   You have vision problems.  MAKE SURE YOU:   Understand these instructions.  Will watch your condition.  Will get help right away if you are not doing well or get worse.   This information is not intended to replace advice given to you by your health care provider. Make sure you discuss any questions you have with your health care provider.   Document Released: 04/28/2005 Document Revised: 05/03/2013 Document Reviewed: 10/01/2012 Elsevier Interactive Patient Education [employer]2016 [employer] Inc.

## 2015-11-09 NOTE — ED Notes (Signed)
Pt transported to CT ?

## 2015-11-09 NOTE — ED Notes (Signed)
Bed: RU04WA11 Expected date:  Expected time:  Means of arrival:  Comments: 40F/dizziness while having a BM/BP changes

## 2015-12-25 ENCOUNTER — Telehealth: Payer: Self-pay | Admitting: Family Medicine

## 2015-12-25 ENCOUNTER — Other Ambulatory Visit: Payer: 59

## 2015-12-25 NOTE — Telephone Encounter (Signed)
ok 

## 2015-12-25 NOTE — Telephone Encounter (Signed)
Ok to transfer. 

## 2015-12-25 NOTE — Telephone Encounter (Signed)
Pt would like to switch providers. She says that the [facility] office is closer for her.

## 2016-01-17 ENCOUNTER — Other Ambulatory Visit: Payer: Self-pay | Admitting: Family Medicine

## 2016-01-18 ENCOUNTER — Ambulatory Visit: Payer: 59 | Admitting: Internal Medicine

## 2016-01-24 ENCOUNTER — Ambulatory Visit (INDEPENDENT_AMBULATORY_CARE_PROVIDER_SITE_OTHER): Payer: 59 | Admitting: Internal Medicine

## 2016-01-24 ENCOUNTER — Encounter: Payer: Self-pay | Admitting: Internal Medicine

## 2016-01-24 DIAGNOSIS — J4520 Mild intermittent asthma, uncomplicated: Secondary | ICD-10-CM | POA: Diagnosis not present

## 2016-01-24 DIAGNOSIS — J3089 Other allergic rhinitis: Principal | ICD-10-CM

## 2016-01-24 DIAGNOSIS — J309 Allergic rhinitis, unspecified: Secondary | ICD-10-CM

## 2016-01-24 DIAGNOSIS — J302 Other seasonal allergic rhinitis: Secondary | ICD-10-CM

## 2016-01-24 MED ORDER — METHYLPREDNISOLONE ACETATE 80 MG/ML IJ SUSP
80.0000 mg | Freq: Once | INTRAMUSCULAR | Status: AC
Start: 2016-01-24 — End: 2016-01-24
  Administered 2016-01-24: 80 mg via INTRAMUSCULAR

## 2016-01-24 MED ORDER — PHENYLEPHRINE HCL 1 % NA SOLN
3.0000 [drp] | Freq: Once | NASAL | Status: AC
  Administered 2016-01-24: 3 [drp] via NASAL

## 2016-01-24 NOTE — Assessment & Plan Note (Signed)
Timing and symptoms consistent with seasonal flare of allergic rhinitis although it is early in her course. Viral infection can't be excluded. Plan-nasal nebulizer decongestant, Depo-Medrol. Discussed options for Sudafed, Flonase.

## 2016-01-24 NOTE — Assessment & Plan Note (Signed)
Asthma mild intermittent very well controlled. No recent need for rescue inhaler and no sleep disturbance.

## 2016-01-24 NOTE — Patient Instructions (Signed)
Neb neo nasal      Seasonal and perennial allergic rhinitis  Depo 80  If these aren't enough, suggest adding sudafed and/ or otc flonase nasal spray

## 2016-01-24 NOTE — Addendum Note (Signed)
Addended by: Reynaldo MiniumWELCHEL, [name] C on: 01/24/2016 11:48 AM   Modules accepted: Orders

## 2016-01-24 NOTE — Progress Notes (Signed)
HPI   F never smoker followed for allergic rhinitis, asthma   [birth year]-68 yoF never smoker followed for allergic rhinitis, asthma FOLLOWS FOR: Has been off vaccine since 03-2013 because of insurance change-can tell that she has been off of them; however  needs our assistance with insurance in getting her back on vaccine. She can't afford her share of the cost of allergy vaccine after insurance. She very much wants to restart vaccine if something can be worked out. Has perennial sneeze, itch and drainage. Had sinusitis twice during the springtime which is worse than her usual. She has been using Allegra and Mucinex. Also using Afrin which we discussed. She has another letter from her insurance that she is going to bring for us to see. Has had flu shot.  01/18/15-68 yoF never smoker followed for allergic rhinitis, asthma FOLLOWS FOR:  pt stated that she has not been having any problems to speak of   Had flu vaccine. Using daily Mucinex with no other meds and feels well. Has no rescue inhaler. Denies sleep disturbance or wheeze. Had stopped allergy vaccine last year because of insurance  05/26/2015-70 year old female never smoker followed for Allergic rhinitis, Asthma FOLLOWS FOR: Started having cough, throat itching, and ears popping since yesterday. She doesn't feel she has an infection and has had no recent sick contact not helped by antihistamines yet and she would like a treatment. Already had flu shot. Chest feels clear. No recent need for rescue inhaler.  ROS-see HPi Constitutional:   No-   weight loss, night sweats, fevers, chills, fatigue, lassitude. HEENT:   No-  headaches, difficulty swallowing, tooth/dental problems, sore throat,       No-  Sneezing, itching, ear ache, +nasal congestion, post nasal drip,  CV:  No-   chest pain, orthopnea, PND, swelling in lower extremities, anasarca, dizziness, palpitations Resp: No-   shortness of breath with exertion or at rest.              No-    productive cough,  No non-productive cough,  No- coughing up of blood.              No-   change in color of mucus.  No- wheezing.   Skin: No-   rash or lesions. GI:  No-   heartburn, indigestion, abdominal pain, nausea, vomiting,  GU:  MS:  No-   joint pain or swelling.  . Neuro-     nothing unusual Psych:  No- change in mood or affect. No depression or anxiety.  No memory loss.  General- Alert, Oriented, Affect-appropriate, Distress- none acute Skin- rash-none, lesions- none, excoriation- none Lymphadenopathy- none Head- atraumatic            Eyes- Gross vision intact, PERRLA, conjunctivae clear secretions            Ears- Hearing, canals-normal            Nose- + mild turbinate edema, no-Septal dev,polyps, erosion, perforation             Throat- Mallampati IV , mucosa clear , drainage- none, tonsils- atrophic Neck- flexible , trachea midline, no stridor , thyroid nl, carotid no bruit Chest - symmetrical excursion , unlabored           Heart/CV- RRR , no murmur , no gallop  , no rub, nl s1 s2                           -  JVD- none , edema- none, stasis changes- none, varices- none           Lung- clear to P&A, wheeze- none, cough-none , dullness-none, rub- none           Chest wall-  Abd-  Br/ Gen/ Rectal- Not done, not indicated Extrem- cyanosis- none, clubbing, none, atrophy- none, strength- nl Neuro- grossly intact to observation

## 2016-01-29 ENCOUNTER — Telehealth: Payer: Self-pay | Admitting: Internal Medicine

## 2016-01-29 MED ORDER — DOXYCYCLINE HYCLATE 100 MG PO TABS: ORAL_TABLET | ORAL | 0 refills | Status: DC

## 2016-01-29 NOTE — Telephone Encounter (Signed)
Offer doxycycline 100 mg, # 8, 2 today then one daily 

## 2016-01-29 NOTE — Telephone Encounter (Signed)
Pt calling back stating that her congestion is worsening and she is having increased cough and wheezing. Pt c/o mucus production yellow in color. Pt denies fever. Seen 01/24/16 given Depo and nasal treatment - no improvement. Requesting antibiotic be called into [facility] [address].  Please advise Dr Maple HudsonYoung. Thanks.     Medication List       Accurate as of 01/29/16  9:17 AM. Always use your most recent med list.          CITRUS CALCIUM 1500 + D PO Take 1 tablet by mouth daily.   estradiol 0.025 MG/24HR Commonly known as:  VIVELLE-DOT Place 1 patch onto the skin 2 (two) times a week.   levothyroxine 88 MCG tablet Commonly known as:  SYNTHROID, LEVOTHROID Take 1 tablet (88 mcg total) by mouth daily.   losartan 100 MG tablet Commonly known as:  COZAAR TAKE 1 TABLET (100 MG TOTAL) BY MOUTH DAILY.   metoprolol succinate 100 MG 24 hr tablet Commonly known as:  TOPROL-XL Take 1 tablet (100 mg total) by mouth daily.   nortriptyline 10 MG capsule Commonly known as:  PAMELOR Take 10 mg by mouth at bedtime as needed for sleep.   Vitamin D3 2000 units Tabs Take 2,000 Units by mouth daily.   zolpidem 10 MG tablet Commonly known as:  AMBIEN Take 10 mg by mouth at bedtime.      Allergies  Allergen Reactions  . Influenza Vac Split Quad Other (See Comments)    Size of grapefruit red, swollen area at injection site  Leg cramps  Vaccine given was Fluarix Quadrivalent 2014/2015 formula by [employer]  . Penicillins Hives    Has patient had a PCN reaction causing immediate rash, facial/tongue/throat swelling, SOB or lightheadedness with hypotension: yes Has patient had a PCN reaction causing severe rash involving mucus membranes or skin necrosis: no Has patient had a PCN reaction that required hospitalization: no Has patient had a PCN reaction occurring within the last 10 years: no If all of the above answers are "NO", then may proceed with Cephalosporin use.   . Sulfonamide  Derivatives Hives

## 2016-01-29 NOTE — Telephone Encounter (Signed)
Spoke with pt. She is aware of CY's recommendation. Rx has been sent in. Nothing further was needed.  

## 2016-01-31 ENCOUNTER — Encounter: Payer: Self-pay | Admitting: Family Medicine

## 2016-01-31 ENCOUNTER — Ambulatory Visit (INDEPENDENT_AMBULATORY_CARE_PROVIDER_SITE_OTHER): Payer: 59 | Admitting: Family Medicine

## 2016-01-31 VITALS — BP 130/92 | HR 95 | Temp 97.6°F | Ht 59.5 in | Wt 151.0 lb

## 2016-01-31 DIAGNOSIS — I10 Essential (primary) hypertension: Secondary | ICD-10-CM

## 2016-01-31 DIAGNOSIS — E038 Other specified hypothyroidism: Secondary | ICD-10-CM | POA: Diagnosis not present

## 2016-01-31 DIAGNOSIS — E034 Atrophy of thyroid (acquired): Secondary | ICD-10-CM | POA: Diagnosis not present

## 2016-01-31 MED ORDER — LOSARTAN POTASSIUM 100 MG PO TABS: ORAL_TABLET | ORAL | 3 refills | Status: DC

## 2016-01-31 MED ORDER — METOPROLOL SUCCINATE ER 100 MG PO TB24: 100.0000 mg | ORAL_TABLET | Freq: Every day | ORAL | 3 refills | Status: DC

## 2016-01-31 NOTE — Progress Notes (Signed)
[facility] [address], [address] [place], [place] [id number] [phone] [phone]  Date:  01/31/2016   Name:  Alicia Staggerseggy U Perotti   DOB:  [birth year]   MRN:  [id number]  PCP:  Abbe AmsterdamOPLAND,Loni Delbridge, MD    Chief Complaint: Establish Care (Former pt of Dr. Beverely Lowabori. Here to est care. Had flu vaccine at [facility]. Will need refills. )   History of Present Illness:  Alicia Wallace is a 70 y.o. very pleasant female patient who presents with the following:  History of HTN, asthma, hypothyroidism, osteopenia.   She sees Dr. Maple HudsonYoung for her pulmonology needs. He recently treated her for a sinus infection.  She had a shot of steroids and is using doxycycline.  Her breathing has been ok.   She [facility]  She has been stable on her thyroid med dose for some time.   BP Readings from Last 3 Encounters:  01/31/16 (!) 146/84  01/24/16 122/68  11/09/15 121/70   She does check her BP at home, daily.  She will get about 120/80 on a regular basis  She uses nortriptyline for CTS pain. She never had surgery.  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     Patient Active Problem List   Diagnosis Date Noted  . Other and unspecified hyperlipidemia 08/22/2013  . Routine general medical examination at a health care facility 02/17/2013  . HTN (hypertension) 04/06/2012  . Osteopenia 04/06/2012  . Seasonal and perennial allergic rhinitis 07/15/2010  . HYPOTHYROIDISM 05/30/2008  . INFLUENZA LIKE ILLNESS 05/30/2008  . RHINOSINUSITIS, ACUTE 02/16/2008  . Asthma, mild intermittent 07/14/2007    Past Medical History:  Diagnosis Date  . Allergic rhinitis   . Asthma   . Hypertension   . Hypothyroid     Past Surgical History:  Procedure Laterality Date  . APPENDECTOMY    .  CHOLECYSTECTOMY    . TONSILLECTOMY    . TOTAL ABDOMINAL HYSTERECTOMY W/ BILATERAL SALPINGOOPHORECTOMY      Social History  Substance Use Topics  . Smoking status: Never Smoker  . Smokeless tobacco: Not on file  . Alcohol use No    Family History  Problem Relation Age of Onset  . Kidney disease Mother   . Heart disease Father   . Cancer Father     bladder  . Asthma Father   . Allergies Father     Allergies  Allergen Reactions  . Influenza Vac Split Quad Other (See Comments)    Size of grapefruit red, swollen area at injection site  Leg cramps  Vaccine given was Fluarix Quadrivalent 2014/2015 formula by [employer]  . Penicillins Hives    Has patient had a PCN reaction causing immediate rash, facial/tongue/throat swelling, SOB or lightheadedness with hypotension: yes Has patient had a PCN reaction causing severe rash involving mucus membranes or skin necrosis: no Has patient had a PCN reaction that required hospitalization: no Has patient had a PCN reaction occurring within the last 10 years: no If all of the above answers are "NO", then may proceed with Cephalosporin use.   . Sulfonamide Derivatives Hives    Medication list has been reviewed and updated.  Current Outpatient Prescriptions on File Prior to Visit  Medication Sig Dispense Refill  .  Calcium Citrate-Vitamin D (CITRUS CALCIUM 1500 + D PO) Take 1 tablet by mouth daily.     . Cholecalciferol (VITAMIN D3) 2000 UNITS TABS Take 2,000 Units by mouth daily.     Marland Kitchen doxycycline (VIBRA-TABS) 100 MG tablet Take 2 tablets today, then one daily until gone 8 tablet 0  . estradiol (VIVELLE-DOT) 0.025 MG/24HR Place 1 patch onto the skin 2 (two) times a week.    . levothyroxine (SYNTHROID, LEVOTHROID) 88 MCG tablet Take 1 tablet (88 mcg total) by mouth daily. 30 tablet 5  . losartan (COZAAR) 100 MG tablet TAKE 1 TABLET (100 MG TOTAL) BY MOUTH DAILY. 90 tablet 1  . metoprolol succinate (TOPROL-XL) 100 MG 24 hr tablet Take 1 tablet  (100 mg total) by mouth daily. 90 tablet 1  . nortriptyline (PAMELOR) 10 MG capsule Take 10 mg by mouth at bedtime as needed for sleep.     Marland Kitchen zolpidem (AMBIEN) 10 MG tablet Take 10 mg by mouth at bedtime.  5   No current facility-administered medications on file prior to visit.     Review of Systems:  As per HPI- otherwise negative.  No CP, no SOB, no fever or chills, no nausea, vomiting or diarrheaestablish ca   Physical Examination: Vitals:   01/31/16 0900 01/31/16 0907  BP: (!) 141/92 (!) 146/84  Pulse: 95   Temp: 97.6 F (36.4 C)    Vitals:   01/31/16 0900  Weight: 151 lb (68.5 kg)  Height: 4' 11.5" (1.511 m)   Body mass index is 29.99 kg/m. Ideal Body Weight: Weight in (lb) to have BMI = 25: 125.6  GEN: WDWN, NAD, Non-toxic, A & O x 3, overweight, looks well HEENT: Atraumatic, Normocephalic. Neck supple. No masses, No LAD. Bilateral TM wnl, oropharynx normal.  PEERL,EOMI.   Ears and Nose: No external deformity. CV: RRR, No M/G/R. No JVD. No thrill. No extra heart sounds. PULM: CTA B, no wheezes, crackles, rhonchi. No retractions. No resp. distress. No accessory muscle use. EXTR: No c/c/e NEURO Normal gait.  PSYCH: Normally interactive. Conversant. Not depressed or anxious appearing.  Calm demeanor.    Assessment and Plan: Hypothyroidism due to acquired atrophy of thyroid  Essential hypertension - Plan: losartan (COZAAR) 100 MG tablet, metoprolol succinate (TOPROL-XL) 100 MG 24 hr tablet  Her TSH check is UTD- will do at next visit Refilled her BP medications.  Her reading is a bit high today but this may be due to recent illness.,  She does not have any sx of HTN. She will continue to monitor this at home and let me know if not back to normal soon   Signed Abbe Amsterdam, MD

## 2016-01-31 NOTE — Patient Instructions (Signed)
It was nice to meet you today- continue to take your blood pressure medications, and let me know if your home readings do not get back to normal!   Let's plan to do a physical in the spring- your last physical was in February

## 2016-04-21 ENCOUNTER — Other Ambulatory Visit: Payer: Self-pay | Admitting: Family Medicine

## 2016-06-01 DIAGNOSIS — K529 Noninfective gastroenteritis and colitis, unspecified: Secondary | ICD-10-CM | POA: Diagnosis not present

## 2016-06-30 ENCOUNTER — Encounter: Payer: 59 | Admitting: Family Medicine

## 2016-07-17 ENCOUNTER — Encounter: Payer: 59 | Admitting: Family Medicine

## 2016-07-23 DIAGNOSIS — Z01419 Encounter for gynecological examination (general) (routine) without abnormal findings: Secondary | ICD-10-CM | POA: Diagnosis not present

## 2016-08-26 NOTE — Progress Notes (Signed)
[facility] at [facility] [address], [address] [place], [place] [address] [phone]  Date:  08/27/2016   Name:  Alicia Wallace   DOB:  [birth year]   MRN:  [id number]  PCP:  Abbe Amsterdam, MD    Chief Complaint: Annual Exam (Last PAP: March 2018, Last Mammogram and Bone Density Dec 2017. Refills needed on Levothyroxine and Losartan. )   History of Present Illness:  Alicia Wallace is a 71 y.o. very pleasant female patient who presents with the following:  Last seen by myself in September with the following HPI:  History of HTN, asthma, hypothyroidism, osteopenia.   She sees Dr. Maple Hudson for her pulmonology needs. He recently treated her for a sinus infection.  She had a shot of steroids and is using doxycycline.  Her breathing has been ok.   She sees Dr.Beth Maurice March at [facility] for her GYN needs  She has been stable on her thyroid med dose for some time.      BP Readings from Last 3 Encounters:  01/31/16 (!) 146/84  01/24/16 122/68  11/09/15 121/70   She does check her BP at home, daily.  She will get about 120/80 on a regular basis  She uses nortriptyline for CTS pain. She never had surgery.  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  She has tried to maintain this weight loss  now that she is feeling beter  Wt Readings from Last 3 Encounters:  08/27/16 144 lb 3.2 oz (65.4 kg)  01/31/16 151 lb (68.5 kg)  01/24/16 151 lb 12.8 oz (68.9 kg)   She uses nortriptyline for her hands/ CTS She uses ambien for sleep as needed   Reviewed NCCSR re: Remus Loffler Last filled this on 3/28- 10 mg, #30 She fills this about once a month  Patient Active Problem List   Diagnosis Date Noted  . Other and unspecified hyperlipidemia 08/22/2013  . HTN (hypertension) 04/06/2012  . Osteopenia 04/06/2012  . Seasonal and perennial allergic rhinitis 07/15/2010  . HYPOTHYROIDISM 05/30/2008  . Asthma, mild intermittent 07/14/2007    Past Medical History:  Diagnosis Date  . Allergic rhinitis   . Asthma   . Hypertension   . Hypothyroid     Past Surgical History:  Procedure Laterality Date  . APPENDECTOMY    . CHOLECYSTECTOMY    . TONSILLECTOMY    . TOTAL ABDOMINAL HYSTERECTOMY W/ BILATERAL SALPINGOOPHORECTOMY      Social History  Substance Use Topics  . Smoking status: Never Smoker  . Smokeless tobacco: Not on file  . Alcohol use No    Family History  Problem Relation Age of Onset  . Kidney disease Mother   .  Heart disease Father   . Cancer Father     bladder  . Asthma Father   . Allergies Father     Allergies  Allergen Reactions  . Influenza Vac Split Quad Other (See Comments)    Size of grapefruit red, swollen area at injection site  Leg cramps  Vaccine given was Fluarix Quadrivalent 2014/2015 formula by [employer]  . Penicillins Hives    Has patient had a PCN reaction causing immediate rash, facial/tongue/throat swelling, SOB or lightheadedness with hypotension: yes Has patient had a PCN reaction causing severe rash involving mucus membranes or skin necrosis: no Has patient had a PCN reaction that required hospitalization: no Has patient had a PCN reaction occurring within the last 10 years: no If all of the above answers are "NO", then may proceed with  Cephalosporin use.   . Sulfonamide Derivatives Hives    Medication list has been reviewed and updated.  Current Outpatient Prescriptions on File Prior to Visit  Medication Sig Dispense Refill  . Calcium Citrate-Vitamin D (CITRUS CALCIUM 1500 + D PO) Take 1 tablet by mouth daily.     . Cholecalciferol (VITAMIN D3) 2000 UNITS TABS Take 2,000 Units by mouth daily.     Marland Kitchen estradiol (VIVELLE-DOT) 0.025 MG/24HR Place 1 patch onto the skin 2 (two) times a week.    . levothyroxine (SYNTHROID, LEVOTHROID) 88 MCG tablet TAKE 1 TABLET (88 MCG TOTAL) BY MOUTH DAILY. 90 tablet 2  . losartan (COZAAR) 100 MG tablet TAKE 1 TABLET (100 MG TOTAL) BY MOUTH DAILY. 90 tablet 3  . metoprolol succinate (TOPROL-XL) 100 MG 24 hr tablet Take 1 tablet (100 mg total) by mouth daily. 90 tablet 3  . nortriptyline (PAMELOR) 10 MG capsule Take 10 mg by mouth at bedtime as needed for sleep.     Marland Kitchen zolpidem (AMBIEN) 10 MG tablet Take 10 mg by mouth at bedtime.  5   No current facility-administered medications on file prior to visit.     Review of Systems:  As per HPI- otherwise negative.   Physical Examination: Vitals:   08/27/16 0837  BP: 132/82  Pulse: 98  Temp: 97.6 F (36.4 C)   Vitals:   08/27/16 0837  Weight: 144 lb 3.2 oz (65.4 kg)  Height: 4' 11.5" (1.511 m)   Body mass index is 28.64 kg/m. Ideal Body Weight: Weight in (lb) to have BMI = 25: 125.6  GEN: WDWN, NAD, Non-toxic, A & O x 3, looks well, overweight. Appears younger than age   HEENT: Atraumatic, Normocephalic. Neck supple. No masses, No LAD.  Bilateral TM wnl, oropharynx normal.  PEERL,EOMI.   Ears and Nose: No external deformity. CV: RRR, No M/G/R. No JVD. No thrill. No extra heart sounds. PULM: CTA B, no wheezes, crackles, rhonchi. No retractions. No resp. distress. No accessory muscle use. ABD: S, NT, ND. No rebound. No HSM. EXTR: No c/c/e NEURO Normal gait.  PSYCH: Normally interactive. Conversant. Not depressed or anxious  appearing.  Calm demeanor.  No concerning skin findings   Assessment and Plan: Physical exam  Encounter for hepatitis C screening test for low risk patient - Plan: Hepatitis C antibody  Hypothyroidism due to acquired atrophy of thyroid - Plan: TSH, levothyroxine (SYNTHROID, LEVOTHROID) 88 MCG tablet  Osteopenia, unspecified location - Plan: Vitamin D (25 hydroxy)  Hyperlipidemia, unspecified hyperlipidemia type - Plan: Lipid panel  Screening for diabetes mellitus - Plan: Comprehensive metabolic panel, Hemoglobin A1c  Screening for deficiency anemia - Plan: CBC  Essential hypertension -  Plan: losartan (COZAAR) 100 MG tablet  Carpal tunnel syndrome, unspecified laterality - Plan: nortriptyline (PAMELOR) 10 MG capsule  Insomnia, unspecified type - Plan: zolpidem (AMBIEN) 10 MG tablet  CPE as above Labs pending Refilled medications Encouraged her to try cutting down on her ambien to 5 mg- she will try this See patient instructions for more details.     Signed Abbe Amsterdam, MD

## 2016-08-27 ENCOUNTER — Ambulatory Visit (INDEPENDENT_AMBULATORY_CARE_PROVIDER_SITE_OTHER): Payer: 59 | Admitting: Family Medicine

## 2016-08-27 VITALS — BP 132/82 | HR 98 | Temp 97.6°F | Ht 59.5 in | Wt 144.2 lb

## 2016-08-27 DIAGNOSIS — I10 Essential (primary) hypertension: Secondary | ICD-10-CM | POA: Diagnosis not present

## 2016-08-27 DIAGNOSIS — G5600 Carpal tunnel syndrome, unspecified upper limb: Secondary | ICD-10-CM

## 2016-08-27 DIAGNOSIS — E034 Atrophy of thyroid (acquired): Secondary | ICD-10-CM | POA: Diagnosis not present

## 2016-08-27 DIAGNOSIS — Z130 Encounter for screening for diseases of the blood and blood-forming organs and certain disorders involving the immune mechanism: Secondary | ICD-10-CM

## 2016-08-27 DIAGNOSIS — E785 Hyperlipidemia, unspecified: Secondary | ICD-10-CM

## 2016-08-27 DIAGNOSIS — Z131 Encounter for screening for diabetes mellitus: Secondary | ICD-10-CM

## 2016-08-27 DIAGNOSIS — Z0000 Encounter for general adult medical examination without abnormal findings: Secondary | ICD-10-CM | POA: Diagnosis not present

## 2016-08-27 DIAGNOSIS — Z1159 Encounter for screening for other viral diseases: Secondary | ICD-10-CM

## 2016-08-27 DIAGNOSIS — G4700 Insomnia, unspecified: Secondary | ICD-10-CM

## 2016-08-27 DIAGNOSIS — M8580 Other specified disorders of bone density and structure, unspecified site: Secondary | ICD-10-CM

## 2016-08-27 LAB — COMPREHENSIVE METABOLIC PANEL
ALT: 18 U/L (ref 0–35)
AST: 19 U/L (ref 0–37)
Albumin: 4.1 g/dL (ref 3.5–5.2)
Alkaline Phosphatase: 66 U/L (ref 39–117)
BILIRUBIN TOTAL: 0.3 mg/dL (ref 0.2–1.2)
BUN: 11 mg/dL (ref 6–23)
CALCIUM: 9.7 mg/dL (ref 8.4–10.5)
CHLORIDE: 102 meq/L (ref 96–112)
CO2: 29 meq/L (ref 19–32)
Creatinine, Ser: 0.67 mg/dL (ref 0.40–1.20)
GFR: 92.74 mL/min (ref >60.00)
Glucose, Bld: 95 mg/dL (ref 70–99)
POTASSIUM: 4 meq/L (ref 3.5–5.1)
Sodium: 138 mEq/L (ref 135–145)
Total Protein: 7 g/dL (ref 6.0–8.3)

## 2016-08-27 LAB — VITAMIN D 25 HYDROXY (VIT D DEFICIENCY, FRACTURES): VITD: 84.9 ng/mL (ref 30.00–100.00)

## 2016-08-27 LAB — CBC
HEMATOCRIT: 37.6 % (ref 36.0–46.0)
HEMOGLOBIN: 12.5 g/dL (ref 12.0–15.0)
MCHC: 33.2 g/dL (ref 30.0–36.0)
MCV: 92.2 fl (ref 78.0–100.0)
PLATELETS: 309 10*3/uL (ref 150.0–400.0)
RBC: 4.08 Mil/uL (ref 3.87–5.11)
RDW: 13.5 % (ref 11.5–15.5)
WBC: 7.2 10*3/uL (ref 4.0–10.5)

## 2016-08-27 LAB — TSH: TSH: 1.73 u[IU]/mL (ref 0.35–4.50)

## 2016-08-27 LAB — HEMOGLOBIN A1C: Hgb A1c MFr Bld: 6.1 % (ref 4.6–6.5)

## 2016-08-27 LAB — LIPID PANEL
CHOL/HDL RATIO: 4
Cholesterol: 205 mg/dL — ABNORMAL HIGH (ref 0–200)
HDL: 52.6 mg/dL (ref >39.00)
LDL Cholesterol: 124 mg/dL — ABNORMAL HIGH (ref 0–99)
NonHDL: 152.74
TRIGLYCERIDES: 144 mg/dL (ref 0.0–149.0)
VLDL: 28.8 mg/dL (ref 0.0–40.0)

## 2016-08-27 MED ORDER — NORTRIPTYLINE HCL 10 MG PO CAPS: 10.0000 mg | ORAL_CAPSULE | Freq: Every evening | ORAL | 3 refills | Status: DC | PRN

## 2016-08-27 MED ORDER — LOSARTAN POTASSIUM 100 MG PO TABS: ORAL_TABLET | ORAL | 3 refills | Status: DC

## 2016-08-27 MED ORDER — ZOLPIDEM TARTRATE 10 MG PO TABS: ORAL_TABLET | ORAL | 5 refills | Status: DC

## 2016-08-27 MED ORDER — LEVOTHYROXINE SODIUM 88 MCG PO TABS: 88.0000 ug | ORAL_TABLET | Freq: Every day | ORAL | 3 refills | Status: DC

## 2016-08-27 NOTE — Patient Instructions (Signed)
It was very nice to see you again today! Take care and I hope you enjoy the warmer weather I will be in touch with your labs asap I would recommend that you stick to 5 mg of ambien if possible; this would be a safer dose for you  Continue your good habits of exercise and heathy eating.   Health Maintenance for Postmenopausal Women Menopause is a normal process in which your reproductive ability comes to an end. This process happens gradually over a span of months to years, usually between the ages of 5 and 59. Menopause is complete when you have missed 12 consecutive menstrual periods. It is important to talk with your health care provider about some of the most common conditions that affect postmenopausal women, such as heart disease, cancer, and bone loss (osteoporosis). Adopting a healthy lifestyle and getting preventive care can help to promote your health and wellness. Those actions can also lower your chances of developing some of these common conditions. What should I know about menopause? During menopause, you may experience a number of symptoms, such as:  Moderate-to-severe hot flashes.  Night sweats.  Decrease in sex drive.  Mood swings.  Headaches.  Tiredness.  Irritability.  Memory problems.  Insomnia. Choosing to treat or not to treat menopausal changes is an individual decision that you make with your health care provider. What should I know about hormone replacement therapy and supplements? Hormone therapy products are effective for treating symptoms that are associated with menopause, such as hot flashes and night sweats. Hormone replacement carries certain risks, especially as you become older. If you are thinking about using estrogen or estrogen with progestin treatments, discuss the benefits and risks with your health care provider. What should I know about heart disease and stroke? Heart disease, heart attack, and stroke become more likely as you age. This may be  due, in part, to the hormonal changes that your body experiences during menopause. These can affect how your body processes dietary fats, triglycerides, and cholesterol. Heart attack and stroke are both medical emergencies. There are many things that you can do to help prevent heart disease and stroke:  Have your blood pressure checked at least every 1-2 years. High blood pressure causes heart disease and increases the risk of stroke.  If you are 75-68 years old, ask your health care provider if you should take aspirin to prevent a heart attack or a stroke.  Do not use any tobacco products, including cigarettes, chewing tobacco, or electronic cigarettes. If you need help quitting, ask your health care provider.  It is important to eat a healthy diet and maintain a healthy weight.  Be sure to include plenty of vegetables, fruits, low-fat dairy products, and lean protein.  Avoid eating foods that are high in solid fats, added sugars, or salt (sodium).  Get regular exercise. This is one of the most important things that you can do for your health.  Try to exercise for at least 150 minutes each week. The type of exercise that you do should increase your heart rate and make you sweat. This is known as moderate-intensity exercise.  Try to do strengthening exercises at least twice each week. Do these in addition to the moderate-intensity exercise.  Know your numbers.Ask your health care provider to check your cholesterol and your blood glucose. Continue to have your blood tested as directed by your health care provider. What should I know about cancer screening? There are several types of cancer. Take the  following steps to reduce your risk and to catch any cancer development as early as possible. Breast Cancer  Practice breast self-awareness.  This means understanding how your breasts normally appear and feel.  It also means doing regular breast self-exams. Let your health care provider know  about any changes, no matter how small.  If you are 55 or older, have a clinician do a breast exam (clinical breast exam or CBE) every year. Depending on your age, family history, and medical history, it may be recommended that you also have a yearly breast X-ray (mammogram).  If you have a family history of breast cancer, talk with your health care provider about genetic screening.  If you are at high risk for breast cancer, talk with your health care provider about having an MRI and a mammogram every year.  Breast cancer (BRCA) gene test is recommended for women who have family members with BRCA-related cancers. Results of the assessment will determine the need for genetic counseling and BRCA1 and for BRCA2 testing. BRCA-related cancers include these types:  Breast. This occurs in males or females.  Ovarian.  Tubal. This may also be called fallopian tube cancer.  Cancer of the abdominal or pelvic lining (peritoneal cancer).  Prostate.  Pancreatic. Cervical, Uterine, and Ovarian Cancer  Your health care provider may recommend that you be screened regularly for cancer of the pelvic organs. These include your ovaries, uterus, and vagina. This screening involves a pelvic exam, which includes checking for microscopic changes to the surface of your cervix (Pap test).  For women ages 21-65, health care providers may recommend a pelvic exam and a Pap test every three years. For women ages 29-65, they may recommend the Pap test and pelvic exam, combined with testing for human papilloma virus (HPV), every five years. Some types of HPV increase your risk of cervical cancer. Testing for HPV may also be done on women of any age who have unclear Pap test results.  Other health care providers may not recommend any screening for nonpregnant women who are considered low risk for pelvic cancer and have no symptoms. Ask your health care provider if a screening pelvic exam is right for you.  If you have  had past treatment for cervical cancer or a condition that could lead to cancer, you need Pap tests and screening for cancer for at least 20 years after your treatment. If Pap tests have been discontinued for you, your risk factors (such as having a new sexual partner) need to be reassessed to determine if you should start having screenings again. Some women have medical problems that increase the chance of getting cervical cancer. In these cases, your health care provider may recommend that you have screening and Pap tests more often.  If you have a family history of uterine cancer or ovarian cancer, talk with your health care provider about genetic screening.  If you have vaginal bleeding after reaching menopause, tell your health care provider.  There are currently no reliable tests available to screen for ovarian cancer. Lung Cancer  Lung cancer screening is recommended for adults 110-48 years old who are at high risk for lung cancer because of a history of smoking. A yearly low-dose CT scan of the lungs is recommended if you:  Currently smoke.  Have a history of at least 30 pack-years of smoking and you currently smoke or have quit within the past 15 years. A pack-year is smoking an average of one pack of cigarettes per day  for one year. Yearly screening should:  Continue until it has been 15 years since you quit.  Stop if you develop a health problem that would prevent you from having lung cancer treatment. Colorectal Cancer  This type of cancer can be detected and can often be prevented.  Routine colorectal cancer screening usually begins at age 66 and continues through age 32.  If you have risk factors for colon cancer, your health care provider may recommend that you be screened at an earlier age.  If you have a family history of colorectal cancer, talk with your health care provider about genetic screening.  Your health care provider may also recommend using home test kits to  check for hidden blood in your stool.  A small camera at the end of a tube can be used to examine your colon directly (sigmoidoscopy or colonoscopy). This is done to check for the earliest forms of colorectal cancer.  Direct examination of the colon should be repeated every 5-10 years until age 66. However, if early forms of precancerous polyps or small growths are found or if you have a family history or genetic risk for colorectal cancer, you may need to be screened more often. Skin Cancer  Check your skin from head to toe regularly.  Monitor any moles. Be sure to tell your health care provider:  About any new moles or changes in moles, especially if there is a change in a mole's shape or color.  If you have a mole that is larger than the size of a pencil eraser.  If any of your family members has a history of skin cancer, especially at a young age, talk with your health care provider about genetic screening.  Always use sunscreen. Apply sunscreen liberally and repeatedly throughout the day.  Whenever you are outside, protect yourself by wearing long sleeves, pants, a wide-brimmed hat, and sunglasses. What should I know about osteoporosis? Osteoporosis is a condition in which bone destruction happens more quickly than new bone creation. After menopause, you may be at an increased risk for osteoporosis. To help prevent osteoporosis or the bone fractures that can happen because of osteoporosis, the following is recommended:  If you are 48-70 years old, get at least 1,000 mg of calcium and at least 600 mg of vitamin D per day.  If you are older than age 76 but younger than age 50, get at least 1,200 mg of calcium and at least 600 mg of vitamin D per day.  If you are older than age 8, get at least 1,200 mg of calcium and at least 800 mg of vitamin D per day. Smoking and excessive alcohol intake increase the risk of osteoporosis. Eat foods that are rich in calcium and vitamin D, and do  weight-bearing exercises several times each week as directed by your health care provider. What should I know about how menopause affects my mental health? Depression may occur at any age, but it is more common as you become older. Common symptoms of depression include:  Low or sad mood.  Changes in sleep patterns.  Changes in appetite or eating patterns.  Feeling an overall lack of motivation or enjoyment of activities that you previously enjoyed.  Frequent crying spells. Talk with your health care provider if you think that you are experiencing depression. What should I know about immunizations? It is important that you get and maintain your immunizations. These include:  Tetanus, diphtheria, and pertussis (Tdap) booster vaccine.  Influenza every year  before the flu season begins.  Pneumonia vaccine.  Shingles vaccine. Your health care provider may also recommend other immunizations. This information is not intended to replace advice given to you by your health care provider. Make sure you discuss any questions you have with your health care provider. Document Released: 06/20/2005 Document Revised: 11/16/2015 Document Reviewed: 01/30/2015 Elsevier Interactive Patient Education  2017 [employer].

## 2016-08-28 ENCOUNTER — Encounter: Payer: Self-pay | Admitting: Family Medicine

## 2016-08-28 LAB — HEPATITIS C ANTIBODY: HCV Ab: NEGATIVE

## 2016-09-05 ENCOUNTER — Telehealth: Payer: Self-pay | Admitting: Family Medicine

## 2016-09-05 DIAGNOSIS — G5600 Carpal tunnel syndrome, unspecified upper limb: Secondary | ICD-10-CM

## 2016-09-05 MED ORDER — NORTRIPTYLINE HCL 10 MG PO CAPS: ORAL_CAPSULE | ORAL | 3 refills | Status: DC

## 2016-09-05 NOTE — Telephone Encounter (Signed)
Corrected- called pt to let her know

## 2016-09-05 NOTE — Telephone Encounter (Signed)
Caller name: Relationship to patient: Can be reached: [phone] [facility]  Reason for call: Request call back to clarify order for Nortriptyline. Patient states she takes 2 tablets per day but Rx is written for 1.

## 2016-12-02 ENCOUNTER — Telehealth: Payer: Self-pay | Admitting: Family Medicine

## 2016-12-02 NOTE — Telephone Encounter (Signed)
°  Relation to EA:VWUJpt:self  Call back number:[phone] [id number] [address] [facility] [place], [place] [address] [phone] ([phone])     Reason for call:  Patient requesting 30 day supply Metoprolol tartrate due to the cost being cheaper and patient would like PCP insight on medication vs metoprolol succinate (TOPROL-XL) 100 MG 24 hr tablet  , please advise

## 2016-12-04 ENCOUNTER — Other Ambulatory Visit: Payer: Self-pay | Admitting: Emergency Medicine

## 2016-12-04 DIAGNOSIS — I10 Essential (primary) hypertension: Secondary | ICD-10-CM

## 2016-12-04 MED ORDER — METOPROLOL TARTRATE 50 MG PO TABS: 50.0000 mg | ORAL_TABLET | Freq: Two times a day (BID) | ORAL | 3 refills | Status: DC

## 2016-12-04 NOTE — Telephone Encounter (Signed)
[facility] that states: "Ms.Kahler's insurane now only covers Metoporolol Tartrate. Please send rx for 50 mg BID if appropriate."  Change made and sent to pharmacy.

## 2016-12-08 NOTE — Telephone Encounter (Signed)
Called pharmacist to discuss.  He stated no need to change order.  If patient prefers a 30 day supply, than she can request it from the 90 day supply sent.  He also stated that patient had already picked up 90 supply and paid $3. No additional actions needed at this time.

## 2016-12-08 NOTE — Telephone Encounter (Signed)
Metoprolol tartrate 50 mg, 2 times daily Last Filled:  12/04/16 Amt: 180, 3  Pt requesting a 30 day supply rather than 90 day. Will resend.

## 2017-01-04 DIAGNOSIS — Z23 Encounter for immunization: Secondary | ICD-10-CM | POA: Diagnosis not present

## 2017-01-16 ENCOUNTER — Telehealth: Payer: Self-pay | Admitting: Internal Medicine

## 2017-01-16 MED ORDER — DOXYCYCLINE HYCLATE 100 MG PO TABS: 100.0000 mg | ORAL_TABLET | Freq: Two times a day (BID) | ORAL | 0 refills | Status: DC

## 2017-01-16 NOTE — Telephone Encounter (Signed)
Offer doxycycline 100 mg, # 14, 1 twice daily 

## 2017-01-16 NOTE — Telephone Encounter (Signed)
Pt is a CY pt.. Pt c/o sinus infection- HA, stuffy nose, PND, sinus congeston X2 days.  Denies fever, chest congestion/pain.    Pt taking mucinex to help with s/s. Requesting further recs.  [facility] [place] [facility]  Last ov: 01/24/16 Next ov: 04/24/17  CY please advise on recs.  Thanks!

## 2017-01-16 NOTE — Telephone Encounter (Signed)
Spoke with the pt and notified of recs per CD. She verbalized understanding and nothing further needed

## 2017-01-23 ENCOUNTER — Ambulatory Visit: Payer: 59 | Admitting: Internal Medicine

## 2017-02-26 ENCOUNTER — Other Ambulatory Visit: Payer: 59

## 2017-02-26 ENCOUNTER — Ambulatory Visit: Payer: 59 | Admitting: Family Medicine

## 2017-03-04 ENCOUNTER — Ambulatory Visit: Payer: 59 | Admitting: Family Medicine

## 2017-03-17 NOTE — Progress Notes (Addendum)
[facility] [place] [address], [address] [place], [place] [address] [phone] [phone]  Date:  03/19/2017   Name:  Alicia Wallace   DOB:  [birth year]   MRN:  [id number]  PCP:  Pearline Cablesopland, Dorlisa Savino C, MD    Chief Complaint: Follow-up (Pt here for 6 month f/u visit. )   History of Present Illness:  Alicia Wallace is a 71 y.o. very pleasant female patient who presents with the following:  6 month follow-up visit History of HTN, hypothyroidism, hyperlipidemia, asthma Last seen by myself about 6 months ago:  History of HTN, asthma, hypothyroidism, osteopenia. She sees Dr. Maple HudsonYoung for her pulmonology needs. He recently treated her for a sinus infection. She had a shot of steroids and is using doxycycline.  Her breathing has been ok.  [facility]  She has been stable on her thyroid med dose for some time.      BP Readings from Last 3 Encounters:  01/31/16 (!) 146/84  01/24/16 122/68  11/09/15 121/70   She does check her BP at home, daily. She will get about 120/80 on a regular basis  She uses nortriptyline for CTS pain. She never had surgery. 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   Lab Results  Component Value Date   TSH 1.73 08/27/2016   Lipids:    Component Value Date/Time   CHOL 205 (H) 08/27/2016 0904   TRIG 144.0 08/27/2016 0904   HDL 52.60 08/27/2016 0904   LDLDIRECT 142.0 06/22/2014 1618   VLDL 28.8 08/27/2016 0904   CHOLHDL 4 08/27/2016 0904   Flu shot is current  She is doing well overall She did see Dr. Maple HudsonYoung for a sinus infection a month ago- he treated her and she is now well She is still working 40+ hours a week, has to watch her typing volume due to CTS and she does wear a brace while typing and at night  sometimes .  This keeps her sx under control Her BP tends to be a bit high here at clinic-  BP at [facility] 117/78 last week She does check her BP at home- she tends to have 120/80 at home   She does use ambien for sleep as needed- she takes it most days She states no other concerns today Patient Active Problem List   Diagnosis Date Noted  . Other and unspecified hyperlipidemia 08/22/2013  . HTN (hypertension) 04/06/2012  . Osteopenia 04/06/2012  . Seasonal and perennial allergic rhinitis 07/15/2010  . HYPOTHYROIDISM 05/30/2008  . Asthma, mild intermittent 07/14/2007    Past Medical History:  Diagnosis Date  . Allergic rhinitis   . Asthma   . Hypertension   . Hypothyroid     Past Surgical History:  Procedure Laterality Date  . APPENDECTOMY    . CHOLECYSTECTOMY    . TONSILLECTOMY    . TOTAL ABDOMINAL HYSTERECTOMY W/ BILATERAL SALPINGOOPHORECTOMY      Social History   Tobacco Use  . Smoking status: Never Smoker  Substance Use Topics  . Alcohol use: No    Alcohol/week: 0.0 oz  . Drug use: No    Family History  Problem Relation Age of Onset  . Kidney disease Mother   . Heart disease Father   .  Cancer Father        bladder  . Asthma Father   . Allergies Father     Allergies  Allergen Reactions  . Influenza Vac Split Quad Other (See Comments)    Size of grapefruit red, swollen area at injection site  Leg cramps  Vaccine given was Fluarix Quadrivalent 2014/2015 formula by [employer]  . Penicillins Hives    Has patient had a PCN reaction causing immediate rash, facial/tongue/throat swelling, SOB or lightheadedness with hypotension: yes Has patient had a PCN reaction causing severe rash involving mucus membranes or skin necrosis: no Has patient had a PCN reaction that required hospitalization: no Has patient had a PCN reaction occurring within the last 10 years: no If all of the above answers are "NO", then may proceed with Cephalosporin use.   . Sulfonamide Derivatives  Hives    Medication list has been reviewed and updated.  Current Outpatient Medications on File Prior to Visit  Medication Sig Dispense Refill  . Calcium Citrate-Vitamin D (CITRUS CALCIUM 1500 + D PO) Take 1 tablet by mouth daily.     . Cholecalciferol (VITAMIN D3) 2000 UNITS TABS Take 2,000 Units by mouth daily.     Marland Kitchen. estradiol (CLIMARA - DOSED IN MG/24 HR) 0.025 mg/24hr patch APPLY 1 PATCH ONCE WEEKLY  2  . levothyroxine (SYNTHROID, LEVOTHROID) 88 MCG tablet Take 1 tablet (88 mcg total) by mouth daily. 90 tablet 3  . losartan (COZAAR) 100 MG tablet TAKE 1 TABLET (100 MG TOTAL) BY MOUTH DAILY. 90 tablet 3  . metoprolol tartrate (LOPRESSOR) 50 MG tablet Take 1 tablet (50 mg total) by mouth 2 (two) times daily. 180 tablet 3  . nortriptyline (PAMELOR) 10 MG capsule Take 2 at bedtime as needed 180 capsule 3  . zolpidem (AMBIEN) 10 MG tablet Take 1/2 tablet at bedtime as needed for sleep. May take 1 tablet if necessary 30 tablet 5   No current facility-administered medications on file prior to visit.     Review of Systems:  As per HPI- otherwise negative.   Physical Examination: Vitals:   03/19/17 0930  BP: (!) 146/85  Pulse: 89  Temp: 98 F (36.7 C)  SpO2: 97%   Vitals:   03/19/17 0930  Weight: 144 lb 12.8 oz (65.7 kg)  Height: 4' 11.5" (1.511 m)   Body mass index is 28.76 kg/m. Ideal Body Weight: Weight in (lb) to have BMI = 25: 125.6  GEN: WDWN, NAD, Non-toxic, A & O x 3, overweight, otherwise looks well HEENT: Atraumatic, Normocephalic. Neck supple. No masses, No LAD.  Bilateral TM wnl, oropharynx normal.  PEERL,EOMI.   Ears and Nose: No external deformity. CV: RRR, No M/G/R. No JVD. No thrill. No extra heart sounds. PULM: CTA B, no wheezes, crackles, rhonchi. No retractions. No resp. distress. No accessory muscle use. EXTR: No c/c/e NEURO Normal gait.  Normal DTR all extremities  PSYCH: Normally interactive. Conversant. Not depressed or anxious appearing.  Calm  demeanor.    Assessment and Plan: Hypothyroidism due to acquired atrophy of thyroid - Plan: TSH  Essential hypertension  Hyperlipidemia, unspecified hyperlipidemia type  TSH check today Continue BP medications  Follow-up in 6 months   Signed Abbe AmsterdamJessica Justina Bertini, MD  Received her TSH- normal. Called and let her know  Results for orders placed or performed in visit on 03/19/17  TSH  Result Value Ref Range   TSH 2.88 0.35 - 4.50 uIU/mL

## 2017-03-19 ENCOUNTER — Ambulatory Visit (INDEPENDENT_AMBULATORY_CARE_PROVIDER_SITE_OTHER): Payer: 59 | Admitting: Family Medicine

## 2017-03-19 VITALS — BP 146/85 | HR 89 | Temp 98.0°F | Ht 59.5 in | Wt 144.8 lb

## 2017-03-19 DIAGNOSIS — E785 Hyperlipidemia, unspecified: Secondary | ICD-10-CM

## 2017-03-19 DIAGNOSIS — E034 Atrophy of thyroid (acquired): Secondary | ICD-10-CM | POA: Diagnosis not present

## 2017-03-19 DIAGNOSIS — I10 Essential (primary) hypertension: Secondary | ICD-10-CM | POA: Diagnosis not present

## 2017-03-19 LAB — TSH: TSH: 2.88 u[IU]/mL (ref 0.35–4.50)

## 2017-03-19 NOTE — Patient Instructions (Signed)
Please see me in about 6 months for a recheck.  Great to see you today as always!  Continue to check your BP at home and let me know if it starts going up I will be in touch with your TSH asap

## 2017-04-14 ENCOUNTER — Telehealth: Payer: Self-pay | Admitting: Internal Medicine

## 2017-04-14 MED ORDER — DOXYCYCLINE HYCLATE 100 MG PO TABS: 100.0000 mg | ORAL_TABLET | Freq: Two times a day (BID) | ORAL | 1 refills | Status: DC

## 2017-04-14 NOTE — Telephone Encounter (Signed)
Called pt and advised message from the provider. Pt understood and verbalized understanding. Nothing further is needed.    Rx sent in. 

## 2017-04-14 NOTE — Telephone Encounter (Signed)
Suggest doxycycline 100 mg, # 14, 1 twice daily, ref x 1

## 2017-04-14 NOTE — Telephone Encounter (Signed)
Spoke with pt who thinks she has developed a sinus infection. States that she has been coughing and sneezing a lot with yellow mucus and having a lot of congestion.  Pt is requesting an abx and states that usually when she develops these symptoms, she is usually put on doxycycline.  [facility] [address]  Dr. Maple HudsonYoung, please advise if it is okay for us to send in an Rx for pt.  Thanks!    Allergies  Allergen Reactions  . Influenza Vac Split Quad Other (See Comments)    Size of grapefruit red, swollen area at injection site  Leg cramps  Vaccine given was Fluarix Quadrivalent 2014/2015 formula by [employer]  . Penicillins Hives    Has patient had a PCN reaction causing immediate rash, facial/tongue/throat swelling, SOB or lightheadedness with hypotension: yes Has patient had a PCN reaction causing severe rash involving mucus membranes or skin necrosis: no Has patient had a PCN reaction that required hospitalization: no Has patient had a PCN reaction occurring within the last 10 years: no If all of the above answers are "NO", then may proceed with Cephalosporin use.   . Sulfonamide Derivatives Hives    Current Outpatient Medications:  .  Calcium Citrate-Vitamin D (CITRUS CALCIUM 1500 + D PO), Take 1 tablet by mouth daily. , Disp: , Rfl:  .  Cholecalciferol (VITAMIN D3) 2000 UNITS TABS, Take 2,000 Units by mouth daily. , Disp: , Rfl:  .  estradiol (CLIMARA - DOSED IN MG/24 HR) 0.025 mg/24hr patch, APPLY 1 PATCH ONCE WEEKLY, Disp: , Rfl: 2 .  levothyroxine (SYNTHROID, LEVOTHROID) 88 MCG tablet, Take 1 tablet (88 mcg total) by mouth daily., Disp: 90 tablet, Rfl: 3 .  losartan (COZAAR) 100 MG tablet, TAKE 1 TABLET (100 MG TOTAL) BY MOUTH DAILY., Disp: 90 tablet, Rfl: 3 .  metoprolol tartrate (LOPRESSOR) 50 MG tablet, Take 1 tablet (50 mg total) by mouth 2 (two) times daily., Disp: 180 tablet, Rfl: 3 .  nortriptyline (PAMELOR) 10 MG capsule, Take 2 at bedtime  as needed, Disp: 180 capsule, Rfl: 3 .  zolpidem (AMBIEN) 10 MG tablet, Take 1/2 tablet at bedtime as needed for sleep. May take 1 tablet if necessary, Disp: 30 tablet, Rfl: 5

## 2017-04-24 ENCOUNTER — Ambulatory Visit: Payer: 59 | Admitting: Internal Medicine

## 2017-06-13 DIAGNOSIS — Z1231 Encounter for screening mammogram for malignant neoplasm of breast: Secondary | ICD-10-CM | POA: Diagnosis not present

## 2017-06-15 IMAGING — CT CT ABD-PELV W/ CM
2 of 5 series · 16 of 46 positions shown, 18 images · IV contrast (ISOVUE)
Comparison: None

CLINICAL DATA: Sharp abdominal pain periumbilical and in upper
abdomen, near syncope, brief hypotension, asthma, prior
cholecystectomy, hysterectomy, appendectomy

EXAM:
CT ABDOMEN AND PELVIS WITH CONTRAST
TECHNIQUE: Multidetector CT imaging of the abdomen and pelvis was performed
using the standard protocol following bolus administration of
intravenous contrast. Sagittal and coronal MPR images reconstructed
from axial data set.
CONTRAST:  100mL [id number] IOPAMIDOL ([id number]) INJECTION 61% IV.
Dilute oral contrast.

[Series 2: abd/pel with · axial · 0.74mm/px · z∈[+1224,+1630]mm · 13 of 95 slices shown, 15 images]
[im 7/95  soft-tissue]
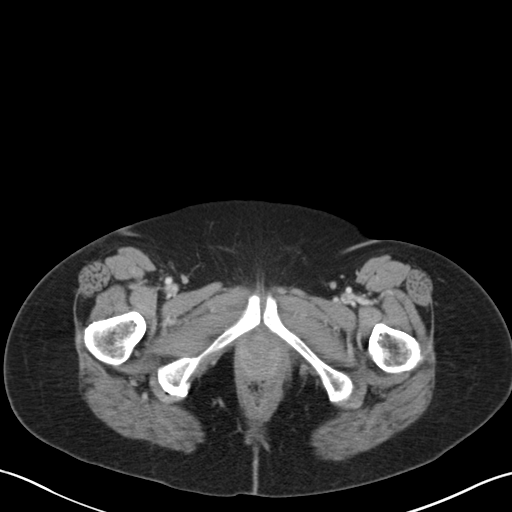
[im 7/95  bone]
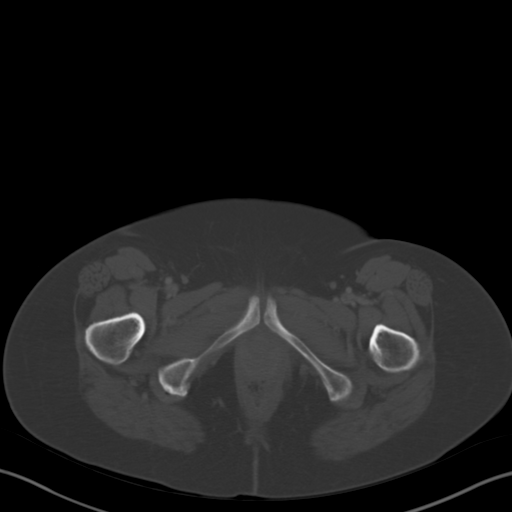
[im 13/95  soft-tissue]
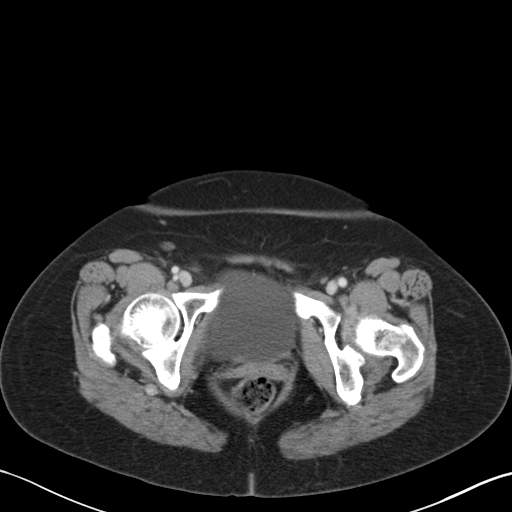
[im 19/95  soft-tissue]
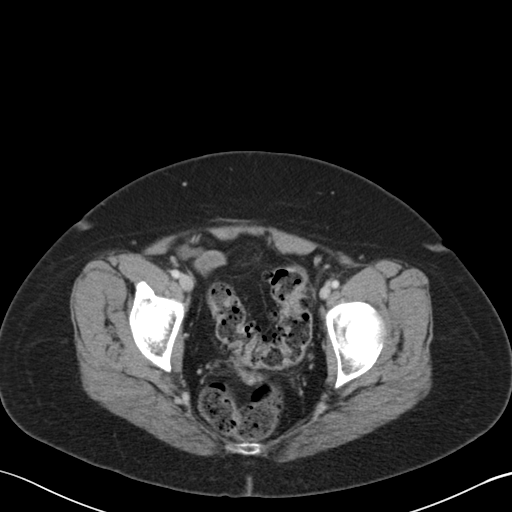
[im 26/95  soft-tissue]
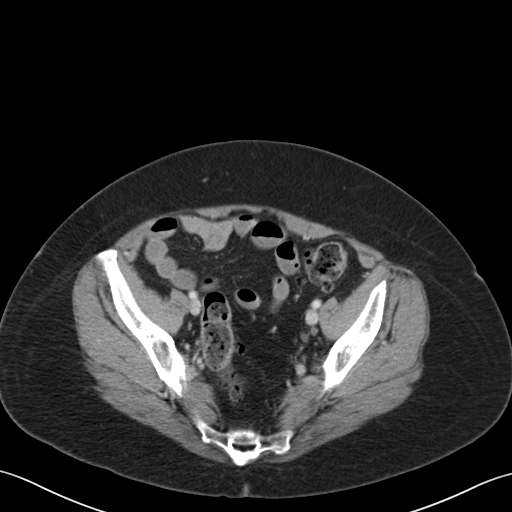
[im 32/95  soft-tissue]
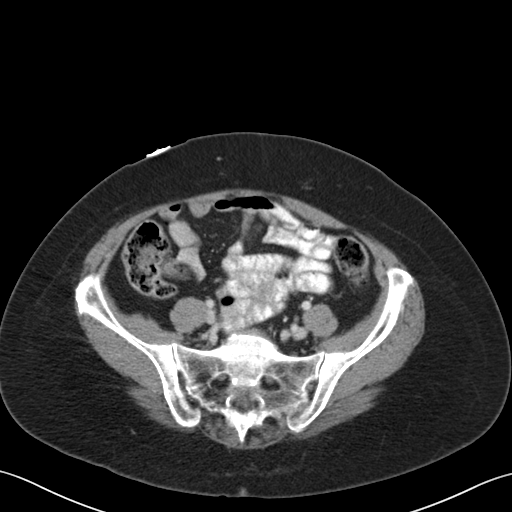
[im 38/95  soft-tissue]
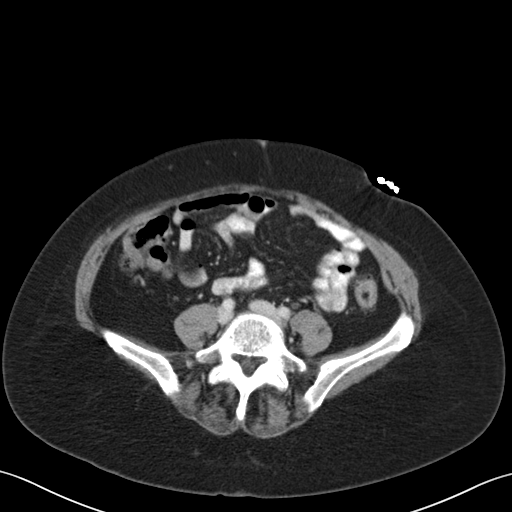
[im 51/95  soft-tissue]
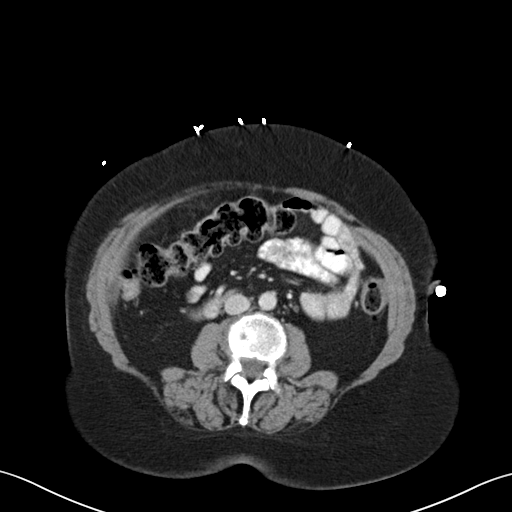
[im 57/95  soft-tissue]
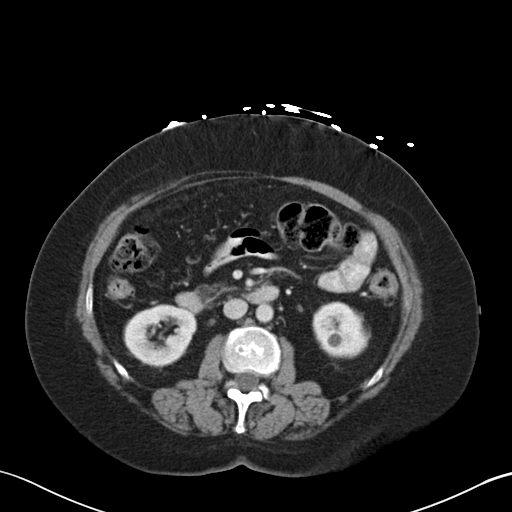
[im 63/95  soft-tissue]
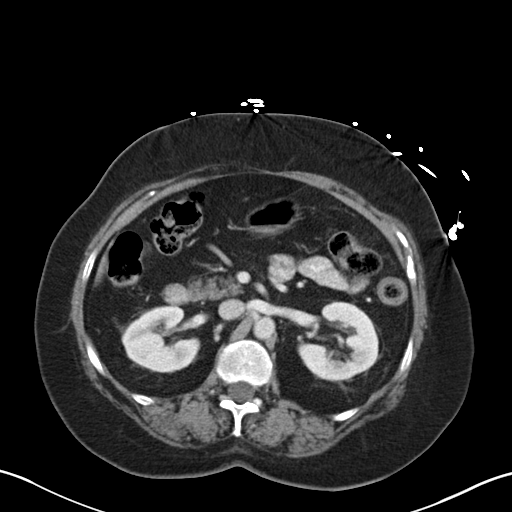
[im 63/95  bone]
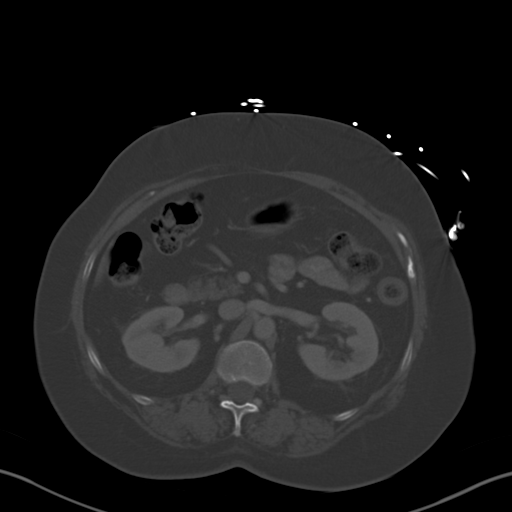
[im 69/95  soft-tissue]
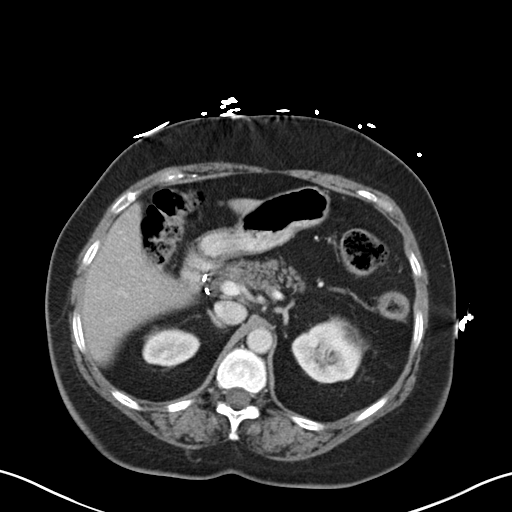
[im 76/95  soft-tissue]
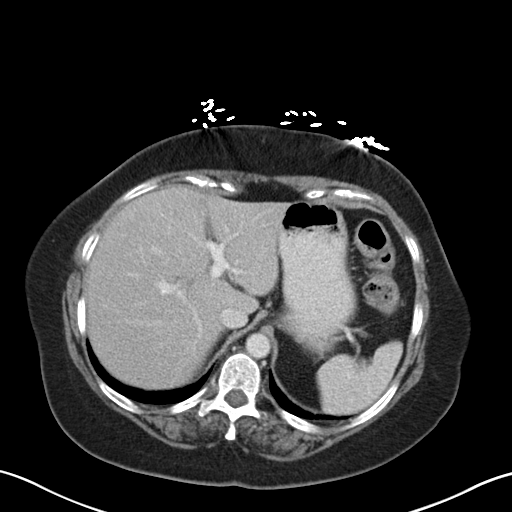
[im 82/95  soft-tissue]
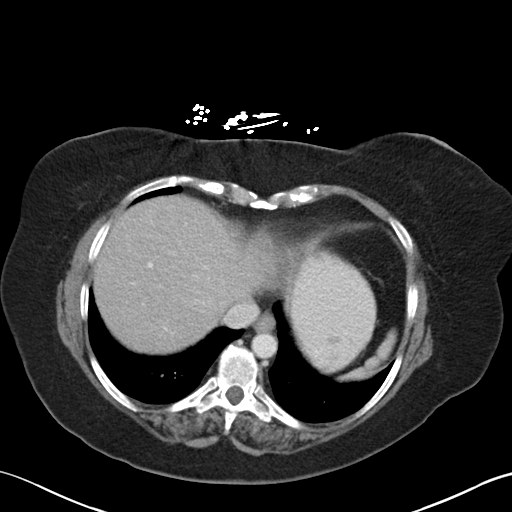
[im 88/95  soft-tissue]
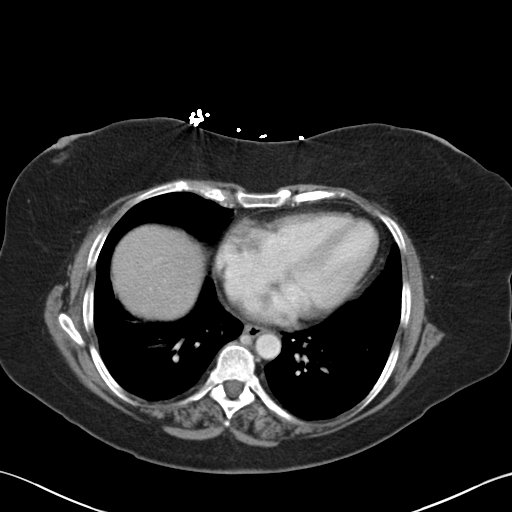

[Series 6: coronal a/|p · coronal · 0.74mm/px · 3 of 148 slices shown]
[im 50/148  soft-tissue]
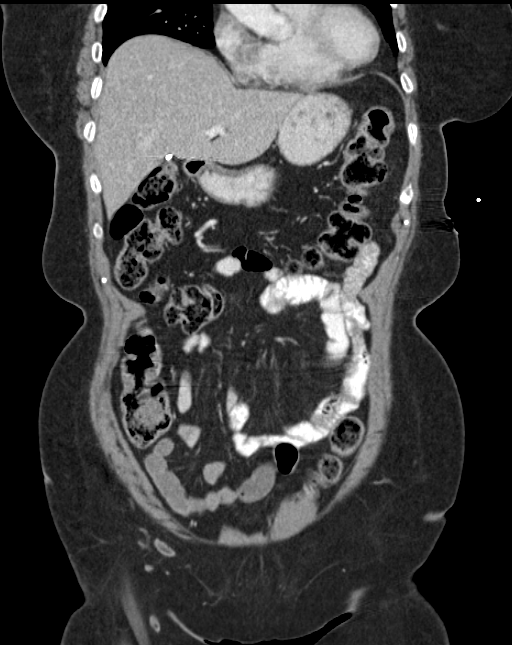
[im 66/148  soft-tissue]
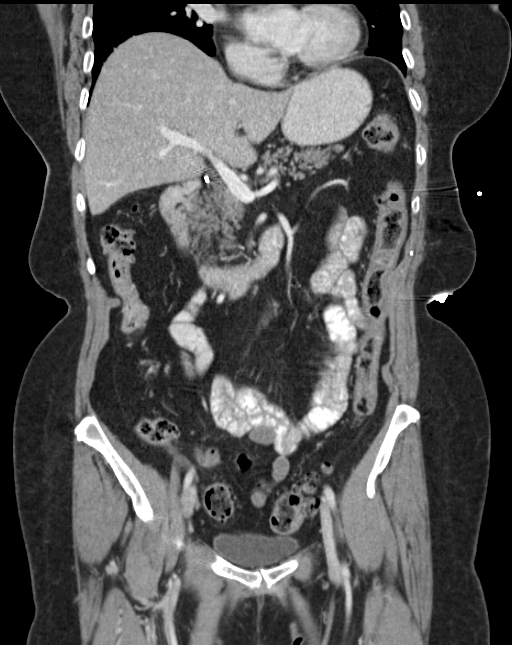
[im 82/148  soft-tissue]
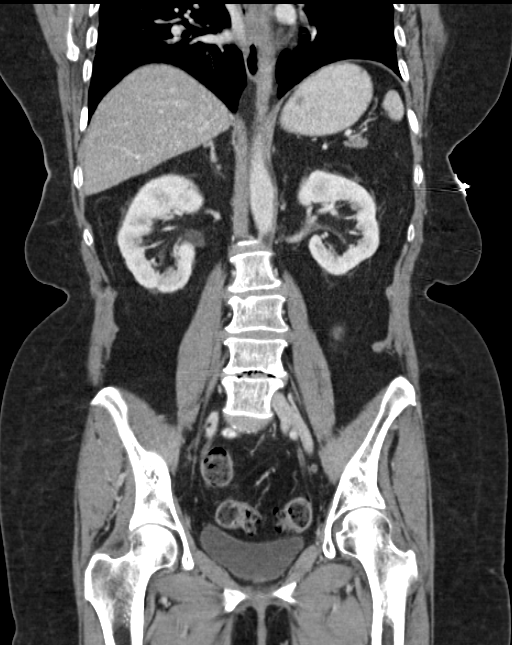

[16 of 46 positions shown; findings below may reference images not displayed]

FINDINGS: Lower chest:  Lung bases clear.

Hepatobiliary: Gallbladder surgically absent. Liver normal
appearance. No biliary dilatation.

Pancreas: Normal appearance

Spleen: Normal appearance

Adrenals/Urinary Tract: Small cyst inferior pole LEFT kidney 10 x 9
mm diameter. Small scar inferior pole of RIGHT kidney. Otherwise
normal appearing adrenal glands, kidneys, ureters, and bladder.

Stomach/Bowel: Appendix surgically absent. Questionable wall
thickening of descending colon versus artifact from underdistention,
cannot exclude colitis. Few diverticula noted at distal descending
and sigmoid colon without segmental wall thickening or pericolic
inflammatory changes to suggest acute diverticulitis.

Vascular/Lymphatic: Vascular structures grossly patent. No
adenopathy.

Reproductive: Uterus surgically absent with nonvisualization of
ovaries

Other: No free air or free fluid.  No hernia.

Musculoskeletal: Osseous structures unremarkable.
IMPRESSION: Questionable wall thickening of the descending colon ; while this
could be related to incomplete distention, possibility of descending
colitis is raised

Distal colonic diverticulosis without evidence of diverticulitis.

Post cholecystectomy, hysterectomy, and appendectomy with
nonvisualization of ovaries.

## 2017-08-07 ENCOUNTER — Ambulatory Visit: Payer: 59 | Admitting: Internal Medicine

## 2017-08-14 ENCOUNTER — Encounter: Payer: Self-pay | Admitting: Family Medicine

## 2017-08-14 ENCOUNTER — Ambulatory Visit: Payer: 59 | Admitting: Family Medicine

## 2017-08-14 VITALS — BP 112/80 | HR 78 | Temp 97.6°F | Ht 59.5 in | Wt 144.5 lb

## 2017-08-14 DIAGNOSIS — J301 Allergic rhinitis due to pollen: Secondary | ICD-10-CM | POA: Diagnosis not present

## 2017-08-14 MED ORDER — FLUTICASONE PROPIONATE 50 MCG/ACT NA SUSP: 2.0000 | Freq: Every day | NASAL | 2 refills | Status: DC

## 2017-08-14 MED ORDER — LEVOCETIRIZINE DIHYDROCHLORIDE 5 MG PO TABS: 5.0000 mg | ORAL_TABLET | Freq: Every evening | ORAL | 2 refills | Status: DC

## 2017-08-14 MED ORDER — METHYLPREDNISOLONE ACETATE 80 MG/ML IJ SUSP
80.0000 mg | Freq: Once | INTRAMUSCULAR | Status: AC
  Administered 2017-08-14: 80 mg via INTRAMUSCULAR

## 2017-08-14 NOTE — Patient Instructions (Signed)
Claritin (loratadine), Allegra (fexofenadine), Zyrtec (cetirizine); these are listed in order from weakest to strongest. Generic, and therefore cheaper, options are in the parentheses.   Flonase (fluticasone); nasal spray that is over the counter. 2 sprays each nostril, once daily. Aim towards the same side eye when you spray.  There are available OTC, and the generic versions, which may be cheaper, are in parentheses. Show this to a pharmacist if you have trouble finding any of these items.  Compare pricing to what we called in and get whatever is cheaper.  If you start having fevers, pus draining from nose or shortness of breath, seek immediate care.  Let us know if you need anything.

## 2017-08-14 NOTE — Addendum Note (Signed)
Addended by: Scharlene GlossEWING, Lakitha Gordy B on: 08/14/2017 02:44 PM   Modules accepted: Orders

## 2017-08-14 NOTE — Progress Notes (Signed)
Pre visit review using our clinic review tool, if applicable. No additional management support is needed unless otherwise documented below in the visit note. 

## 2017-08-14 NOTE — Progress Notes (Signed)
Chief Complaint  Patient presents with  . Sinusitis    Alicia Wallace here for URI complaints.  Duration: 2 days  Associated symptoms: sinus pain, rhinorrhea, itchy watery eyes, ear fullness and cough Denies: sinus congestion, ear pain, ear drainage, sore throat, wheezing, shortness of breath, myalgia and fevers Treatment to date: Doxy, Mucinex Sick contacts: No  ROS:  Const: Denies fevers HEENT: As noted in HPI Lungs: No SOB  Past Medical History:  Diagnosis Date  . Allergic rhinitis   . Asthma   . Hypertension   . Hypothyroid    Family History  Problem Relation Age of Onset  . Kidney disease Mother   . Heart disease Father   . Cancer Father        bladder  . Asthma Father   . Allergies Father     BP 112/80 (BP Location: Left Arm, Patient Position: Sitting, Cuff Size: Normal)   Pulse 78   Temp 97.6 F (36.4 C) (Oral)   Ht 4' 11.5" (1.511 m)   Wt 144 lb 8 oz (65.5 kg)   SpO2 95%   BMI 28.70 kg/m  General: Awake, alert, appears stated age HEENT: AT, [place], ears patent b/l and TM's neg, no sinus ttp, nares patent w/o discharge, pharynx pink and without exudates, MMM Neck: No masses or asymmetry Heart: RRR Lungs: CTAB, no accessory muscle use Psych: Age appropriate judgment and insight, normal mood and affect  Seasonal allergic rhinitis due to pollen - Plan: fluticasone (FLONASE) 50 MCG/ACT nasal spray, levocetirizine (XYZAL) 5 MG tablet  Orders as above. Steroid shot today. Continue to push fluids, practice good hand hygiene, cover mouth when coughing. F/u prn. If starting to experience fevers, shaking, or shortness of breath, seek immediate care. Pt voiced understanding and agreement to the plan.  Jilda Rocheicholas Paul [place], DO 08/14/17 2:38 PM

## 2017-08-27 DIAGNOSIS — Z01419 Encounter for gynecological examination (general) (routine) without abnormal findings: Secondary | ICD-10-CM | POA: Diagnosis not present

## 2017-09-13 NOTE — Progress Notes (Addendum)
[facility] [address] [place], [place] [address] [phone]  Date:  09/16/2017   Name:  Alicia Wallace   DOB:  [birth year]   MRN:  [id number]  PCP:  Pearline Cables, MD    Chief Complaint: Follow-up (Pt here for 6 month f/u visit. )   History of Present Illness:  Alicia Wallace is a 72 y.o. very pleasant female patient who presents with the following:  Periodic follow-up visit today History of HTN, hypothyroidism, hyperlipidemia, osteopenia  Lab Results  Component Value Date   TSH 2.93 09/16/2017   Last visit with myself in November of 2018: She is still working 40+ hours a week, has to watch her typing volume due to CTS and she does wear a brace while typing and at night sometimes .  This keeps her sx under control Her BP tends to be a bit high here at clinic-  BP at [facility] 117/78 last week She does check her BP at home- she tends to have 120/80 at home  She does use ambien for sleep as needed- she takes it most days  She also was in and saw Dr. Lacretia Nicks last month with allergic rhinitis- she notes that her allergies are better, but this year has been worse than normal  Needs labs today- she had a bit of cereal to eat this am  Last dexa: 12/17 Insomnia- she takes 5 mg of ambien every night.  She is sleeping well with this routine and feels well rested in the am.  She has followed this routine for about 2 years and feels that she is doing well.   She is still working "even harder" than she did in the past.  Does not wish to retire as she feels like she benefits from staying busy  Her CTS is a bit better, she uses a slower typing pace that does help   She notes that she never had chicken pox per her knowledge- ? Does she need shingrix. Will check a varicella titer for her today    NCC[facility]: getting her ambien through Dr., Madaline Guthrie  BP Readings from Last 3 Encounters:  09/16/17 124/84  08/14/17 112/80  03/19/17 (!)  146/85    Patient Active Problem List   Diagnosis Date Noted  . Other and unspecified hyperlipidemia 08/22/2013  . HTN (hypertension) 04/06/2012  . Osteopenia 04/06/2012  . Seasonal and perennial allergic rhinitis 07/15/2010  . HYPOTHYROIDISM 05/30/2008  . Asthma, mild intermittent 07/14/2007    Past Medical History:  Diagnosis Date  . Allergic rhinitis   . Asthma   . Hypertension   . Hypothyroid     Past Surgical History:  Procedure Laterality Date  . APPENDECTOMY    . CHOLECYSTECTOMY    . TONSILLECTOMY    . TOTAL ABDOMINAL HYSTERECTOMY W/ BILATERAL SALPINGOOPHORECTOMY      Social History   Tobacco Use  . Smoking status: Never Smoker  Substance Use Topics  . Alcohol use: No    Alcohol/week: 0.0 oz  . Drug use: No    Family History  Problem Relation Age of Onset  . Kidney disease Mother   . Heart disease Father   . Cancer Father        bladder  . Asthma Father   . Allergies Father     Allergies  Allergen Reactions  . Influenza Vac Split Quad Other (See Comments)    Size of grapefruit red,  swollen area at injection site  Leg cramps  Vaccine given was Fluarix Quadrivalent 2014/2015 formula by [employer]  . Penicillins Hives    Has patient had a PCN reaction causing immediate rash, facial/tongue/throat swelling, SOB or lightheadedness with hypotension: yes Has patient had a PCN reaction causing severe rash involving mucus membranes or skin necrosis: no Has patient had a PCN reaction that required hospitalization: no Has patient had a PCN reaction occurring within the last 10 years: no If all of the above answers are "NO", then may proceed with Cephalosporin use.   . Sulfonamide Derivatives Hives    Medication list has been reviewed and updated.  Current Outpatient Medications on File Prior to Visit  Medication Sig Dispense Refill  . Calcium Citrate-Vitamin D (CITRUS CALCIUM 1500 + D PO) Take 1 tablet by mouth daily.     . Cholecalciferol (VITAMIN D3) 2000  UNITS TABS Take 2,000 Units by mouth daily.     Marland Kitchen estradiol (CLIMARA - DOSED IN MG/24 HR) 0.025 mg/24hr patch APPLY 1 PATCH ONCE WEEKLY  2  . fluticasone (FLONASE) 50 MCG/ACT nasal spray Place 2 sprays into both nostrils daily. 16 g 2  . levothyroxine (SYNTHROID, LEVOTHROID) 88 MCG tablet Take 1 tablet (88 mcg total) by mouth daily. 90 tablet 3  . nortriptyline (PAMELOR) 10 MG capsule Take 2 at bedtime as needed 180 capsule 3  . zolpidem (AMBIEN) 10 MG tablet Take 1/2 tablet at bedtime as needed for sleep. May take 1 tablet if necessary 30 tablet 5   No current facility-administered medications on file prior to visit.     Review of Systems:  As per HPI- otherwise negative.   Physical Examination: Vitals:   09/16/17 0824  BP: 124/84  Pulse: 86  Temp: (!) 97.4 F (36.3 C)  SpO2: 99%   Vitals:   09/16/17 0824  Weight: 145 lb (65.8 kg)  Height: 4' 11.5" (1.511 m)   Body mass index is 28.8 kg/m. Ideal Body Weight: Weight in (lb) to have BMI = 25: 125.6  GEN: WDWN, NAD, Non-toxic, A & O x 3, overweight, looks well  HEENT: Atraumatic, Normocephalic. Neck supple. No masses, No LAD.  Bilateral TM wnl, oropharynx normal.  PEERL,EOMI.   Ears and Nose: No external deformity. CV: RRR, No M/G/R. No JVD. No thrill. No extra heart sounds. PULM: CTA B, no wheezes, crackles, rhonchi. No retractions. No resp. distress. No accessory muscle use. ABD: S, NT, ND EXTR: No c/c/e NEURO Normal gait.  PSYCH: Normally interactive. Conversant. Not depressed or anxious appearing.  Calm demeanor.    Assessment and Plan: Hypothyroidism due to acquired atrophy of thyroid - Plan: TSH  Essential hypertension - Plan: CBC, losartan (COZAAR) 100 MG tablet, metoprolol tartrate (LOPRESSOR) 50 MG tablet  Hyperlipidemia, unspecified hyperlipidemia type - Plan: Lipid panel  Elevated glucose - Plan: Comprehensive metabolic panel, Hemoglobin A1c  Seasonal allergic rhinitis due to pollen - Plan: levocetirizine  (XYZAL) 5 MG tablet  Insomnia, unspecified type  Exposure to varicella - Plan: Varicella zoster antibody, IgG  Follow-up visit today Labs pending as above BP under good control, did refills for her today A1c pending  Check varicella titer for her  Signed Abbe Amsterdam, MD  Received her labs- however varicella titer is still pending and pt will need a letter.  Await titer  Received titer on 5/9- pt has mychart, message to pt   Blood count is normal Metabolic profile normal  Hemoglobin A1c is in the pre-diabetes range- no change from  last year Cholesterol is overall good Thyroid is normal- continue current dosage of levothyroxine Your varicella antibody is positive- it looks like you did have chicken pox after all. This means that you could get shingles at some point, and you can be immunized against shingles at your convenience.  Take care! Results for orders placed or performed in visit on 09/16/17  CBC  Result Value Ref Range   WBC 7.0 4.0 - 10.5 K/uL   RBC 3.97 3.87 - 5.11 Mil/uL   Platelets 307.0 150.0 - 400.0 K/uL   Hemoglobin 12.1 12.0 - 15.0 g/dL   HCT 16.1 09.6 - 04.5 %   MCV 92.1 78.0 - 100.0 fl   MCHC 33.0 30.0 - 36.0 g/dL   RDW 40.9 81.1 - 91.4 %  Comprehensive metabolic panel  Result Value Ref Range   Sodium 137 135 - 145 mEq/L   Potassium 3.9 3.5 - 5.1 mEq/L   Chloride 103 96 - 112 mEq/L   CO2 29 19 - 32 mEq/L   Glucose, Bld 76 70 - 99 mg/dL   BUN 16 6 - 23 mg/dL   Creatinine, Ser 7.82 0.40 - 1.20 mg/dL   Total Bilirubin 0.3 0.2 - 1.2 mg/dL   Alkaline Phosphatase 62 39 - 117 U/L   AST 22 0 - 37 U/L   ALT 19 0 - 35 U/L   Total Protein 6.8 6.0 - 8.3 g/dL   Albumin 3.9 3.5 - 5.2 g/dL   Calcium 9.3 8.4 - 95.6 mg/dL   GFR 213.08 >65.78 mL/min  Hemoglobin A1c  Result Value Ref Range   Hgb A1c MFr Bld 6.1 4.6 - 6.5 %  Lipid panel  Result Value Ref Range   Cholesterol 198 0 - 200 mg/dL   Triglycerides 46.9 0.0 - 149.0 mg/dL   HDL 62.95 >28.41 mg/dL    VLDL 32.4 0.0 - 40.1 mg/dL   LDL Cholesterol 027 (H) 0 - 99 mg/dL   Total CHOL/HDL Ratio 3    NonHDL 139.45   TSH  Result Value Ref Range   TSH 2.93 0.35 - 4.50 uIU/mL  Varicella zoster antibody, IgG  Result Value Ref Range   Varicella IgG 1,541.00 index

## 2017-09-16 ENCOUNTER — Encounter: Payer: Self-pay | Admitting: Family Medicine

## 2017-09-16 ENCOUNTER — Ambulatory Visit (INDEPENDENT_AMBULATORY_CARE_PROVIDER_SITE_OTHER): Payer: 59 | Admitting: Family Medicine

## 2017-09-16 VITALS — BP 124/84 | HR 86 | Temp 97.4°F | Ht 59.5 in | Wt 145.0 lb

## 2017-09-16 DIAGNOSIS — R7309 Other abnormal glucose: Secondary | ICD-10-CM | POA: Diagnosis not present

## 2017-09-16 DIAGNOSIS — Z20820 Contact with and (suspected) exposure to varicella: Secondary | ICD-10-CM

## 2017-09-16 DIAGNOSIS — I10 Essential (primary) hypertension: Secondary | ICD-10-CM | POA: Diagnosis not present

## 2017-09-16 DIAGNOSIS — J301 Allergic rhinitis due to pollen: Secondary | ICD-10-CM

## 2017-09-16 DIAGNOSIS — E785 Hyperlipidemia, unspecified: Secondary | ICD-10-CM | POA: Diagnosis not present

## 2017-09-16 DIAGNOSIS — E034 Atrophy of thyroid (acquired): Secondary | ICD-10-CM | POA: Diagnosis not present

## 2017-09-16 DIAGNOSIS — G4700 Insomnia, unspecified: Secondary | ICD-10-CM | POA: Diagnosis not present

## 2017-09-16 LAB — LIPID PANEL
Cholesterol: 198 mg/dL (ref 0–200)
HDL: 58.5 mg/dL (ref >39.00)
LDL Cholesterol: 123 mg/dL — ABNORMAL HIGH (ref 0–99)
NONHDL: 139.45
Total CHOL/HDL Ratio: 3
Triglycerides: 83 mg/dL (ref 0.0–149.0)
VLDL: 16.6 mg/dL (ref 0.0–40.0)

## 2017-09-16 LAB — COMPREHENSIVE METABOLIC PANEL
ALK PHOS: 62 U/L (ref 39–117)
ALT: 19 U/L (ref 0–35)
AST: 22 U/L (ref 0–37)
Albumin: 3.9 g/dL (ref 3.5–5.2)
BILIRUBIN TOTAL: 0.3 mg/dL (ref 0.2–1.2)
BUN: 16 mg/dL (ref 6–23)
CHLORIDE: 103 meq/L (ref 96–112)
CO2: 29 mEq/L (ref 19–32)
CREATININE: 0.6 mg/dL (ref 0.40–1.20)
Calcium: 9.3 mg/dL (ref 8.4–10.5)
GFR: 105.01 mL/min (ref >60.00)
Glucose, Bld: 76 mg/dL (ref 70–99)
Potassium: 3.9 mEq/L (ref 3.5–5.1)
SODIUM: 137 meq/L (ref 135–145)
TOTAL PROTEIN: 6.8 g/dL (ref 6.0–8.3)

## 2017-09-16 LAB — TSH: TSH: 2.93 u[IU]/mL (ref 0.35–4.50)

## 2017-09-16 LAB — CBC
HCT: 36.6 % (ref 36.0–46.0)
Hemoglobin: 12.1 g/dL (ref 12.0–15.0)
MCHC: 33 g/dL (ref 30.0–36.0)
MCV: 92.1 fl (ref 78.0–100.0)
Platelets: 307 10*3/uL (ref 150.0–400.0)
RBC: 3.97 Mil/uL (ref 3.87–5.11)
RDW: 13.8 % (ref 11.5–15.5)
WBC: 7 10*3/uL (ref 4.0–10.5)

## 2017-09-16 LAB — HEMOGLOBIN A1C: HEMOGLOBIN A1C: 6.1 % (ref 4.6–6.5)

## 2017-09-16 MED ORDER — LOSARTAN POTASSIUM 100 MG PO TABS: ORAL_TABLET | ORAL | 3 refills | Status: DC

## 2017-09-16 MED ORDER — METOPROLOL TARTRATE 50 MG PO TABS: 50.0000 mg | ORAL_TABLET | Freq: Two times a day (BID) | ORAL | 3 refills | Status: DC

## 2017-09-16 MED ORDER — LEVOCETIRIZINE DIHYDROCHLORIDE 5 MG PO TABS: 5.0000 mg | ORAL_TABLET | Freq: Every evening | ORAL | 3 refills | Status: DC

## 2017-09-16 NOTE — Patient Instructions (Signed)
Good to see you today- your BP looks great!  We will check your routine labs today and I will also look for immunity to chicken pox virus. If you are NOT immune you should have a chicken pox vaccine. If you are immune we should consider a shingles shot Take care and please plan to see me in 6 months

## 2017-09-17 ENCOUNTER — Telehealth: Payer: Self-pay | Admitting: Family Medicine

## 2017-09-17 ENCOUNTER — Encounter: Payer: Self-pay | Admitting: Family Medicine

## 2017-09-17 LAB — VARICELLA ZOSTER ANTIBODY, IGG: Varicella IgG: 1541 index

## 2017-09-17 NOTE — Telephone Encounter (Signed)
Copied from CRM [id number]. Topic: Quick Communication - See Telephone Encounter >> Sep 17, 2017  9:42 AM Eston Mould B wrote: CRM for notification. See Telephone encounter for: 09/17/17.  Pt is calling for lab results

## 2017-09-18 NOTE — Telephone Encounter (Signed)
Patient notified of results via mychart message.  

## 2017-09-21 NOTE — Telephone Encounter (Signed)
Results have been mailed to patient as requested.

## 2017-09-21 NOTE — Telephone Encounter (Signed)
Copied from CRM [id number]. Topic: General - Other >> Sep 18, 2017 10:26 AM Marylen Ponto wrote: Reason for CRM: Pt requesting that her lab results be mailed to her home address: [address]  [place], [place] [address]

## 2017-09-22 DIAGNOSIS — Z23 Encounter for immunization: Secondary | ICD-10-CM | POA: Diagnosis not present

## 2017-10-15 ENCOUNTER — Other Ambulatory Visit: Payer: Self-pay | Admitting: Family Medicine

## 2017-10-15 DIAGNOSIS — E034 Atrophy of thyroid (acquired): Secondary | ICD-10-CM

## 2017-11-22 DIAGNOSIS — Z23 Encounter for immunization: Secondary | ICD-10-CM | POA: Diagnosis not present

## 2018-01-21 DIAGNOSIS — Z23 Encounter for immunization: Secondary | ICD-10-CM | POA: Diagnosis not present

## 2018-03-14 NOTE — Progress Notes (Signed)
[facility] at [facility] [address] [place], [place] [address] [phone]  Date:  03/22/2018   Name:  Alicia Wallace   DOB:  1946/04/23   MRN:  [id number]  PCP:  Pearline Cables, MD    Chief Complaint: Hypertension (6 month follow up) and Hypothyroidism (refill on levothyroxine)   History of Present Illness:  Alicia Wallace is a 72 y.o. very pleasant female patient who presents with the following:  Periodic follow-up visit today Last seen here History of hyperlipidemia, HTN, hypothyroidism, asthma, insomnia From our last visit in May of this year: Insomnia- she takes 5 mg of ambien every night.  She is sleeping well with this routine and feels well rested in the am.  She has followed this routine for about 2 years and feels that she is doing well.   She is still working "even harder" than she did in the past.  Does not wish to retire as she feels like she benefits from staying busy  Her CTS is a bit better, she uses a slower typing pace that does help   She is still working full time, but is typing slower and her CTS is ok Her family is well   2nd shingrix is already done  She has neve had shingles outbreak  Sleep is ok She is using ambien 5 mg each night Asthma- she has a harder time when the weather is colder  She uses her albuterol on very rare occasion She has no other concerns today  BP Readings from Last 3 Encounters:  03/22/18 140/88  09/16/17 124/84  08/14/17 112/80    Lab Results  Component Value Date   TSH 2.93 09/16/2017     Patient Active Problem List   Diagnosis Date Noted  . Other and unspecified hyperlipidemia 08/22/2013  . HTN (hypertension) 04/06/2012  . Osteopenia 04/06/2012  . Seasonal and perennial allergic rhinitis 07/15/2010  . HYPOTHYROIDISM 05/30/2008  . Asthma, mild intermittent 07/14/2007    Past Medical History:  Diagnosis Date  . Allergic rhinitis   . Asthma   .  Hypertension   . Hypothyroid     Past Surgical History:  Procedure Laterality Date  . APPENDECTOMY    . CHOLECYSTECTOMY    . TONSILLECTOMY    . TOTAL ABDOMINAL HYSTERECTOMY W/ BILATERAL SALPINGOOPHORECTOMY      Social History   Tobacco Use  . Smoking status: Never Smoker  Substance Use Topics  . Alcohol use: No    Alcohol/week: 0.0 standard drinks  . Drug use: No    Family History  Problem Relation Age of Onset  . Kidney disease Mother   . Heart disease Father   . Cancer Father        bladder  . Asthma Father   . Allergies Father     Allergies  Allergen Reactions  . Influenza Vac Split Quad Other (See Comments)    Size of grapefruit red, swollen area at injection site  Leg cramps  Vaccine given was Fluarix Quadrivalent 2014/2015 formula by [employer]  . Penicillins Hives    Has patient had a PCN reaction causing immediate rash, facial/tongue/throat swelling, SOB or lightheadedness with hypotension: yes Has patient had a PCN reaction causing severe rash involving mucus membranes or skin necrosis: no Has patient had a PCN reaction that required hospitalization: no Has patient had a PCN reaction occurring within the last 10 years: no If all of the above  answers are "NO", then may proceed with Cephalosporin use.   . Sulfonamide Derivatives Hives    Medication list has been reviewed and updated.  Current Outpatient Medications on File Prior to Visit  Medication Sig Dispense Refill  . Calcium Citrate-Vitamin D (CITRUS CALCIUM 1500 + D PO) Take 1 tablet by mouth daily.     . Cholecalciferol (VITAMIN D3) 2000 UNITS TABS Take 2,000 Units by mouth daily.     Marland Kitchen estradiol (CLIMARA - DOSED IN MG/24 HR) 0.025 mg/24hr patch APPLY 1 PATCH ONCE WEEKLY  2  . levothyroxine (SYNTHROID, LEVOTHROID) 88 MCG tablet TAKE 1 TABLET (88 MCG TOTAL) BY MOUTH DAILY. 90 tablet 1  . losartan (COZAAR) 100 MG tablet TAKE 1 TABLET (100 MG TOTAL) BY MOUTH DAILY. 90 tablet 3  . metoprolol tartrate  (LOPRESSOR) 50 MG tablet Take 1 tablet (50 mg total) by mouth 2 (two) times daily. 180 tablet 3  . nortriptyline (PAMELOR) 10 MG capsule Take 2 at bedtime as needed 180 capsule 3  . zolpidem (AMBIEN) 10 MG tablet Take 1/2 tablet at bedtime as needed for sleep. May take 1 tablet if necessary 30 tablet 5  . levocetirizine (XYZAL) 5 MG tablet Take 1 tablet (5 mg total) by mouth every evening. (Patient not taking: Reported on 03/22/2018) 90 tablet 3   No current facility-administered medications on file prior to visit.     Review of Systems:  As per HPI- otherwise negative.   Physical Examination: Vitals:   03/22/18 0821  BP: 140/88  Pulse: 76  Resp: 16  Temp: 98.4 F (36.9 C)  SpO2: 97%   Vitals:   03/22/18 0821  Weight: 150 lb (68 kg)  Height: 4' 11.5" (1.511 m)   Body mass index is 29.79 kg/m. Ideal Body Weight: Weight in (lb) to have BMI = 25: 125.6  GEN: WDWN, NAD, Non-toxic, A & O x 3 HEENT: Atraumatic, Normocephalic. Neck supple. No masses, No LAD.  Bilateral TM wnl, oropharynx normal.  PEERL,EOMI.   Ears and Nose: No external deformity. CV: RRR, No M/G/R. No JVD. No thrill. No extra heart sounds. PULM: CTA B, no wheezes, crackles, rhonchi. No retractions. No resp. distress. No accessory muscle use. EXTR: No c/c/e NEURO Normal gait.  PSYCH: Normally interactive. Conversant. Not depressed or anxious appearing.  Calm demeanor.    Assessment and Plan: Hypothyroidism due to acquired atrophy of thyroid - Plan: levothyroxine (SYNTHROID, LEVOTHROID) 88 MCG tablet  Essential hypertension  Seasonal allergic rhinitis due to pollen  Insomnia, unspecified type  Allergies and asthma are quiet at this time Refilled thyroid med Pt wishes to defer labs until her springtime CPE which is fine. Her thyroid has been stable BP is under good control   Needs a refill of her thyroid med today  Signed Abbe Amsterdam, MD

## 2018-03-22 ENCOUNTER — Ambulatory Visit: Payer: 59 | Admitting: Family Medicine

## 2018-03-22 ENCOUNTER — Encounter: Payer: Self-pay | Admitting: Family Medicine

## 2018-03-22 VITALS — BP 140/88 | HR 76 | Temp 98.4°F | Resp 16 | Ht 59.5 in | Wt 150.0 lb

## 2018-03-22 DIAGNOSIS — G4700 Insomnia, unspecified: Secondary | ICD-10-CM

## 2018-03-22 DIAGNOSIS — J301 Allergic rhinitis due to pollen: Secondary | ICD-10-CM

## 2018-03-22 DIAGNOSIS — I10 Essential (primary) hypertension: Secondary | ICD-10-CM | POA: Diagnosis not present

## 2018-03-22 DIAGNOSIS — E034 Atrophy of thyroid (acquired): Secondary | ICD-10-CM

## 2018-03-22 MED ORDER — LEVOTHYROXINE SODIUM 88 MCG PO TABS: 88.0000 ug | ORAL_TABLET | Freq: Every day | ORAL | 3 refills | Status: DC

## 2018-03-22 NOTE — Patient Instructions (Signed)
Good to see you as always!  Let me know when you need the ambien refilled Please plan to see me in about 6 months for a physical and fasting labs

## 2018-04-15 ENCOUNTER — Encounter: Payer: Self-pay | Admitting: Family Medicine

## 2018-04-15 DIAGNOSIS — Z90710 Acquired absence of both cervix and uterus: Secondary | ICD-10-CM | POA: Diagnosis not present

## 2018-04-15 DIAGNOSIS — M8589 Other specified disorders of bone density and structure, multiple sites: Secondary | ICD-10-CM | POA: Diagnosis not present

## 2018-04-28 ENCOUNTER — Telehealth: Payer: Self-pay | Admitting: *Deleted

## 2018-04-28 NOTE — Telephone Encounter (Signed)
[facility]; forwarded to provider/SLS 12/18

## 2018-06-19 DIAGNOSIS — Z1231 Encounter for screening mammogram for malignant neoplasm of breast: Secondary | ICD-10-CM | POA: Diagnosis not present

## 2018-06-19 LAB — HM MAMMOGRAPHY

## 2018-06-22 ENCOUNTER — Telehealth: Payer: Self-pay | Admitting: *Deleted

## 2018-06-22 NOTE — Telephone Encounter (Signed)
Received Mammogram results from [facility]; forwarded to provider/SLS 02/11

## 2018-06-23 ENCOUNTER — Encounter: Payer: Self-pay | Admitting: Family Medicine

## 2018-06-24 ENCOUNTER — Encounter: Payer: Self-pay | Admitting: Family Medicine

## 2018-07-31 DIAGNOSIS — Z23 Encounter for immunization: Secondary | ICD-10-CM | POA: Diagnosis not present

## 2018-08-03 ENCOUNTER — Encounter: Payer: Self-pay | Admitting: Family Medicine

## 2018-08-04 ENCOUNTER — Encounter: Payer: Self-pay | Admitting: Family Medicine

## 2018-08-04 ENCOUNTER — Other Ambulatory Visit: Payer: Self-pay

## 2018-08-04 ENCOUNTER — Ambulatory Visit (INDEPENDENT_AMBULATORY_CARE_PROVIDER_SITE_OTHER): Payer: 59 | Admitting: Family Medicine

## 2018-08-04 ENCOUNTER — Telehealth: Payer: Self-pay

## 2018-08-04 DIAGNOSIS — E785 Hyperlipidemia, unspecified: Secondary | ICD-10-CM | POA: Diagnosis not present

## 2018-08-04 DIAGNOSIS — R7309 Other abnormal glucose: Secondary | ICD-10-CM

## 2018-08-04 DIAGNOSIS — I10 Essential (primary) hypertension: Secondary | ICD-10-CM | POA: Diagnosis not present

## 2018-08-04 DIAGNOSIS — G4700 Insomnia, unspecified: Secondary | ICD-10-CM

## 2018-08-04 DIAGNOSIS — E034 Atrophy of thyroid (acquired): Secondary | ICD-10-CM

## 2018-08-04 DIAGNOSIS — Z1211 Encounter for screening for malignant neoplasm of colon: Secondary | ICD-10-CM

## 2018-08-04 MED ORDER — METOPROLOL TARTRATE 50 MG PO TABS: 50.0000 mg | ORAL_TABLET | Freq: Two times a day (BID) | ORAL | 3 refills | Status: DC

## 2018-08-04 MED ORDER — LOSARTAN POTASSIUM 100 MG PO TABS: ORAL_TABLET | ORAL | 3 refills | Status: DC

## 2018-08-04 NOTE — Patient Instructions (Signed)
It was very nice to talk with you today. As we discussed, I placed lab orders for you in the computer.  When you feel it is safe to come out, please call the office and schedule a lab visit.  This is not an emergency, no need to come out until things are safe  I refilled your blood pressure medications today I also ordered the Cologuard test to screen for colon cancer.  They will send a kit to your home, you can collect a stool sample and send it back for testing  I went to refill your Ambien, but it looks like Dr. Albertine Patricia is actually doing this.  She has been refilling this for some time.  Please ask her office to refill this medication when you need it.  With control sentences, is important to always get them from the same provider  Stay safe, let us plan to visit in the office in 3 or 4 months

## 2018-08-04 NOTE — Progress Notes (Signed)
[facility] at [facility] [address] [place], [place] [address] [phone]  Date:  08/04/2018   Name:  Alicia Wallace   DOB:  1946-02-11   MRN:  [id number]  PCP:  Pearline Cables, MD    Chief Complaint: No chief complaint on file.   History of Present Illness:  Alicia Wallace is a 73 y.o. very pleasant female patient who presents with the following:  Telephone visit today due to COVID 30 outbreak Called pt at 9:24 am Pt identified by her name and DOB Pt is by herself History of HTN, hypothyroidism, osteopenia, hyperlipidemia, insomnia on ambien, mild asthma which is typically seasonal Last seen by myself in November She got laid off from her job last week. She has worked for 54 years and loves working.  She hopes that she will be able to get back to work once the crisis has passed She has not noted any wheezing recently She is using a humidifier She has not needed her albuterol or allergy meds in the last 6 months!  She is pleased   She does use nortrip Her BP at home have been 120/73  Colon cancer screening: she thinks this was about 5 years ago but we don't have this report  mammo is UTD Labs about one year ago- will order as a future for her  BP Readings from Last 3 Encounters:  03/22/18 140/88  09/16/17 124/84  08/14/17 112/80   Estrogen patch per her GYN  Need to refill her BP meds and ambien today Her arm and hand sx are better now that she is not using the computer so much.  She has plenty of   Patient Active Problem List   Diagnosis Date Noted  . Other and unspecified hyperlipidemia 08/22/2013  . HTN (hypertension) 04/06/2012  . Osteopenia 04/06/2012  . Seasonal and perennial allergic rhinitis 07/15/2010  . HYPOTHYROIDISM 05/30/2008  . Asthma, mild intermittent 07/14/2007    Past Medical History:  Diagnosis Date  . Allergic rhinitis   . Asthma   . Hypertension   . Hypothyroid     Past Surgical  History:  Procedure Laterality Date  . APPENDECTOMY    . CHOLECYSTECTOMY    . TONSILLECTOMY    . TOTAL ABDOMINAL HYSTERECTOMY W/ BILATERAL SALPINGOOPHORECTOMY      Social History   Tobacco Use  . Smoking status: Never Smoker  Substance Use Topics  . Alcohol use: No    Alcohol/week: 0.0 standard drinks  . Drug use: No    Family History  Problem Relation Age of Onset  . Kidney disease Mother   . Heart disease Father   . Cancer Father        bladder  . Asthma Father   . Allergies Father     Allergies  Allergen Reactions  . Influenza Vac Split Quad Other (See Comments)    Size of grapefruit red, swollen area at injection site  Leg cramps  Vaccine given was Fluarix Quadrivalent 2014/2015 formula by [employer]  . Penicillins Hives    Has patient had a PCN reaction causing immediate rash, facial/tongue/throat swelling, SOB or lightheadedness with hypotension: yes Has patient had a PCN reaction causing severe rash involving mucus membranes or skin necrosis: no Has patient had a PCN reaction that required hospitalization: no Has patient had a PCN reaction occurring within the last 10 years: no If all of the above answers are "NO", then may proceed  with Cephalosporin use.   . Sulfonamide Derivatives Hives    Medication list has been reviewed and updated.  Current Outpatient Medications on File Prior to Visit  Medication Sig Dispense Refill  . Calcium Citrate-Vitamin D (CITRUS CALCIUM 1500 + D PO) Take 1 tablet by mouth daily.     . Cholecalciferol (VITAMIN D3) 2000 UNITS TABS Take 2,000 Units by mouth daily.     Marland Kitchen estradiol (CLIMARA - DOSED IN MG/24 HR) 0.025 mg/24hr patch APPLY 1 PATCH ONCE WEEKLY  2  . levocetirizine (XYZAL) 5 MG tablet Take 1 tablet (5 mg total) by mouth every evening. (Patient not taking: Reported on 03/22/2018) 90 tablet 3  . levothyroxine (SYNTHROID, LEVOTHROID) 88 MCG tablet Take 1 tablet (88 mcg total) by mouth daily. 90 tablet 3  . losartan (COZAAR) 100  MG tablet TAKE 1 TABLET (100 MG TOTAL) BY MOUTH DAILY. 90 tablet 3  . metoprolol tartrate (LOPRESSOR) 50 MG tablet Take 1 tablet (50 mg total) by mouth 2 (two) times daily. 180 tablet 3  . nortriptyline (PAMELOR) 10 MG capsule Take 2 at bedtime as needed 180 capsule 3  . zolpidem (AMBIEN) 10 MG tablet Take 1/2 tablet at bedtime as needed for sleep. May take 1 tablet if necessary 30 tablet 5   No current facility-administered medications on file prior to visit.     Review of Systems:  No fever or chills No SOB   Physical Examination: There were no vitals filed for this visit. There were no vitals filed for this visit. There is no height or weight on file to calculate BMI. Ideal Body Weight:    Pt sounds well, no SOB or distress detected   Assessment and Plan: Hypothyroidism due to acquired atrophy of thyroid - Plan: TSH  Essential hypertension - Plan: CBC, Comprehensive metabolic panel, losartan (COZAAR) 100 MG tablet, metoprolol tartrate (LOPRESSOR) 50 MG tablet  Hyperlipidemia, unspecified hyperlipidemia type - Plan: Lipid panel  Elevated glucose - Plan: Comprehensive metabolic panel, Hemoglobin A1c  Insomnia, unspecified type  Screening for colon cancer  Refilled her blood pressure medications.  She reports that her blood pressure is fine at home Ordered future labs to monitor blood count, CMP, lipid, A1c Date of last colonoscopy is really uncertain.  She is also not sure where a colonoscopy was done.  We decided proceed with Cologuard to make sure she is up-to-date Due for thyroid check, placed future lab order  We discussed Ambien but it looks like Dr. Madaline Guthrie is rx this for her -we will remind patient to ask her when refill is due Spoke with pt for 9 minutes, timed on phone  Signed Abbe Amsterdam, MD

## 2018-08-04 NOTE — Telephone Encounter (Signed)
Copied from CRM [id number]. Topic: General - Inquiry >> Aug 03, 2018 12:06 PM Terisa Starr wrote: Reason for CRM: patient called back about the appt for tomorrow. Tried to reach office no answer. Patient would like the nurse to call her back about that. She said she will keep phone by her.

## 2018-08-04 NOTE — Telephone Encounter (Signed)
Patient having telephone visit rather than in person visit.

## 2018-09-21 DIAGNOSIS — Z1211 Encounter for screening for malignant neoplasm of colon: Secondary | ICD-10-CM | POA: Diagnosis not present

## 2018-09-21 DIAGNOSIS — Z1212 Encounter for screening for malignant neoplasm of rectum: Secondary | ICD-10-CM | POA: Diagnosis not present

## 2018-09-21 LAB — COLOGUARD

## 2018-09-22 ENCOUNTER — Ambulatory Visit: Payer: 59 | Admitting: Family Medicine

## 2018-10-07 ENCOUNTER — Telehealth: Payer: Self-pay

## 2018-10-07 ENCOUNTER — Encounter: Payer: Self-pay | Admitting: Family Medicine

## 2018-10-07 NOTE — Telephone Encounter (Signed)
Looks like we just placed the order on 5/7. I dont see anything scanned in her chart as far as results. Have you gotten anything?

## 2018-10-07 NOTE — Telephone Encounter (Signed)
I have not done result back yet, updated patient

## 2018-10-07 NOTE — Telephone Encounter (Signed)
Copied from CRM [id number]. Topic: General - Other >> Oct 06, 2018  1:17 PM Alicia Wallace wrote: Reason for CRM:  pt called in and would like to know see if her cologuard results have come back in?  Please advise

## 2018-10-07 NOTE — Telephone Encounter (Signed)
Advised Pt of message below. Pt states she sent in cologuard on May 1st.

## 2018-10-08 NOTE — Telephone Encounter (Signed)
Sent on May 1st per patient.

## 2018-10-09 ENCOUNTER — Encounter: Payer: Self-pay | Admitting: Family Medicine

## 2018-10-11 DIAGNOSIS — Z01419 Encounter for gynecological examination (general) (routine) without abnormal findings: Secondary | ICD-10-CM | POA: Diagnosis not present

## 2018-10-11 DIAGNOSIS — Z0100 Encounter for examination of eyes and vision without abnormal findings: Secondary | ICD-10-CM | POA: Diagnosis not present

## 2018-10-21 DIAGNOSIS — R69 Illness, unspecified: Secondary | ICD-10-CM | POA: Diagnosis not present

## 2018-11-29 DIAGNOSIS — R69 Illness, unspecified: Secondary | ICD-10-CM | POA: Diagnosis not present

## 2018-12-06 ENCOUNTER — Other Ambulatory Visit: Payer: Self-pay | Admitting: Family Medicine

## 2018-12-06 DIAGNOSIS — J301 Allergic rhinitis due to pollen: Secondary | ICD-10-CM

## 2019-01-11 DIAGNOSIS — R69 Illness, unspecified: Secondary | ICD-10-CM | POA: Diagnosis not present

## 2019-01-24 ENCOUNTER — Other Ambulatory Visit: Payer: Self-pay | Admitting: Family Medicine

## 2019-01-24 DIAGNOSIS — E034 Atrophy of thyroid (acquired): Secondary | ICD-10-CM

## 2019-02-01 DIAGNOSIS — R69 Illness, unspecified: Secondary | ICD-10-CM | POA: Diagnosis not present

## 2019-04-21 ENCOUNTER — Ambulatory Visit (INDEPENDENT_AMBULATORY_CARE_PROVIDER_SITE_OTHER): Payer: Medicare HMO | Admitting: Family Medicine

## 2019-04-21 ENCOUNTER — Encounter: Payer: Self-pay | Admitting: Family Medicine

## 2019-04-21 ENCOUNTER — Other Ambulatory Visit: Payer: Self-pay

## 2019-04-21 DIAGNOSIS — R0989 Other specified symptoms and signs involving the circulatory and respiratory systems: Secondary | ICD-10-CM

## 2019-04-21 MED ORDER — DOXYCYCLINE HYCLATE 100 MG PO CAPS: 100.0000 mg | ORAL_CAPSULE | Freq: Two times a day (BID) | ORAL | 0 refills | Status: DC

## 2019-04-21 NOTE — Progress Notes (Signed)
[facility] at [facility] [address], [address] [place], [place] [address] [phone]  Date:  04/21/2019   Name:  Alicia Wallace   DOB:  [birth year]   MRN:  [id number]  PCP:  Pearline Cables, MD    Chief Complaint: No chief complaint on file.   History of Present Illness:  Alicia Wallace is a 73 y.o. 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 Other and unspecified hyperlipidemia 08/22/2013  . HTN (hypertension) 04/06/2012  . Osteopenia 04/06/2012  . Seasonal and perennial allergic rhinitis 07/15/2010  . HYPOTHYROIDISM 05/30/2008  . Asthma, mild intermittent 07/14/2007    Past Medical History:  Diagnosis Date  . Allergic rhinitis   . Asthma   . Hypertension   . Hypothyroid     Past Surgical History:  Procedure Laterality Date  . APPENDECTOMY    . CHOLECYSTECTOMY    . TONSILLECTOMY    . TOTAL ABDOMINAL HYSTERECTOMY W/ BILATERAL SALPINGOOPHORECTOMY      Social History   Tobacco Use  . Smoking status: Never Smoker  Substance Use Topics  . Alcohol use: No    Alcohol/week: 0.0 standard drinks  . Drug use: No    Family History  Problem Relation Age of Onset  .  Kidney disease Mother   . Heart disease Father   . Cancer Father        bladder  . Asthma Father   . Allergies Father     Allergies  Allergen Reactions  . Influenza Vac Split Quad Other (See Comments)    Size of grapefruit red, swollen area at injection site  Leg cramps  Vaccine given was Fluarix Quadrivalent 2014/2015 formula by [employer]  . Penicillins Hives    Has patient had a PCN reaction causing immediate rash, facial/tongue/throat swelling, SOB or lightheadedness with hypotension: yes Has patient had a PCN reaction causing severe rash involving mucus membranes or skin necrosis: no Has patient had a PCN reaction that required hospitalization: no Has patient had a PCN reaction occurring within the last 10 years: no If all of the above answers are "NO", then may proceed with Cephalosporin use.   . Sulfonamide Derivatives Hives    Medication list has been reviewed and updated.  Current Outpatient Medications on File Prior to Visit  Medication Sig Dispense Refill  . Calcium Citrate-Vitamin D (CITRUS CALCIUM 1500 + D PO) Take 1 tablet by mouth daily.     . Cholecalciferol (VITAMIN D3) 2000 UNITS TABS Take 2,000 Units by mouth daily.     Marland Kitchen estradiol (CLIMARA - DOSED IN  MG/24 HR) 0.025 mg/24hr patch APPLY 1 PATCH ONCE WEEKLY  2  . levocetirizine (XYZAL) 5 MG tablet TAKE 1 TABLET BY MOUTH EVERY DAY IN THE EVENING 90 tablet 2  . levothyroxine (SYNTHROID) 88 MCG tablet TAKE 1 TABLET BY MOUTH EVERY DAY 90 tablet 3  . losartan (COZAAR) 100 MG tablet TAKE 1 TABLET (100 MG TOTAL) BY MOUTH DAILY. 90 tablet 3  . metoprolol tartrate (LOPRESSOR) 50 MG tablet Take 1 tablet (50 mg total) by mouth 2 (two) times daily. 180 tablet 3  . nortriptyline (PAMELOR) 10 MG capsule Take 2 at bedtime as needed 180 capsule 3  . zolpidem (AMBIEN) 10 MG tablet Take 1/2 tablet at bedtime as needed for sleep. May take 1 tablet if necessary 30 tablet 5   No current facility-administered medications on file prior to  visit.    Review of Systems:  As per HPI- otherwise negative.  No fever or chills Physical Examination: There were no vitals filed for this visit. There were no vitals filed for this visit. There is no height or weight on file to calculate BMI. Ideal Body Weight:    Spoke with pt on the phone today - she sounds well, no cough, wheezing or distres is noted  Her temp and BP are normal at home - she is checking daily  Assessment and Plan: Chest congestion - Plan: doxycycline (VIBRAMYCIN) 100 MG capsule, Novel Coronavirus, NAA ([facility])  Phone visit today for chest congestion, cough rx for doxycycline Advised pt to be tested for covid 19 and to self- isolate until her results come in Explained testing procedure- can be tested at any facility that is convenient for her She does not need a work note No distress Asked her to let me know if getting worse or if she needs anything further Spoke to pt for 8 minutes today   Signed Lamar Blinks, MD

## 2019-04-22 ENCOUNTER — Other Ambulatory Visit: Payer: Self-pay

## 2019-04-22 DIAGNOSIS — Z20822 Contact with and (suspected) exposure to covid-19: Secondary | ICD-10-CM

## 2019-04-24 ENCOUNTER — Encounter: Payer: Self-pay | Admitting: Family Medicine

## 2019-04-24 LAB — NOVEL CORONAVIRUS, NAA: SARS-CoV-2, NAA: DETECTED — AB

## 2019-05-02 ENCOUNTER — Telehealth: Payer: Self-pay

## 2019-05-02 NOTE — Telephone Encounter (Signed)
Copied from Oto [id number]. Topic: General - Inquiry >> May 02, 2019  1:56 PM Virl Axe D wrote: Reason for CRM: Pt stated her employer is requiring her to be retested on 05/09/19 and again on 05/12/19 with negative results in order for her to return to work. She would like to know if Dr. Lorelei Pont can request/put in orders for her to have these tests done since the system will not allow her to schedule appts so closely. She will also need a letter from Dr. Lorelei Pont clearing her to go back to work once results are back .Please advise.

## 2019-05-03 ENCOUNTER — Encounter: Payer: Self-pay | Admitting: Family Medicine

## 2019-05-03 NOTE — Telephone Encounter (Signed)
Sent message to pt

## 2019-05-09 ENCOUNTER — Ambulatory Visit: Payer: Medicare HMO | Attending: Internal Medicine

## 2019-05-09 DIAGNOSIS — Z20828 Contact with and (suspected) exposure to other viral communicable diseases: Secondary | ICD-10-CM | POA: Diagnosis not present

## 2019-05-09 DIAGNOSIS — Z20822 Contact with and (suspected) exposure to covid-19: Secondary | ICD-10-CM

## 2019-05-11 LAB — NOVEL CORONAVIRUS, NAA: SARS-CoV-2, NAA: NOT DETECTED

## 2019-05-12 ENCOUNTER — Ambulatory Visit: Payer: Medicare HMO | Attending: Internal Medicine

## 2019-05-12 DIAGNOSIS — Z20828 Contact with and (suspected) exposure to other viral communicable diseases: Secondary | ICD-10-CM | POA: Diagnosis not present

## 2019-05-12 DIAGNOSIS — Z20822 Contact with and (suspected) exposure to covid-19: Secondary | ICD-10-CM

## 2019-05-14 LAB — NOVEL CORONAVIRUS, NAA: SARS-CoV-2, NAA: NOT DETECTED

## 2019-05-16 ENCOUNTER — Telehealth: Payer: Self-pay | Admitting: *Deleted

## 2019-05-16 NOTE — Telephone Encounter (Signed)
Copied from CRM [id number]. Topic: General - Other >> May 12, 2019  3:31 PM Wyonia Hough E wrote: Reason for CRM: Pt was tested again for covid and her last two results were negative. Pts employer needs her results and a letter clearing her to return to work/ Pt would like this faxed to her employer Fax# [phone] Attention: Fawn Kirk / please advise Pt also stated she can pick a copy of this as well on Monday morning if possible

## 2019-05-17 ENCOUNTER — Encounter: Payer: Self-pay | Admitting: Family Medicine

## 2019-05-17 NOTE — Telephone Encounter (Signed)
Wrote letter and sent to my assistant to fax, mychart message to pt   [facility] please fax letter

## 2019-07-20 DIAGNOSIS — I10 Essential (primary) hypertension: Secondary | ICD-10-CM | POA: Diagnosis not present

## 2019-07-20 DIAGNOSIS — H5210 Myopia, unspecified eye: Secondary | ICD-10-CM | POA: Diagnosis not present

## 2019-07-20 DIAGNOSIS — Z0100 Encounter for examination of eyes and vision without abnormal findings: Secondary | ICD-10-CM | POA: Diagnosis not present

## 2019-07-25 ENCOUNTER — Other Ambulatory Visit: Payer: Self-pay | Admitting: Family Medicine

## 2019-07-25 DIAGNOSIS — I10 Essential (primary) hypertension: Secondary | ICD-10-CM

## 2019-07-30 LAB — HM DIABETES EYE EXAM

## 2019-08-08 DIAGNOSIS — N951 Menopausal and female climacteric states: Secondary | ICD-10-CM | POA: Diagnosis not present

## 2019-08-09 ENCOUNTER — Telehealth: Payer: Self-pay

## 2019-08-09 NOTE — Telephone Encounter (Signed)
Patient called in to let Dr. Patsy Lager know that her Medical insurance [employer] will be sending Dr. Patsy Lager some information regarding getting her set up for mail order for her prescription's member number is :  [id number]

## 2019-08-19 ENCOUNTER — Other Ambulatory Visit: Payer: Self-pay

## 2019-08-19 DIAGNOSIS — E034 Atrophy of thyroid (acquired): Secondary | ICD-10-CM

## 2019-08-19 DIAGNOSIS — I10 Essential (primary) hypertension: Secondary | ICD-10-CM

## 2019-08-19 MED ORDER — LOSARTAN POTASSIUM 100 MG PO TABS: ORAL_TABLET | ORAL | 3 refills | Status: DC

## 2019-08-19 MED ORDER — LEVOTHYROXINE SODIUM 88 MCG PO TABS: 88.0000 ug | ORAL_TABLET | Freq: Every day | ORAL | 3 refills | Status: DC

## 2019-08-19 MED ORDER — METOPROLOL TARTRATE 50 MG PO TABS: 50.0000 mg | ORAL_TABLET | Freq: Two times a day (BID) | ORAL | 3 refills | Status: DC

## 2019-11-05 DIAGNOSIS — Z1231 Encounter for screening mammogram for malignant neoplasm of breast: Secondary | ICD-10-CM | POA: Diagnosis not present

## 2019-11-15 NOTE — Progress Notes (Signed)
I connected with Alicia Wallace today by telephone and verified that I am speaking with the correct person using two identifiers. Location patient: home Location provider: work Persons participating in the virtual visit: patient, Engineer, civil (consulting).  --  I discussed the limitations, risks, security and privacy concerns of performing an evaluation and management service by telephone and the availability of in person appointments. I also discussed with the patient that there may be a patient responsible charge related to this service. The patient expressed understanding and verbally consented to this telephonic visit.    Interactive audio and video telecommunications were attempted between this provider and patient, however failed, due to patient having technical difficulties OR patient did not have access to video capability.  We continued and completed visit with audio only.  Some vital signs may be absent or patient reported.    Subjective:   Alicia Wallace is a 74 y.o. female who presents for an Initial Medicare Annual Wellness Visit.  Review of Systems     Cardiac Risk Factors include: advanced age (>72men, >63 women);hypertension     Objective:    Today's Vitals   11/16/19 0803  BP: 120/80  Weight: 136 lb (61.7 kg)   Body mass index is 27.01 kg/m.  Advanced Directives 11/16/2019 11/09/2015  Does Patient Have a Medical Advance Directive? Yes No  Type of Estate agent of [place];Living will -  Does patient want to make changes to medical advance directive? No - Patient declined -  Copy of Healthcare Power of Attorney in Chart? No - copy requested -    Current Medications (verified) Outpatient Encounter Medications as of 11/16/2019  Medication Sig  . Calcium Citrate-Vitamin D (CITRUS CALCIUM 1500 + D PO) Take 1 tablet by mouth daily.   . Cholecalciferol (VITAMIN D3) 2000 UNITS TABS Take 2,000 Units by mouth daily.   Marland Kitchen estradiol (CLIMARA - DOSED IN MG/24 HR) 0.025 mg/24hr  patch APPLY 1 PATCH ONCE WEEKLY  . levocetirizine (XYZAL) 5 MG tablet TAKE 1 TABLET BY MOUTH EVERY DAY IN THE EVENING  . levothyroxine (SYNTHROID) 88 MCG tablet Take 1 tablet (88 mcg total) by mouth daily.  Marland Kitchen losartan (COZAAR) 100 MG tablet TAKE 1 TABLET BY MOUTH EVERY DAY  . metoprolol tartrate (LOPRESSOR) 50 MG tablet Take 1 tablet (50 mg total) by mouth 2 (two) times daily.  . nortriptyline (PAMELOR) 10 MG capsule Take 2 at bedtime as needed  . zolpidem (AMBIEN) 10 MG tablet Take 1/2 tablet at bedtime as needed for sleep. May take 1 tablet if necessary  . [DISCONTINUED] doxycycline (VIBRAMYCIN) 100 MG capsule Take 1 capsule (100 mg total) by mouth 2 (two) times daily.   No facility-administered encounter medications on file as of 11/16/2019.    Allergies (verified) Influenza vac split quad, Penicillins, and Sulfonamide derivatives   History: Past Medical History:  Diagnosis Date  . Allergic rhinitis   . Asthma   . Hypertension   . Hypothyroid    Past Surgical History:  Procedure Laterality Date  . APPENDECTOMY    . CHOLECYSTECTOMY    . TONSILLECTOMY    . TOTAL ABDOMINAL HYSTERECTOMY W/ BILATERAL SALPINGOOPHORECTOMY     Family History  Problem Relation Age of Onset  . Kidney disease Mother   . Heart disease Father   . Cancer Father        bladder  . Asthma Father   . Allergies Father    Social History   Socioeconomic History  . Marital status: Married  Spouse name: Not on file  . Number of children: 0  . Years of education: Not on file  . Highest education level: Not on file  Occupational History  . Not on file  Tobacco Use  . Smoking status: Never Smoker  . Smokeless tobacco: Never Used  Substance and Sexual Activity  . Alcohol use: No    Alcohol/week: 0.0 standard drinks  . Drug use: No  . Sexual activity: Not on file  Other Topics Concern  . Not on file  Social History Narrative   States exercises 3x week   Drinks 1/2 cup caffeine a day   separated     Social Determinants of Health   Financial Resource Strain: Low Risk   . Difficulty of Paying Living Expenses: Not hard at all  Food Insecurity: No Food Insecurity  . Worried About Programme researcher, broadcasting/film/video in the Last Year: Never true  . Ran Out of Food in the Last Year: Never true  Transportation Needs: No Transportation Needs  . Lack of Transportation (Medical): No  . Lack of Transportation (Non-Medical): No  Physical Activity:   . Days of Exercise per Week:   . Minutes of Exercise per Session:   Stress:   . Feeling of Stress :   Social Connections:   . Frequency of Communication with Friends and Family:   . Frequency of Social Gatherings with Friends and Family:   . Attends Religious Services:   . Active Member of Clubs or Organizations:   . Attends Banker Meetings:   Marland Kitchen Marital Status:     Tobacco Counseling Counseling given: Not Answered   Clinical Intake:     Pain : No/denies pain     Activities of Daily Living In your present state of health, do you have any difficulty performing the following activities: 11/16/2019  Hearing? N  Vision? N  Difficulty concentrating or making decisions? N  Walking or climbing stairs? N  Dressing or bathing? N  Doing errands, shopping? N  Preparing Food and eating ? N  Using the Toilet? N  In the past six months, have you accidently leaked urine? N  Do you have problems with loss of bowel control? N  Managing your Medications? N  Managing your Finances? N  Housekeeping or managing your Housekeeping? N  Some recent data might be hidden    Patient Care Team: Copland, Gwenlyn Found, MD as PCP - General (Family Medicine) Lynden Ang, NP as Nurse Practitioner (Obstetrics and Gynecology)  Indicate any recent Medical Services you may have received from other than [facility] providers in the past year (date may be approximate).     Assessment:   This is a routine wellness examination for Alicia Wallace.  Dietary issues and  exercise activities discussed: Current Exercise Habits: Home exercise routine, Type of exercise: walking, Time (Minutes): 20, Frequency (Times/Week): 5, Weekly Exercise (Minutes/Week): 100, Intensity: Mild, Exercise limited by: None identified Diet (meal preparation, eat out, water intake, caffeinated beverages, dairy products, fruits and vegetables): well balanced     Goals    . Maintain healthy active lifestyle.      Depression Screen PHQ 2/9 Scores 11/16/2019 08/27/2016 06/27/2015 01/18/2015 06/22/2014 02/17/2013  PHQ - 2 Score 0 0 0 0 0 0  Exception Documentation - - Patient refusal - - -    Fall Risk Fall Risk  11/16/2019 08/27/2016 06/27/2015 01/18/2015 06/22/2014  Falls in the past year? 0 No No No No  Number falls in past yr: 0 - - - -  Injury with Fall? 0 - - - -  Follow up Education provided;Falls prevention discussed - - - -    Any stairs in or around the home? No  If so, are there any without handrails? No  Home free of loose throw rugs in walkways, pet beds, electrical cords, etc? Yes  Adequate lighting in your home to reduce risk of falls? Yes     Cognitive Function: Ad8 score reviewed for issues:  Issues making decisions:no  Less interest in hobbies / activities:no  Repeats questions, stories (family complaining):no  Trouble using ordinary gadgets (microwave, computer, phone):no  Forgets the month or year: no  Mismanaging finances: non  Remembering appts:no Daily problems with thinking and/or memory:no Ad8 score is=0            Immunizations Immunization History  Administered Date(s) Administered  . Fluad Quad(high Dose 65+) 01/11/2019  . Influenza Split 02/10/2011, 03/03/2012, 01/11/2014, 12/28/2015  . Influenza Whole 01/31/2008, 02/28/2010  . Influenza, High Dose Seasonal PF 12/27/2014, 01/04/2017, 01/21/2018  . Influenza,inj,Quad PF,6+ Mos 01/17/2013  . Influenza-Unspecified 01/04/2017, 01/21/2018  . Pneumococcal Conjugate-13 12/22/2014, 11/29/2018    . Pneumococcal Polysaccharide-23 02/17/2013, 09/22/2017  . Tdap 07/10/2008, 07/31/2018  . Zoster Recombinat (Shingrix) 09/22/2017, 11/22/2017    TDAP status: Up to date Flu Vaccine status: Up to date Pneumococcal vaccine status: Up to date Covid-19 vaccine status: Completed vaccines. Pt will call back w/ dates of completion.   Qualifies for Shingles Vaccine?   Shingrix Completed?: Yes  Screening Tests Health Maintenance  Topic Date Due  . COVID-19 Vaccine (1) Never done  . MAMMOGRAM  11/04/2020  . Fecal DNA (Cologuard)  09/21/2021  . TETANUS/TDAP  07/30/2028  . DEXA SCAN  Completed  . Hepatitis C Screening  Completed  . PNA vac Low Risk Adult  Completed    Health Maintenance  Health Maintenance Due  Topic Date Due  . COVID-19 Vaccine (1) Never done    Colorectal cancer screening: Completed 09/21/18. Repeat every 3 years Mammogram status: Completed 06/19/18. Repeat every year Bone Density status: Completed 04/15/18. Results reflect: Bone density results: OSTEOPENIA. Repeat every 2 years.  Lung Cancer Screening: (Low Dose CT Chest recommended if Age 71-80 years, 30 pack-year currently smoking OR have quit w/in 15years.) does not qualify.    Additional Screening:  Hepatitis C Screening: does qualify; Completed 08/27/16  Vision Screening: Recommended annual ophthalmology exams for early detection of glaucoma and other disorders of the eye. Is the patient up to date with their annual eye exam?  Yes  Who is the provider or what is the name of the office in which the patient attends annual eye exams? [facility] in [place]   Dental Screening: Recommended annual dental exams for proper oral hygiene  Community Resource Referral / Chronic Care Management: CRR required this visit?  No   CCM required this visit?  No      Plan:    Please schedule your next medicare wellness visit with me in 1 yr.  Continue to eat heart healthy diet (full of fruits, vegetables, whole  grains, lean protein, water--limit salt, fat, and sugar intake) and increase physical activity as tolerated.    I have personally reviewed and noted the following in the patient's chart:   . Medical and social history . Use of alcohol, tobacco or illicit drugs  . Current medications and supplements . Functional ability and status . Nutritional status . Physical activity . Advanced directives . List of other physicians . Hospitalizations, surgeries,  and ER visits in previous 12 months . Vitals . Screenings to include cognitive, depression, and falls . Referrals and appointments  In addition, I have reviewed and discussed with patient certain preventive protocols, quality metrics, and best practice recommendations. A written personalized care plan for preventive services as well as general preventive health recommendations were provided to patient.   Due to this being a telephonic visit, the after visit summary with patients personalized plan was offered to patient via mail or my-chart. Patient would like to access on my-chart.   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

## 2019-11-16 ENCOUNTER — Encounter: Payer: Self-pay | Admitting: *Deleted

## 2019-11-16 ENCOUNTER — Other Ambulatory Visit: Payer: Self-pay

## 2019-11-16 ENCOUNTER — Telehealth: Payer: Self-pay | Admitting: Family Medicine

## 2019-11-16 ENCOUNTER — Ambulatory Visit (INDEPENDENT_AMBULATORY_CARE_PROVIDER_SITE_OTHER): Payer: Medicare HMO | Admitting: *Deleted

## 2019-11-16 VITALS — BP 120/80 | Wt 136.0 lb

## 2019-11-16 DIAGNOSIS — Z0000 Encounter for general adult medical examination without abnormal findings: Secondary | ICD-10-CM

## 2019-11-16 NOTE — Patient Instructions (Signed)
Alicia Wallace , Thank you for taking time to come for your Medicare Wellness Visit. I appreciate your ongoing commitment to your health goals. Please review the following plan we discussed and let me know if I can assist you in the future.   Screening recommendations/referrals: Colorectal cancer screening: Completed 09/21/18. Repeat every 3 years Mammogram status: Completed 06/19/18. Repeat every year Bone Density status: Completed 04/15/18. Results reflect: Bone density results: OSTEOPENIA. Repeat every 2 years. Recommended yearly ophthalmology/optometry visit for glaucoma screening and checkup Recommended yearly dental visit for hygiene and checkup  Vaccinations: TDAP status: Up to date Flu Vaccine status: Up to date Pneumococcal vaccine status: Up to date Covid-19 vaccine status: Completed vaccines. Pt will call back w/ dates of completion.   Advanced directives: Bring a copy of your living will and/or healthcare power of attorney to your next office visit.   Next appointment: Follow up in one year for your annual wellness visit    Preventive Care 65 Years and Older, Female Preventive care refers to lifestyle choices and visits with your health care provider that can promote health and wellness. What does preventive care include?  A yearly physical exam. This is also called an annual well check.  Dental exams once or twice a year.  Routine eye exams. Ask your health care provider how often you should have your eyes checked.  Personal lifestyle choices, including:  Daily care of your teeth and gums.  Regular physical activity.  Eating a healthy diet.  Avoiding tobacco and drug use.  Limiting alcohol use.  Practicing safe sex.  Taking low-dose aspirin every day.  Taking vitamin and mineral supplements as recommended by your health care provider. What happens during an annual well check? The services and screenings done by your health care provider during your annual well  check will depend on your age, overall health, lifestyle risk factors, and family history of disease. Counseling  Your health care provider may ask you questions about your:  Alcohol use.  Tobacco use.  Drug use.  Emotional well-being.  Home and relationship well-being.  Sexual activity.  Eating habits.  History of falls.  Memory and ability to understand (cognition).  Work and work Astronomer.  Reproductive health. Screening  You may have the following tests or measurements:  Height, weight, and BMI.  Blood pressure.  Lipid and cholesterol levels. These may be checked every 5 years, or more frequently if you are over 80 years old.  Skin check.  Lung cancer screening. You may have this screening every year starting at age 72 if you have a 30-pack-year history of smoking and currently smoke or have quit within the past 15 years.  Fecal occult blood test (FOBT) of the stool. You may have this test every year starting at age 72.  Flexible sigmoidoscopy or colonoscopy. You may have a sigmoidoscopy every 5 years or a colonoscopy every 10 years starting at age 72.  Hepatitis C blood test.  Hepatitis B blood test.  Sexually transmitted disease (STD) testing.  Diabetes screening. This is done by checking your blood sugar (glucose) after you have not eaten for a while (fasting). You may have this done every 1-3 years.  Bone density scan. This is done to screen for osteoporosis. You may have this done starting at age 72.  Mammogram. This may be done every 1-2 years. Talk to your health care provider about how often you should have regular mammograms. Talk with your health care provider about your test results, treatment  options, and if necessary, the need for more tests. Vaccines  Your health care provider may recommend certain vaccines, such as:  Influenza vaccine. This is recommended every year.  Tetanus, diphtheria, and acellular pertussis (Tdap, Td) vaccine. You  may need a Td booster every 10 years.  Zoster vaccine. You may need this after age 72.  Pneumococcal 13-valent conjugate (PCV13) vaccine. One dose is recommended after age 72.  Pneumococcal polysaccharide (PPSV23) vaccine. One dose is recommended after age 72. Talk to your health care provider about which screenings and vaccines you need and how often you need them. This information is not intended to replace advice given to you by your health care provider. Make sure you discuss any questions you have with your health care provider. Document Released: 05/25/2015 Document Revised: 01/16/2016 Document Reviewed: 02/27/2015 Elsevier Interactive Patient Education  2017 [place] Prevention in the Home Falls can cause injuries. They can happen to people of all ages. There are many things you can do to make your home safe and to help prevent falls. What can I do on the outside of my home?  Regularly fix the edges of walkways and driveways and fix any cracks.  Remove anything that might make you trip as you walk through a door, such as a raised step or threshold.  Trim any bushes or trees on the path to your home.  Use bright outdoor lighting.  Clear any walking paths of anything that might make someone trip, such as rocks or tools.  Regularly check to see if handrails are loose or broken. Make sure that both sides of any steps have handrails.  Any raised decks and porches should have guardrails on the edges.  Have any leaves, snow, or ice cleared regularly.  Use sand or salt on walking paths during winter.  Clean up any spills in your garage right away. This includes oil or grease spills. What can I do in the bathroom?  Use night lights.  Install grab bars by the toilet and in the tub and shower. Do not use towel bars as grab bars.  Use non-skid mats or decals in the tub or shower.  If you need to sit down in the shower, use a plastic, non-slip stool.  Keep the floor  dry. Clean up any water that spills on the floor as soon as it happens.  Remove soap buildup in the tub or shower regularly.  Attach bath mats securely with double-sided non-slip rug tape.  Do not have throw rugs and other things on the floor that can make you trip. What can I do in the bedroom?  Use night lights.  Make sure that you have a light by your bed that is easy to reach.  Do not use any sheets or blankets that are too big for your bed. They should not hang down onto the floor.  Have a firm chair that has side arms. You can use this for support while you get dressed.  Do not have throw rugs and other things on the floor that can make you trip. What can I do in the kitchen?  Clean up any spills right away.  Avoid walking on wet floors.  Keep items that you use a lot in easy-to-reach places.  If you need to reach something above you, use a strong step stool that has a grab bar.  Keep electrical cords out of the way.  Do not use floor polish or wax that makes floors slippery.  If you must use wax, use non-skid floor wax.  Do not have throw rugs and other things on the floor that can make you trip. What can I do with my stairs?  Do not leave any items on the stairs.  Make sure that there are handrails on both sides of the stairs and use them. Fix handrails that are broken or loose. Make sure that handrails are as long as the stairways.  Check any carpeting to make sure that it is firmly attached to the stairs. Fix any carpet that is loose or worn.  Avoid having throw rugs at the top or bottom of the stairs. If you do have throw rugs, attach them to the floor with carpet tape.  Make sure that you have a light switch at the top of the stairs and the bottom of the stairs. If you do not have them, ask someone to add them for you. What else can I do to help prevent falls?  Wear shoes that:  Do not have high heels.  Have rubber bottoms.  Are comfortable and fit you  well.  Are closed at the toe. Do not wear sandals.  If you use a stepladder:  Make sure that it is fully opened. Do not climb a closed stepladder.  Make sure that both sides of the stepladder are locked into place.  Ask someone to hold it for you, if possible.  Clearly mark and make sure that you can see:  Any grab bars or handrails.  First and last steps.  Where the edge of each step is.  Use tools that help you move around (mobility aids) if they are needed. These include:  Canes.  Walkers.  Scooters.  Crutches.  Turn on the lights when you go into a dark area. Replace any light bulbs as soon as they burn out.  Set up your furniture so you have a clear path. Avoid moving your furniture around.  If any of your floors are uneven, fix them.  If there are any pets around you, be aware of where they are.  Review your medicines with your doctor. Some medicines can make you feel dizzy. This can increase your chance of falling. Ask your doctor what other things that you can do to help prevent falls. This information is not intended to replace advice given to you by your health care provider. Make sure you discuss any questions you have with your health care provider. Document Released: 02/22/2009 Document Revised: 10/04/2015 Document Reviewed: 06/02/2014 Elsevier Interactive Patient Education  2017 [employer].

## 2019-11-16 NOTE — Telephone Encounter (Signed)
Covid Vaccines   Pfizer  Date : 08/04/2019 (First Dose ) Date : 08/25/2019 (second dose)

## 2019-12-16 DIAGNOSIS — Z01419 Encounter for gynecological examination (general) (routine) without abnormal findings: Secondary | ICD-10-CM | POA: Diagnosis not present

## 2020-01-18 DIAGNOSIS — F341 Dysthymic disorder: Secondary | ICD-10-CM | POA: Diagnosis not present

## 2020-01-25 ENCOUNTER — Telehealth: Payer: Self-pay | Admitting: Family Medicine

## 2020-01-25 DIAGNOSIS — J301 Allergic rhinitis due to pollen: Secondary | ICD-10-CM

## 2020-01-25 MED ORDER — LEVOCETIRIZINE DIHYDROCHLORIDE 5 MG PO TABS: ORAL_TABLET | ORAL | 0 refills | Status: DC

## 2020-01-25 NOTE — Telephone Encounter (Signed)
Medication: levocetirizine (XYZAL) 5 MG tablet    Has the patient contacted their pharmacy? No. (If no, request that the patient contact the pharmacy for the refill.) (If yes, when and what did the pharmacy advise?)  Preferred Pharmacy (with phone number or street name):  [facility] - [place], [place] - [address] Phone:  [phone]  Fax:  [phone]       Agent: Please be advised that RX refills may take up to 3 business days. We ask that you follow-up with your pharmacy.

## 2020-01-25 NOTE — Telephone Encounter (Signed)
Med refill sent to pharmacy  

## 2020-03-22 ENCOUNTER — Other Ambulatory Visit: Payer: Self-pay | Admitting: Family Medicine

## 2020-03-22 DIAGNOSIS — J301 Allergic rhinitis due to pollen: Secondary | ICD-10-CM

## 2020-05-25 ENCOUNTER — Other Ambulatory Visit: Payer: Self-pay | Admitting: Family Medicine

## 2020-05-25 ENCOUNTER — Encounter: Payer: Self-pay | Admitting: Family Medicine

## 2020-05-25 DIAGNOSIS — J301 Allergic rhinitis due to pollen: Secondary | ICD-10-CM

## 2020-05-25 DIAGNOSIS — M85852 Other specified disorders of bone density and structure, left thigh: Secondary | ICD-10-CM | POA: Diagnosis not present

## 2020-05-30 ENCOUNTER — Encounter: Payer: Self-pay | Admitting: Family Medicine

## 2020-06-25 ENCOUNTER — Other Ambulatory Visit: Payer: Self-pay | Admitting: Family Medicine

## 2020-06-25 DIAGNOSIS — J301 Allergic rhinitis due to pollen: Secondary | ICD-10-CM

## 2020-06-28 ENCOUNTER — Other Ambulatory Visit: Payer: Self-pay | Admitting: Family Medicine

## 2020-06-28 DIAGNOSIS — I10 Essential (primary) hypertension: Secondary | ICD-10-CM

## 2020-06-28 DIAGNOSIS — E034 Atrophy of thyroid (acquired): Secondary | ICD-10-CM

## 2020-07-02 ENCOUNTER — Other Ambulatory Visit: Payer: Self-pay | Admitting: Family Medicine

## 2020-07-02 DIAGNOSIS — I10 Essential (primary) hypertension: Secondary | ICD-10-CM

## 2020-08-04 NOTE — Progress Notes (Deleted)
[facility] [address] [place], [place] [address] [phone]  Date:  08/08/2020   Name:  Alicia Wallace   DOB:  05-Dec-1945   MRN:  [id number]  PCP:  Pearline Cables, MD    Chief Complaint: No chief complaint on file.   History of Present Illness:  Alicia Wallace is a 75 y.o. very pleasant female patient who presents with the following:  Here today for a follow-up visit  Last seen by myself in December 2020- Pt with history of HTN and hypothyroidism  covid booster mammo is UTD Last BW 2019  Patient Active Problem List   Diagnosis Date Noted  . Other and unspecified hyperlipidemia 08/22/2013  . HTN (hypertension) 04/06/2012  . Osteopenia 04/06/2012  . Seasonal and perennial allergic rhinitis 07/15/2010  . HYPOTHYROIDISM 05/30/2008  . Asthma, mild intermittent 07/14/2007    Past Medical History:  Diagnosis Date  . Allergic rhinitis   . Asthma   . Hypertension   . Hypothyroid     Past Surgical History:  Procedure Laterality Date  . APPENDECTOMY    . CHOLECYSTECTOMY    . TONSILLECTOMY    . TOTAL ABDOMINAL HYSTERECTOMY W/ BILATERAL SALPINGOOPHORECTOMY      Social History   Tobacco Use  . Smoking status: Never Smoker  . Smokeless tobacco: Never Used  Substance Use Topics  . Alcohol use: No    Alcohol/week: 0.0 standard drinks  . Drug use: No    Family History  Problem Relation Age of Onset  . Kidney disease Mother   . Heart disease Father   . Cancer Father        bladder  . Asthma Father   . Allergies Father     Allergies  Allergen Reactions  . Influenza Vac Split Quad Other (See Comments)    Size of grapefruit red, swollen area at injection site  Leg cramps  Vaccine given was Fluarix Quadrivalent 2014/2015 formula by [employer]  . Penicillins Hives    Has patient had a PCN reaction causing immediate rash, facial/tongue/throat swelling, SOB or lightheadedness with hypotension:  yes Has patient had a PCN reaction causing severe rash involving mucus membranes or skin necrosis: no Has patient had a PCN reaction that required hospitalization: no Has patient had a PCN reaction occurring within the last 10 years: no If all of the above answers are "NO", then may proceed with Cephalosporin use.   . Sulfonamide Derivatives Hives    Medication list has been reviewed and updated.  Current Outpatient Medications on File Prior to Visit  Medication Sig Dispense Refill  . Calcium Citrate-Vitamin D (CITRUS CALCIUM 1500 + D PO) Take 1 tablet by mouth daily.     . Cholecalciferol (VITAMIN D3) 2000 UNITS TABS Take 2,000 Units by mouth daily.     Marland Kitchen estradiol (CLIMARA - DOSED IN MG/24 HR) 0.025 mg/24hr patch APPLY 1 PATCH ONCE WEEKLY  2  . levocetirizine (XYZAL) 5 MG tablet TAKE 1 TABLET BY MOUTH EVERY DAY IN THE EVENING 90 tablet 0  . levothyroxine (SYNTHROID) 88 MCG tablet TAKE 1 TABLET EVERY DAY 90 tablet 3  . losartan (COZAAR) 100 MG tablet Take 1 tablet (100 mg total) by mouth daily. OVERDUE FOR APPT 90 tablet 0  . metoprolol tartrate (LOPRESSOR) 50 MG tablet TAKE 1 TABLET TWICE DAILY 180 tablet 3  . nortriptyline (PAMELOR) 10 MG capsule Take 2 at bedtime as needed 180 capsule 3  .  zolpidem (AMBIEN) 10 MG tablet Take 1/2 tablet at bedtime as needed for sleep. May take 1 tablet if necessary 30 tablet 5   No current facility-administered medications on file prior to visit.    Review of Systems:  As per HPI- otherwise negative.   Physical Examination: There were no vitals filed for this visit. There were no vitals filed for this visit. There is no height or weight on file to calculate BMI. Ideal Body Weight:    GEN: no acute distress. HEENT: Atraumatic, Normocephalic.  Ears and Nose: No external deformity. CV: RRR, No M/G/R. No JVD. No thrill. No extra heart sounds. PULM: CTA B, no wheezes, crackles, rhonchi. No retractions. No resp. distress. No accessory muscle  use. ABD: S, NT, ND, +BS. No rebound. No HSM. EXTR: No c/c/e PSYCH: Normally interactive. Conversant.    Assessment and Plan: *** This visit occurred during the SARS-CoV-2 public health emergency.  Safety protocols were in place, including screening questions prior to the visit, additional usage of staff PPE, and extensive cleaning of exam room while observing appropriate contact time as indicated for disinfecting solutions.    Signed Abbe Amsterdam, MD

## 2020-08-04 NOTE — Patient Instructions (Incomplete)
Good to see you again today  

## 2020-08-07 DIAGNOSIS — I10 Essential (primary) hypertension: Secondary | ICD-10-CM | POA: Diagnosis not present

## 2020-08-07 DIAGNOSIS — Z0100 Encounter for examination of eyes and vision without abnormal findings: Secondary | ICD-10-CM | POA: Diagnosis not present

## 2020-08-07 DIAGNOSIS — H5210 Myopia, unspecified eye: Secondary | ICD-10-CM | POA: Diagnosis not present

## 2020-08-08 ENCOUNTER — Ambulatory Visit: Payer: Medicare HMO | Admitting: Family Medicine

## 2020-08-09 ENCOUNTER — Telehealth: Payer: Self-pay | Admitting: Family Medicine

## 2020-08-09 DIAGNOSIS — J301 Allergic rhinitis due to pollen: Secondary | ICD-10-CM

## 2020-08-09 DIAGNOSIS — I10 Essential (primary) hypertension: Secondary | ICD-10-CM

## 2020-08-09 DIAGNOSIS — G5600 Carpal tunnel syndrome, unspecified upper limb: Secondary | ICD-10-CM

## 2020-08-09 DIAGNOSIS — E034 Atrophy of thyroid (acquired): Secondary | ICD-10-CM

## 2020-08-09 MED ORDER — LOSARTAN POTASSIUM 100 MG PO TABS: 100.0000 mg | ORAL_TABLET | Freq: Every day | ORAL | 0 refills | Status: DC

## 2020-08-09 MED ORDER — LEVOCETIRIZINE DIHYDROCHLORIDE 5 MG PO TABS: ORAL_TABLET | ORAL | 0 refills | Status: DC

## 2020-08-09 MED ORDER — METOPROLOL TARTRATE 50 MG PO TABS: 50.0000 mg | ORAL_TABLET | Freq: Two times a day (BID) | ORAL | 0 refills | Status: DC

## 2020-08-09 MED ORDER — LEVOTHYROXINE SODIUM 88 MCG PO TABS: 88.0000 ug | ORAL_TABLET | Freq: Every day | ORAL | 0 refills | Status: DC

## 2020-08-09 NOTE — Telephone Encounter (Signed)
[facility]) - [place], [place] - [address] Phone:  [phone]  Fax:  [phone]     Patient is using this pharmacy now

## 2020-08-09 NOTE — Telephone Encounter (Signed)
Medication: levothyroxine (SYNTHROID) 88 MCG tablet [[id number]  losartan (COZAAR) 100 MG tablet [[id number]]   metoprolol tartrate (LOPRESSOR) 50 MG tablet [[id number]]   levocetirizine (XYZAL) 5 MG tablet [[id number]]   Has the patient contacted their pharmacy? No. (If no, request that the patient contact the pharmacy for the refill.) (If yes, when and what did the pharmacy advise?)  Preferred Pharmacy (with phone number or street name):   Agent: Please be advised that RX refills may take up to 3 business days. We ask that you follow-up with your pharmacy.

## 2020-08-09 NOTE — Telephone Encounter (Signed)
Medications refilled to pharmacy. Patient needs appointment for further refills. Follow up scheduled 4/27

## 2020-09-01 NOTE — Progress Notes (Addendum)
[facility] [address] [place], [place] [address] [phone]  Date:  09/05/2020   Name:  Alicia Wallace   DOB:  [birth year]   MRN:  [id number]  PCP:  Pearline Cables, MD    Chief Complaint: Hypertension and Hyperlipidemia (Follow up, med renewal)   History of Present Illness:  Alicia Wallace is a 75 y.o. 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  No CP or abnormal SOB with exercise   Patient Active Problem List   Diagnosis Date Noted  . Other and unspecified hyperlipidemia 08/22/2013  . HTN (hypertension) 04/06/2012  . Osteopenia 04/06/2012  . Seasonal and perennial allergic rhinitis 07/15/2010  . HYPOTHYROIDISM 05/30/2008  . Asthma, mild intermittent 07/14/2007    Past Medical History:  Diagnosis Date  . Allergic rhinitis   . Asthma   . Hypertension   . Hypothyroid     Past Surgical History:  Procedure Laterality Date  . APPENDECTOMY    . CHOLECYSTECTOMY    . TONSILLECTOMY    . TOTAL ABDOMINAL HYSTERECTOMY W/ BILATERAL SALPINGOOPHORECTOMY      Social History   Tobacco Use  . Smoking status: Never Smoker  . Smokeless tobacco: Never Used  Substance Use Topics  . Alcohol use: No    Alcohol/week: 0.0 standard drinks  . Drug use: No    Family History  Problem Relation Age of Onset  . Kidney disease Mother   . Heart disease Father   . Cancer Father        bladder  . Asthma Father   . Allergies Father      Allergies  Allergen Reactions  . Influenza Vac Split Quad Other (See Comments)    Size of grapefruit red, swollen area at injection site  Leg cramps  Vaccine given was Fluarix Quadrivalent 2014/2015 formula by [employer]  . Penicillins Hives    Has patient had a PCN reaction causing immediate rash, facial/tongue/throat swelling, SOB or lightheadedness with hypotension: yes Has patient had a PCN reaction causing severe rash involving mucus membranes or skin necrosis: no Has patient had a PCN reaction that required hospitalization: no Has patient had a PCN reaction occurring within the last 10 years: no If all of the above answers are "NO", then may proceed with Cephalosporin use.   . Sulfonamide Derivatives Hives    Medication list has been reviewed and updated.  Current Outpatient Medications on File Prior to Visit  Medication Sig Dispense Refill  . Calcium Citrate-Vitamin D (CITRUS CALCIUM 1500 + D PO) Take 1 tablet by mouth daily.     . Cholecalciferol (VITAMIN D3) 2000 UNITS TABS Take 2,000 Units by mouth daily.     Marland Kitchen estradiol (CLIMARA - DOSED IN MG/24 HR) 0.025 mg/24hr patch APPLY 1 PATCH ONCE WEEKLY  2  . levothyroxine (SYNTHROID) 88 MCG tablet Take 1 tablet (88 mcg total) by mouth daily. NEEDS OV FOR FURTHER REFILLS 30 tablet 0  . metoprolol tartrate (LOPRESSOR) 50 MG tablet  Take 1 tablet (50 mg total) by mouth 2 (two) times daily. NEEDS OV FOR FURTHER REFILLS 60 tablet 0  . nortriptyline (PAMELOR) 10 MG capsule Take 2 at bedtime as needed 180 capsule 3  . zolpidem (AMBIEN) 10 MG tablet Take 1/2 tablet at bedtime as needed for sleep. May take 1 tablet if necessary 30 tablet 5   No current facility-administered medications on file prior to visit.    Review of Systems:  As per HPI- otherwise negative.   Physical Examination: Vitals:   09/05/20 0817  BP: 118/68  Pulse: 91  SpO2: 98%   Vitals:   09/05/20 0817  Weight: 151 lb (68.5 kg)  Height: 4' 11.5" (1.511 m)    Body mass index is 29.99 kg/m. Ideal Body Weight: Weight in (lb) to have BMI = 25: 125.6  GEN: no acute distress. HEENT: Atraumatic, Normocephalic.  Ears and Nose: No external deformity. CV: RRR, No M/G/R. No JVD. No thrill. No extra heart sounds. PULM: CTA B, no wheezes, crackles, rhonchi. No retractions. No resp. distress. No accessory muscle use. ABD: S, NT, ND, +BS. No rebound. No HSM. EXTR: No c/c/e PSYCH: Normally interactive. Conversant.    Assessment and Plan: Hypothyroidism due to acquired atrophy of thyroid - Plan: TSH  Essential hypertension - Plan: CBC, Comprehensive metabolic panel, losartan (COZAAR) 100 MG tablet  Osteopenia, unspecified location  Hyperlipidemia associated with type 2 diabetes mellitus (HCC) - Plan: Lipid panel  Fatigue, unspecified type - Plan: TSH, VITAMIN D 25 Hydroxy (Vit-D Deficiency, Fractures)  Screening for diabetes mellitus - Plan: Hemoglobin A1c  Seasonal allergic rhinitis due to pollen - Plan: levocetirizine (XYZAL) 5 MG tablet  Fill thyroid med once TSH comes in Plan to visit in 6 months  Encouraged continues healthy lifestyle habits Will plan further follow- up pending labs. Encourage covid 4th dose This visit occurred during the SARS-CoV-2 public health emergency.  Safety protocols were in place, including screening questions prior to the visit, additional usage of staff PPE, and extensive cleaning of exam room while observing appropriate contact time as indicated for disinfecting solutions.    Signed Abbe Amsterdam, MD  Received her labs as below, message to patient  Results for orders placed or performed in visit on 09/05/20  CBC  Result Value Ref Range   WBC 10.6 (H) 4.0 - 10.5 K/uL   RBC 4.07 3.87 - 5.11 Mil/uL   Platelets 314.0 150.0 - 400.0 K/uL   Hemoglobin 12.6 12.0 - 15.0 g/dL   HCT 86.5 78.4 - 69.6 %   MCV 91.4 78.0 - 100.0 fl   MCHC 33.8 30.0 - 36.0 g/dL   RDW 29.5 28.4 - 13.2 %  Comprehensive metabolic  panel  Result Value Ref Range   Sodium 137 135 - 145 mEq/L   Potassium 4.7 3.5 - 5.1 mEq/L   Chloride 102 96 - 112 mEq/L   CO2 28 19 - 32 mEq/L   Glucose, Bld 82 70 - 99 mg/dL   BUN 14 6 - 23 mg/dL   Creatinine, Ser 4.40 0.40 - 1.20 mg/dL   Total Bilirubin 0.4 0.2 - 1.2 mg/dL   Alkaline Phosphatase 68 39 - 117 U/L   AST 16 0 - 37 U/L   ALT 14 0 - 35 U/L   Total Protein 6.7 6.0 - 8.3 g/dL   Albumin 4.0 3.5 - 5.2 g/dL   GFR 10.27 >25.36 mL/min   Calcium 9.5 8.4 - 10.5 mg/dL  Hemoglobin U4Q  Result Value Ref Range  Hgb A1c MFr Bld 6.0 4.6 - 6.5 %  Lipid panel  Result Value Ref Range   Cholesterol 206 (H) 0 - 200 mg/dL   Triglycerides 053.9 (H) 0.0 - 149.0 mg/dL   HDL 76.73 >41.93 mg/dL   VLDL 79.0 0.0 - 24.0 mg/dL   LDL Cholesterol 973 (H) 0 - 99 mg/dL   Total CHOL/HDL Ratio 4    NonHDL 155.84   TSH  Result Value Ref Range   TSH 1.60 0.35 - 4.50 uIU/mL  VITAMIN D 25 Hydroxy (Vit-D Deficiency, Fractures)  Result Value Ref Range   VITD 44.95 30.00 - 100.00 ng/mL  HM DIABETES EYE EXAM  Result Value Ref Range   HM Diabetic Eye Exam No Retinopathy No Retinopathy

## 2020-09-01 NOTE — Patient Instructions (Addendum)
It was very nice to see you again today, I will be in touch with the results of soon as possible Keep up the good work!  Please get your 4th dose of covid 19 at your convenience

## 2020-09-05 ENCOUNTER — Encounter: Payer: Self-pay | Admitting: Family Medicine

## 2020-09-05 ENCOUNTER — Ambulatory Visit: Payer: Medicare PPO | Attending: Internal Medicine

## 2020-09-05 ENCOUNTER — Ambulatory Visit (INDEPENDENT_AMBULATORY_CARE_PROVIDER_SITE_OTHER): Payer: Medicare PPO | Admitting: Family Medicine

## 2020-09-05 ENCOUNTER — Telehealth: Payer: Self-pay | Admitting: Family Medicine

## 2020-09-05 ENCOUNTER — Other Ambulatory Visit: Payer: Self-pay

## 2020-09-05 VITALS — BP 118/68 | HR 91 | Ht 59.5 in | Wt 151.0 lb

## 2020-09-05 DIAGNOSIS — E785 Hyperlipidemia, unspecified: Secondary | ICD-10-CM

## 2020-09-05 DIAGNOSIS — Z131 Encounter for screening for diabetes mellitus: Secondary | ICD-10-CM | POA: Diagnosis not present

## 2020-09-05 DIAGNOSIS — Z23 Encounter for immunization: Secondary | ICD-10-CM

## 2020-09-05 DIAGNOSIS — J301 Allergic rhinitis due to pollen: Secondary | ICD-10-CM

## 2020-09-05 DIAGNOSIS — E1169 Type 2 diabetes mellitus with other specified complication: Secondary | ICD-10-CM | POA: Diagnosis not present

## 2020-09-05 DIAGNOSIS — R5383 Other fatigue: Secondary | ICD-10-CM | POA: Diagnosis not present

## 2020-09-05 DIAGNOSIS — I10 Essential (primary) hypertension: Secondary | ICD-10-CM

## 2020-09-05 DIAGNOSIS — M8580 Other specified disorders of bone density and structure, unspecified site: Secondary | ICD-10-CM

## 2020-09-05 DIAGNOSIS — E034 Atrophy of thyroid (acquired): Secondary | ICD-10-CM | POA: Diagnosis not present

## 2020-09-05 LAB — CBC
HCT: 37.2 % (ref 36.0–46.0)
Hemoglobin: 12.6 g/dL (ref 12.0–15.0)
MCHC: 33.8 g/dL (ref 30.0–36.0)
MCV: 91.4 fl (ref 78.0–100.0)
Platelets: 314 10*3/uL (ref 150.0–400.0)
RBC: 4.07 Mil/uL (ref 3.87–5.11)
RDW: 13.3 % (ref 11.5–15.5)
WBC: 10.6 10*3/uL — ABNORMAL HIGH (ref 4.0–10.5)

## 2020-09-05 LAB — COMPREHENSIVE METABOLIC PANEL
ALT: 14 U/L (ref 0–35)
AST: 16 U/L (ref 0–37)
Albumin: 4 g/dL (ref 3.5–5.2)
Alkaline Phosphatase: 68 U/L (ref 39–117)
BUN: 14 mg/dL (ref 6–23)
CO2: 28 mEq/L (ref 19–32)
Calcium: 9.5 mg/dL (ref 8.4–10.5)
Chloride: 102 mEq/L (ref 96–112)
Creatinine, Ser: 0.66 mg/dL (ref 0.40–1.20)
GFR: 87.22 mL/min (ref >60.00)
Glucose, Bld: 82 mg/dL (ref 70–99)
Potassium: 4.7 mEq/L (ref 3.5–5.1)
Sodium: 137 mEq/L (ref 135–145)
Total Bilirubin: 0.4 mg/dL (ref 0.2–1.2)
Total Protein: 6.7 g/dL (ref 6.0–8.3)

## 2020-09-05 LAB — LIPID PANEL
Cholesterol: 206 mg/dL — ABNORMAL HIGH (ref 0–200)
HDL: 50 mg/dL (ref >39.00)
LDL Cholesterol: 120 mg/dL — ABNORMAL HIGH (ref 0–99)
NonHDL: 155.84
Total CHOL/HDL Ratio: 4
Triglycerides: 178 mg/dL — ABNORMAL HIGH (ref 0.0–149.0)
VLDL: 35.6 mg/dL (ref 0.0–40.0)

## 2020-09-05 LAB — HEMOGLOBIN A1C: Hgb A1c MFr Bld: 6 % (ref 4.6–6.5)

## 2020-09-05 LAB — TSH: TSH: 1.6 u[IU]/mL (ref 0.35–4.50)

## 2020-09-05 LAB — VITAMIN D 25 HYDROXY (VIT D DEFICIENCY, FRACTURES): VITD: 44.95 ng/mL (ref 30.00–100.00)

## 2020-09-05 MED ORDER — LEVOCETIRIZINE DIHYDROCHLORIDE 5 MG PO TABS: ORAL_TABLET | ORAL | 3 refills | Status: DC

## 2020-09-05 MED ORDER — LOSARTAN POTASSIUM 100 MG PO TABS: 100.0000 mg | ORAL_TABLET | Freq: Every day | ORAL | 3 refills | Status: DC

## 2020-09-05 MED ORDER — LEVOTHYROXINE SODIUM 88 MCG PO TABS: 88.0000 ug | ORAL_TABLET | Freq: Every day | ORAL | 3 refills | Status: DC

## 2020-09-05 NOTE — Telephone Encounter (Signed)
When patient's labs come back, please mail a copy to her home address

## 2020-09-05 NOTE — Addendum Note (Signed)
Addended by: Abbe Amsterdam C on: 09/05/2020 08:30 PM   Modules accepted: Orders

## 2020-09-05 NOTE — Progress Notes (Signed)
   Covid-19 Vaccination Clinic  Name:  Alicia Wallace    MRN: [id number] DOB: 11/16/1945  09/05/2020  [name] was observed post Covid-19 immunization for 15 minutes without incident. She was provided with Vaccine Information Sheet and instruction to access the V-Safe system.   [name] was instructed to call 911 with any severe reactions post vaccine: Marland Kitchen Difficulty breathing  . Swelling of face and throat  . A fast heartbeat  . A bad rash all over body  . Dizziness and weakness   Immunizations Administered    Name Date Dose VIS Date Route   PFIZER Comrnaty(Gray TOP) Covid-19 Vaccine 09/05/2020  9:10 AM 0.3 mL 04/19/2020 Intramuscular   Manufacturer: [employer], [employer]   Lot: [id number]   NDC: [phone]

## 2020-09-06 NOTE — Telephone Encounter (Signed)
Results have been mailed

## 2020-09-07 ENCOUNTER — Telehealth: Payer: Self-pay | Admitting: Family Medicine

## 2020-09-07 ENCOUNTER — Other Ambulatory Visit (HOSPITAL_BASED_OUTPATIENT_CLINIC_OR_DEPARTMENT_OTHER): Payer: Self-pay

## 2020-09-07 DIAGNOSIS — I10 Essential (primary) hypertension: Secondary | ICD-10-CM

## 2020-09-07 DIAGNOSIS — E034 Atrophy of thyroid (acquired): Secondary | ICD-10-CM

## 2020-09-07 DIAGNOSIS — J301 Allergic rhinitis due to pollen: Secondary | ICD-10-CM

## 2020-09-07 MED ORDER — PFIZER-BIONT COVID-19 VAC-TRIS 30 MCG/0.3ML IM SUSP
INTRAMUSCULAR | 0 refills | Status: DC
  Filled 2020-09-07: qty 0.3, 1d supply, fill #0

## 2020-09-07 MED ORDER — LEVOCETIRIZINE DIHYDROCHLORIDE 5 MG PO TABS: ORAL_TABLET | ORAL | 3 refills | Status: DC

## 2020-09-07 MED ORDER — LEVOTHYROXINE SODIUM 88 MCG PO TABS: 88.0000 ug | ORAL_TABLET | Freq: Every day | ORAL | 3 refills | Status: DC

## 2020-09-07 MED ORDER — LOSARTAN POTASSIUM 100 MG PO TABS: 100.0000 mg | ORAL_TABLET | Freq: Every day | ORAL | 3 refills | Status: DC

## 2020-09-07 NOTE — Telephone Encounter (Signed)
Medication sent to pharmacy  

## 2020-09-07 NOTE — Telephone Encounter (Signed)
Patient is calling in reference to medication not being received by pharmacy.   patient would like to resend medication to pharmacy.  levocetirizine (XYZAL) 5 MG tablet [[id number]]   levothyroxine (SYNTHROID) 88 MCG tablet [[id number]]   losartan (COZAAR) 100 MG tablet [[id number]]    [facility] - [place], [place] - [address] Phone:  [phone]  Fax:  [phone]

## 2020-09-11 ENCOUNTER — Other Ambulatory Visit: Payer: Self-pay | Admitting: Family Medicine

## 2020-09-11 ENCOUNTER — Telehealth: Payer: Self-pay | Admitting: Family Medicine

## 2020-09-11 DIAGNOSIS — I10 Essential (primary) hypertension: Secondary | ICD-10-CM

## 2020-09-11 DIAGNOSIS — J301 Allergic rhinitis due to pollen: Secondary | ICD-10-CM

## 2020-09-11 MED ORDER — LEVOCETIRIZINE DIHYDROCHLORIDE 5 MG PO TABS: ORAL_TABLET | ORAL | 3 refills | Status: DC

## 2020-09-11 NOTE — Telephone Encounter (Signed)
Pt, called to get her lab results

## 2020-09-11 NOTE — Telephone Encounter (Signed)
Patient states her pharmacy has not received refill for xyzal.  Please advise

## 2020-09-11 NOTE — Telephone Encounter (Signed)
Re-sent to pharmacy.

## 2020-09-11 NOTE — Addendum Note (Signed)
Addended by: Steve Rattler A on: 09/11/2020 04:23 PM   Modules accepted: Orders

## 2020-09-12 ENCOUNTER — Telehealth: Payer: Self-pay | Admitting: Family Medicine

## 2020-09-12 MED ORDER — METOPROLOL TARTRATE 50 MG PO TABS: 50.0000 mg | ORAL_TABLET | Freq: Two times a day (BID) | ORAL | 1 refills | Status: DC

## 2020-09-12 NOTE — Telephone Encounter (Signed)
Pt, states  it ok to send Medication:  levothyroxine (SYNTHROID) 88 MCG tablet   losartan (COZAAR) 100 MG tablet [[id number]   metoprolol tartrate (LOPRESSOR) 50 MG tablet [[id number]]     Has the patient contacted their pharmacy? no (If no, request that the patient contact the pharmacy for the refill.) (If yes, when and what did the pharmacy advise?)    Preferred Pharmacy (with phone number or street name):  [facility] - [place], [place] - [address] Phone:  [phone]  Fax:  [phone]         Agent: Please be advised that RX refills may take up to 3 business days. We ask that you follow-up with your pharmacy.

## 2020-09-12 NOTE — Telephone Encounter (Signed)
All medications have been sent in.

## 2020-09-12 NOTE — Telephone Encounter (Signed)
Advised patient about her results.  She was unable to get thru mychart.  She also needed [facility].  Rx sent in.

## 2020-09-14 ENCOUNTER — Other Ambulatory Visit: Payer: Self-pay | Admitting: Family Medicine

## 2020-09-14 DIAGNOSIS — I10 Essential (primary) hypertension: Secondary | ICD-10-CM

## 2020-11-27 ENCOUNTER — Telehealth: Payer: Self-pay | Admitting: Family Medicine

## 2020-11-27 NOTE — Telephone Encounter (Signed)
Copied from CRM [id number]. Topic: Medicare AWV >> Nov 27, 2020 10:35 AM Harris-Coley, Avon Gully wrote: Reason for CRM: Left message for patient to schedule Annual Wellness Visit.  Please schedule with Health Nurse Advisor Clare Gandy. at [facility].

## 2020-12-07 NOTE — Progress Notes (Signed)
Subjective:   Alicia Wallace is a 75 y.o. female who presents for Medicare Annual (Subsequent) preventive examination.  I connected with [name] today by telephone and verified that I am speaking with the correct person using two identifiers. Location patient: home Location provider: work Persons participating in the virtual visit: patient, Engineer, civil (consulting).    I discussed the limitations, risks, security and privacy concerns of performing an evaluation and management service by telephone and the availability of in person appointments. I also discussed with the patient that there may be a patient responsible charge related to this service. The patient expressed understanding and verbally consented to this telephonic visit.    Interactive audio and video telecommunications were attempted between this provider and patient, however failed, due to patient having technical difficulties OR patient did not have access to video capability.  We continued and completed visit with audio only.  Some vital signs may be absent or patient reported.   Time Spent with patient on telephone encounter: 20 minutes  Review of Systems     Cardiac Risk Factors include: advanced age (>40men, >30 women);hypertension;dyslipidemia     Objective:    Today's Vitals   12/10/20 0939  Weight: 135 lb (61.2 kg)  Height: 4' 11.5" (1.511 m)   Body mass index is 26.81 kg/m.  Advanced Directives 12/10/2020 11/16/2019 11/09/2015  Does Patient Have a Medical Advance Directive? No Yes No  Type of Advance Directive - Healthcare Power of [place];Living will -  Does patient want to make changes to medical advance directive? - No - Patient declined -  Copy of Healthcare Power of Attorney in Chart? - No - copy requested -  Would patient like information on creating a medical advance directive? No - Patient declined - -    Current Medications (verified) Outpatient Encounter Medications as of 12/10/2020  Medication Sig   Calcium  Citrate-Vitamin D (CITRUS CALCIUM 1500 + D PO) Take 1 tablet by mouth daily.    Cholecalciferol (VITAMIN D3) 2000 UNITS TABS Take 2,000 Units by mouth daily.    estradiol (CLIMARA - DOSED IN MG/24 HR) 0.025 mg/24hr patch APPLY 1 PATCH ONCE WEEKLY   levocetirizine (XYZAL) 5 MG tablet TAKE 1 TABLET EVERY DAY IN THE EVENING (NEED MD APPOINTMENT)   levothyroxine (SYNTHROID) 88 MCG tablet Take 1 tablet (88 mcg total) by mouth daily.   losartan (COZAAR) 100 MG tablet TAKE 1 TABLET EVERY DAY (NEED MD APPOINTMENT AND/OR LABS BEFORE FURTHER REFILLS)   metoprolol tartrate (LOPRESSOR) 50 MG tablet Take 1 tablet (50 mg total) by mouth 2 (two) times daily.   nortriptyline (PAMELOR) 10 MG capsule Take 2 at bedtime as needed   zolpidem (AMBIEN) 10 MG tablet Take 1/2 tablet at bedtime as needed for sleep. May take 1 tablet if necessary   [DISCONTINUED] COVID-19 mRNA Vac-TriS, Pfizer, (PFIZER-BIONT COVID-19 VAC-TRIS) SUSP injection Inject into the muscle.   No facility-administered encounter medications on file as of 12/10/2020.    Allergies (verified) Influenza vac split quad, Penicillins, and Sulfonamide derivatives   History: Past Medical History:  Diagnosis Date   Allergic rhinitis    Asthma    Hypertension    Hypothyroid    Past Surgical History:  Procedure Laterality Date   APPENDECTOMY     CHOLECYSTECTOMY     TONSILLECTOMY     TOTAL ABDOMINAL HYSTERECTOMY W/ BILATERAL SALPINGOOPHORECTOMY     Family History  Problem Relation Age of Onset   Kidney disease Mother    Heart disease Father  Cancer Father        bladder   Asthma Father    Allergies Father    Social History   Socioeconomic History   Marital status: Divorced    Spouse name: Not on file   Number of children: 0   Years of education: Not on file   Highest education level: Not on file  Occupational History   Not on file  Tobacco Use   Smoking status: Never   Smokeless tobacco: Never  Substance and Sexual Activity    Alcohol use: No    Alcohol/week: 0.0 standard drinks   Drug use: No   Sexual activity: Not on file  Other Topics Concern   Not on file  Social History Narrative   States exercises 3x week   Drinks 1/2 cup caffeine a day   separated   Social Determinants of Health   Financial Resource Strain: Low Risk    Difficulty of Paying Living Expenses: Not hard at all  Food Insecurity: No Food Insecurity   Worried About Programme researcher, broadcasting/film/videounning Out of Food in the Last Year: Never true   Baristaan Out of Food in the Last Year: Never true  Transportation Needs: No Transportation Needs   Lack of Transportation (Medical): No   Lack of Transportation (Non-Medical): No  Physical Activity: Insufficiently Active   Days of Exercise per Week: 3 days   Minutes of Exercise per Session: 20 min  Stress: No Stress Concern Present   Feeling of Stress : Not at all  Social Connections: Moderately Isolated   Frequency of Communication with Friends and Family: More than three times a week   Frequency of Social Gatherings with Friends and Family: More than three times a week   Attends Religious Services: More than 4 times per year   Active Member of [employer] Financial[employer]ubs or Organizations: No   Attends Engineer, structuralClub or Organization Meetings: Never   Marital Status: Divorced    Tobacco Counseling Counseling given: Not Answered   Clinical Intake:  Pre-visit preparation completed: Yes  Pain : No/denies pain     Nutritional Status: BMI 25 -29 Overweight Nutritional Risks: None Diabetes: No  How often do you need to have someone help you when you read instructions, pamphlets, or other written materials from your doctor or pharmacy?: 1 - Never  Diabetic?No  Interpreter Needed?: No  Information entered by :: Thomasenia SalesMartha Joclyn Alsobrook LPN   Activities of Daily Living In your present state of health, do you have any difficulty performing the following activities: 12/10/2020  Hearing? N  Vision? N  Difficulty concentrating or making decisions? N  Walking  or climbing stairs? N  Dressing or bathing? N  Doing errands, shopping? N  Preparing Food and eating ? N  Using the Toilet? N  In the past six months, have you accidently leaked urine? N  Do you have problems with loss of bowel control? N  Managing your Medications? N  Managing your Finances? N  Housekeeping or managing your Housekeeping? N  Some recent data might be hidden    Patient Care Team: Copland, Gwenlyn FoundJessica C, MD as PCP - General (Family Medicine) Lynden AngLane, Elizabeth, NP as Nurse Practitioner (Obstetrics and Gynecology)  Indicate any recent Medical Services you may have received from other than [facility] providers in the past year (date may be approximate).     Assessment:   This is a routine wellness examination for Alicia Wallace.  Hearing/Vision screen Hearing Screening - Comments:: No issues Vision Screening - Comments:: Wears glasses Last eye exam-08/2020-[facility]  Dietary issues and exercise activities discussed: Current Exercise Habits: Home exercise routine, Type of exercise: walking, Time (Minutes): 20, Frequency (Times/Week): 3, Weekly Exercise (Minutes/Week): 60, Intensity: Mild, Exercise limited by: None identified   Goals Addressed             This Visit's Progress    Maintain healthy active lifestyle.   On track      Depression Screen PHQ 2/9 Scores 12/10/2020 11/16/2019 08/27/2016 06/27/2015 01/18/2015 06/22/2014 02/17/2013  PHQ - 2 Score 0 0 0 0 0 0 0  Exception Documentation - - - Patient refusal - - -    Fall Risk Fall Risk  12/10/2020 11/16/2019 08/27/2016 06/27/2015 01/18/2015  Falls in the past year? 0 0 No No No  Number falls in past yr: 0 0 - - -  Injury with Fall? 0 0 - - -  Follow up Falls prevention discussed Education provided;Falls prevention discussed - - -    FALL RISK PREVENTION PERTAINING TO THE HOME:  Any stairs in or around the home? No  Home free of loose throw rugs in walkways, pet beds, electrical cords, etc? Yes  Adequate lighting in your home to  reduce risk of falls? Yes   ASSISTIVE DEVICES UTILIZED TO PREVENT FALLS:  Life alert? No  Use of a cane, [place] or w/c? No  Grab bars in the bathroom? Yes  Shower chair or bench in shower? No  Elevated toilet seat or a handicapped toilet? No   TIMED UP AND GO:  Was the test performed? No . Phone visit   Cognitive Function:Normal cognitive status assessed by this Nurse Health Advisor. No abnormalities found.          Immunizations Immunization History  Administered Date(s) Administered   Fluad Quad(high Dose 65+) 01/11/2019   Influenza Split 02/10/2011, 03/03/2012, 01/11/2014, 12/28/2015   Influenza Whole 01/31/2008, 02/28/2010   Influenza, High Dose Seasonal PF 12/27/2014, 01/04/2017, 01/21/2018   Influenza,inj,Quad PF,6+ Mos 01/17/2013   Influenza-Unspecified 01/04/2017, 01/21/2018   PFIZER Comirnaty(Gray Top)Covid-19 Tri-Sucrose Vaccine 09/05/2020   PFIZER(Purple Top)SARS-COV-2 Vaccination 08/04/2019, 08/25/2019   Pneumococcal Conjugate-13 12/22/2014, 11/29/2018   Pneumococcal Polysaccharide-23 02/17/2013, 09/22/2017   Tdap 07/10/2008, 07/31/2018   Zoster Recombinat (Shingrix) 09/22/2017, 11/22/2017    TDAP status: Up to date  Flu Vaccine status: Up to date  Pneumococcal vaccine status: Up to date  Covid-19 vaccine status: Completed vaccines  Qualifies for Shingles Vaccine? No   Zostavax completed No   Shingrix Completed?: Yes  Screening Tests Health Maintenance  Topic Date Due   MAMMOGRAM  11/04/2020   COVID-19 Vaccine (4 - Booster for Pfizer series) 01/05/2021   Fecal DNA (Cologuard)  09/21/2021   TETANUS/TDAP  07/30/2028   DEXA SCAN  Completed   Hepatitis C Screening  Completed   PNA vac Low Risk Adult  Completed   Zoster Vaccines- Shingrix  Completed   HPV VACCINES  Aged Out    Health Maintenance  Health Maintenance Due  Topic Date Due   MAMMOGRAM  11/04/2020    Colorectal cancer screening: Type of screening: Cologuard. Completed 09/22/2018.  Repeat every 3 years  Mammogram status: Per patient, scheduled for this month.  Bone Density status: Completed 05/15/2020. Results reflect: Bone density results: OSTEOPENIA. Repeat every 2 years.  Lung Cancer Screening: (Low Dose CT Chest recommended if Age 24-80 years, 30 pack-year currently smoking OR have quit w/in 15years.) does not qualify.    Additional Screening:  Hepatitis C Screening:  Completed 08/27/2016  Vision Screening: Recommended  annual ophthalmology exams for early detection of glaucoma and other disorders of the eye. Is the patient up to date with their annual eye exam?  Yes  Who is the provider or what is the name of the office in which the patient attends annual eye exams? [facility]   Dental Screening: Recommended annual dental exams for proper oral hygiene  Community Resource Referral / Chronic Care Management: CRR required this visit?  No   CCM required this visit?  No      Plan:     I have personally reviewed and noted the following in the patient's chart:   Medical and social history Use of alcohol, tobacco or illicit drugs  Current medications and supplements including opioid prescriptions.  Functional ability and status Nutritional status Physical activity Advanced directives List of other physicians Hospitalizations, surgeries, and ER visits in previous 12 months Vitals Screenings to include cognitive, depression, and falls Referrals and appointments  In addition, I have reviewed and discussed with patient certain preventive protocols, quality metrics, and best practice recommendations. A written personalized care plan for preventive services as well as general preventive health recommendations were provided to patient.   Due to this being a telephonic visit, the after visit summary with patients personalized plan was offered to patient via mail or my-chart. Patient would like to access on my-chart.   Roanna Raider, LPN   [id number]/10/6604  Nurse  Health Advisor  Nurse Notes: None

## 2020-12-10 ENCOUNTER — Ambulatory Visit (INDEPENDENT_AMBULATORY_CARE_PROVIDER_SITE_OTHER): Payer: Medicare PPO

## 2020-12-10 VITALS — Ht 59.5 in | Wt 135.0 lb

## 2020-12-10 DIAGNOSIS — Z0000 Encounter for general adult medical examination without abnormal findings: Secondary | ICD-10-CM | POA: Diagnosis not present

## 2020-12-10 NOTE — Patient Instructions (Signed)
Alicia Wallace , Thank you for taking time to complete your Medicare Wellness Visit. I appreciate your ongoing commitment to your health goals. Please review the following plan we discussed and let me know if I can assist you in the future.   Screening recommendations/referrals: Colonoscopy: Cologuard completed 09/22/2018-Due 09/21/2021 Mammogram: Scheduled for this month. Bone Density: Completed 05/15/2020-Due 05/15/2022 Recommended yearly ophthalmology/optometry visit for glaucoma screening and checkup Recommended yearly dental visit for hygiene and checkup  Vaccinations: Influenza vaccine: Up to date Pneumococcal vaccine: Up to date Tdap vaccine: Up to date-Due 07/30/2028 Shingles vaccine: Completed vaccines   Covid-19:Up to date  Advanced directives: Declined information today  Conditions/risks identified: See problem list  Next appointment: Follow up in one year for your annual wellness visit 12/17/2021 @ 8:20   Preventive Care 65 Years and Older, Female Preventive care refers to lifestyle choices and visits with your health care provider that can promote health and wellness. What does preventive care include? A yearly physical exam. This is also called an annual well check. Dental exams once or twice a year. Routine eye exams. Ask your health care provider how often you should have your eyes checked. Personal lifestyle choices, including: Daily care of your teeth and gums. Regular physical activity. Eating a healthy diet. Avoiding tobacco and drug use. Limiting alcohol use. Practicing safe sex. Taking low-dose aspirin every day. Taking vitamin and mineral supplements as recommended by your health care provider. What happens during an annual well check? The services and screenings done by your health care provider during your annual well check will depend on your age, overall health, lifestyle risk factors, and family history of disease. Counseling  Your health care provider may ask  you questions about your: Alcohol use. Tobacco use. Drug use. Emotional well-being. Home and relationship well-being. Sexual activity. Eating habits. History of falls. Memory and ability to understand (cognition). Work and work Astronomer. Reproductive health. Screening  You may have the following tests or measurements: Height, weight, and BMI. Blood pressure. Lipid and cholesterol levels. These may be checked every 5 years, or more frequently if you are over 5 years old. Skin check. Lung cancer screening. You may have this screening every year starting at age 84 if you have a 30-pack-year history of smoking and currently smoke or have quit within the past 15 years. Fecal occult blood test (FOBT) of the stool. You may have this test every year starting at age 57. Flexible sigmoidoscopy or colonoscopy. You may have a sigmoidoscopy every 5 years or a colonoscopy every 10 years starting at age 69. Hepatitis C blood test. Hepatitis B blood test. Sexually transmitted disease (STD) testing. Diabetes screening. This is done by checking your blood sugar (glucose) after you have not eaten for a while (fasting). You may have this done every 1-3 years. Bone density scan. This is done to screen for osteoporosis. You may have this done starting at age 35. Mammogram. This may be done every 1-2 years. Talk to your health care provider about how often you should have regular mammograms. Talk with your health care provider about your test results, treatment options, and if necessary, the need for more tests. Vaccines  Your health care provider may recommend certain vaccines, such as: Influenza vaccine. This is recommended every year. Tetanus, diphtheria, and acellular pertussis (Tdap, Td) vaccine. You may need a Td booster every 10 years. Zoster vaccine. You may need this after age 7. Pneumococcal 13-valent conjugate (PCV13) vaccine. One dose is recommended after age 69.  Pneumococcal  polysaccharide (PPSV23) vaccine. One dose is recommended after age 52. Talk to your health care provider about which screenings and vaccines you need and how often you need them. This information is not intended to replace advice given to you by your health care provider. Make sure you discuss any questions you have with your health care provider. Document Released: 05/25/2015 Document Revised: 01/16/2016 Document Reviewed: 02/27/2015 Elsevier Interactive Patient Education  2017 [employer] Prevention in the Home Falls can cause injuries. They can happen to people of all ages. There are many things you can do to make your home safe and to help prevent falls. What can I do on the outside of my home? Regularly fix the edges of walkways and driveways and fix any cracks. Remove anything that might make you trip as you walk through a door, such as a raised step or threshold. Trim any bushes or trees on the path to your home. Use bright outdoor lighting. Clear any walking paths of anything that might make someone trip, such as rocks or tools. Regularly check to see if handrails are loose or broken. Make sure that both sides of any steps have handrails. Any raised decks and porches should have guardrails on the edges. Have any leaves, snow, or ice cleared regularly. Use sand or salt on walking paths during winter. Clean up any spills in your garage right away. This includes oil or grease spills. What can I do in the bathroom? Use night lights. Install grab bars by the toilet and in the tub and shower. Do not use towel bars as grab bars. Use non-skid mats or decals in the tub or shower. If you need to sit down in the shower, use a plastic, non-slip stool. Keep the floor dry. Clean up any water that spills on the floor as soon as it happens. Remove soap buildup in the tub or shower regularly. Attach bath mats securely with double-sided non-slip rug tape. Do not have throw rugs and other  things on the floor that can make you trip. What can I do in the bedroom? Use night lights. Make sure that you have a light by your bed that is easy to reach. Do not use any sheets or blankets that are too big for your bed. They should not hang down onto the floor. Have a firm chair that has side arms. You can use this for support while you get dressed. Do not have throw rugs and other things on the floor that can make you trip. What can I do in the kitchen? Clean up any spills right away. Avoid walking on wet floors. Keep items that you use a lot in easy-to-reach places. If you need to reach something above you, use a strong step stool that has a grab bar. Keep electrical cords out of the way. Do not use floor polish or wax that makes floors slippery. If you must use wax, use non-skid floor wax. Do not have throw rugs and other things on the floor that can make you trip. What can I do with my stairs? Do not leave any items on the stairs. Make sure that there are handrails on both sides of the stairs and use them. Fix handrails that are broken or loose. Make sure that handrails are as long as the stairways. Check any carpeting to make sure that it is firmly attached to the stairs. Fix any carpet that is loose or worn. Avoid having throw rugs at the  top or bottom of the stairs. If you do have throw rugs, attach them to the floor with carpet tape. Make sure that you have a light switch at the top of the stairs and the bottom of the stairs. If you do not have them, ask someone to add them for you. What else can I do to help prevent falls? Wear shoes that: Do not have high heels. Have rubber bottoms. Are comfortable and fit you well. Are closed at the toe. Do not wear sandals. If you use a stepladder: Make sure that it is fully opened. Do not climb a closed stepladder. Make sure that both sides of the stepladder are locked into place. Ask someone to hold it for you, if possible. Clearly  mark and make sure that you can see: Any grab bars or handrails. First and last steps. Where the edge of each step is. Use tools that help you move around (mobility aids) if they are needed. These include: Canes. Walkers. Scooters. Crutches. Turn on the lights when you go into a dark area. Replace any light bulbs as soon as they burn out. Set up your furniture so you have a clear path. Avoid moving your furniture around. If any of your floors are uneven, fix them. If there are any pets around you, be aware of where they are. Review your medicines with your doctor. Some medicines can make you feel dizzy. This can increase your chance of falling. Ask your doctor what other things that you can do to help prevent falls. This information is not intended to replace advice given to you by your health care provider. Make sure you discuss any questions you have with your health care provider. Document Released: 02/22/2009 Document Revised: 10/04/2015 Document Reviewed: 06/02/2014 Elsevier Interactive Patient Education  2017 [employer].

## 2021-01-02 DIAGNOSIS — Z124 Encounter for screening for malignant neoplasm of cervix: Secondary | ICD-10-CM | POA: Diagnosis not present

## 2021-01-02 DIAGNOSIS — Z79890 Hormone replacement therapy (postmenopausal): Secondary | ICD-10-CM | POA: Diagnosis not present

## 2021-01-02 DIAGNOSIS — Z01419 Encounter for gynecological examination (general) (routine) without abnormal findings: Secondary | ICD-10-CM | POA: Diagnosis not present

## 2021-01-02 DIAGNOSIS — Z01411 Encounter for gynecological examination (general) (routine) with abnormal findings: Secondary | ICD-10-CM | POA: Diagnosis not present

## 2021-01-02 DIAGNOSIS — Z6827 Body mass index (BMI) 27.0-27.9, adult: Secondary | ICD-10-CM | POA: Diagnosis not present

## 2021-01-02 DIAGNOSIS — N751 Abscess of Bartholin's gland: Secondary | ICD-10-CM | POA: Diagnosis not present

## 2021-01-10 DIAGNOSIS — N751 Abscess of Bartholin's gland: Secondary | ICD-10-CM | POA: Diagnosis not present

## 2021-01-18 DIAGNOSIS — N751 Abscess of Bartholin's gland: Secondary | ICD-10-CM | POA: Diagnosis not present

## 2021-01-19 DIAGNOSIS — Z1231 Encounter for screening mammogram for malignant neoplasm of breast: Secondary | ICD-10-CM | POA: Diagnosis not present

## 2021-01-19 LAB — HM MAMMOGRAPHY

## 2021-01-22 DIAGNOSIS — F341 Dysthymic disorder: Secondary | ICD-10-CM | POA: Diagnosis not present

## 2021-03-11 ENCOUNTER — Ambulatory Visit: Payer: Medicare PPO | Admitting: Family Medicine

## 2021-03-19 ENCOUNTER — Telehealth: Payer: Self-pay | Admitting: Family Medicine

## 2021-03-19 NOTE — Telephone Encounter (Signed)
Patient called stating she has a sinus infection and would like Copland to send her something for it. She was advised to make an appointment for it but she stated that Dr. Patsy Lager usually sends her a prescription for it. She stated that it  started on 11/04 and she's had a sore throat with it, no fever. If able please send medication to [facility] [address], [place] [address] tel. [phone]. Please advice.

## 2021-03-20 ENCOUNTER — Telehealth (INDEPENDENT_AMBULATORY_CARE_PROVIDER_SITE_OTHER): Payer: Medicare HMO | Admitting: Family Medicine

## 2021-03-20 ENCOUNTER — Other Ambulatory Visit: Payer: Self-pay

## 2021-03-20 DIAGNOSIS — J0111 Acute recurrent frontal sinusitis: Secondary | ICD-10-CM | POA: Diagnosis not present

## 2021-03-20 MED ORDER — DOXYCYCLINE HYCLATE 100 MG PO CAPS: 100.0000 mg | ORAL_CAPSULE | Freq: Two times a day (BID) | ORAL | 0 refills | Status: DC

## 2021-03-20 MED ORDER — BENZONATATE 200 MG PO CAPS: 200.0000 mg | ORAL_CAPSULE | Freq: Three times a day (TID) | ORAL | 0 refills | Status: DC | PRN

## 2021-03-20 NOTE — Telephone Encounter (Signed)
Patient made aware of VV needed in order to get medication. She would want Copland to call her at 71 today to [phone] since she does not have a computer/smart phone. Virtual appointment will be added.

## 2021-03-20 NOTE — Telephone Encounter (Signed)
I do not generally rx antibiotics without an evaluation actually.   Please call pt- I could see her for a virtual visit if she would like at 10am or noon?  We would need to add an appt spot for her

## 2021-03-20 NOTE — Progress Notes (Signed)
[facility] [address], [address] [place], [place] [address] [phone]  Date:  03/20/2021   Name:  Alicia Wallace   DOB:  September 04, 1945   MRN:  [id number]  PCP:  Pearline Cables, MD    Chief Complaint: No chief complaint on file.   History of Present Illness:  Alicia Wallace is a 75 y.o. very pleasant female patient who presents with the following:  Telephone visit today for concern of illness.  Patient location is home, I am in office.  Patient identity confirmed with 2 factors, she gives consent for virtual visit  Last visit with myself was in April of this year History of hypertension, hypothyroidism, hyperlipidemia  Today she notes that she tends to get a sinus infection this time of year- her sx started about 5 days ago She has noted sinus congestion - she seems to be improving but not resolved yet No fever No vomiting or diarrhea  She notes some sinus pressure and ear pressure She is blowing mucus out of her nose and mouth  No ST  She did take a covid test and it was negative     Patient Active Problem List   Diagnosis Date Noted   Other and unspecified hyperlipidemia 08/22/2013   HTN (hypertension) 04/06/2012   Osteopenia 04/06/2012   Seasonal and perennial allergic rhinitis 07/15/2010   HYPOTHYROIDISM 05/30/2008   Asthma, mild intermittent 07/14/2007    Past Medical History:  Diagnosis Date   Allergic rhinitis    Asthma    Hypertension    Hypothyroid     Past Surgical History:  Procedure Laterality Date   APPENDECTOMY     CHOLECYSTECTOMY     TONSILLECTOMY     TOTAL ABDOMINAL HYSTERECTOMY W/ BILATERAL SALPINGOOPHORECTOMY      Social History   Tobacco Use   Smoking status: Never   Smokeless tobacco: Never  Substance Use Topics   Alcohol use: No    Alcohol/week: 0.0 standard drinks   Drug use: No    Family History  Problem Relation Age of Onset   Kidney disease Mother    Heart disease  Father    Cancer Father        bladder   Asthma Father    Allergies Father     Allergies  Allergen Reactions   Influenza Vac Split Quad Other (See Comments)    Size of grapefruit red, swollen area at injection site  Leg cramps  Vaccine given was Fluarix Quadrivalent 2014/2015 formula by [employer]   Penicillins Hives    Has patient had a PCN reaction causing immediate rash, facial/tongue/throat swelling, SOB or lightheadedness with hypotension: yes Has patient had a PCN reaction causing severe rash involving mucus membranes or skin necrosis: no Has patient had a PCN reaction that required hospitalization: no Has patient had a PCN reaction occurring within the last 10 years: no If all of the above answers are "NO", then may proceed with Cephalosporin use.    Sulfonamide Derivatives Hives    Medication list has been reviewed and updated.  Current Outpatient Medications on File Prior to Visit  Medication Sig Dispense Refill   Calcium Citrate-Vitamin D (CITRUS CALCIUM 1500 + D PO) Take 1 tablet by mouth daily.      Cholecalciferol (VITAMIN D3) 2000 UNITS TABS Take 2,000 Units by mouth daily.      estradiol (CLIMARA - DOSED IN MG/24 HR) 0.025 mg/24hr patch APPLY 1 PATCH  ONCE WEEKLY  2   levocetirizine (XYZAL) 5 MG tablet TAKE 1 TABLET EVERY DAY IN THE EVENING (NEED MD APPOINTMENT) 90 tablet 3   levothyroxine (SYNTHROID) 88 MCG tablet Take 1 tablet (88 mcg total) by mouth daily. 90 tablet 3   losartan (COZAAR) 100 MG tablet TAKE 1 TABLET EVERY DAY (NEED MD APPOINTMENT AND/OR LABS BEFORE FURTHER REFILLS) 90 tablet 3   metoprolol tartrate (LOPRESSOR) 50 MG tablet Take 1 tablet (50 mg total) by mouth 2 (two) times daily. 180 tablet 1   nortriptyline (PAMELOR) 10 MG capsule Take 2 at bedtime as needed 180 capsule 3   zolpidem (AMBIEN) 10 MG tablet Take 1/2 tablet at bedtime as needed for sleep. May take 1 tablet if necessary 30 tablet 5   No current facility-administered medications on file  prior to visit.    Review of Systems:  As per HPI- otherwise negative.    Physical Examination: There were no vitals filed for this visit. There were no vitals filed for this visit. There is no height or weight on file to calculate BMI. Ideal Body Weight:    Spoke with patient over telephone.  She sounds well, her normal self.  No shortness of breath or distress is noted   Assessment and Plan: Acute recurrent frontal sinusitis - Plan: doxycycline (VIBRAMYCIN) 100 MG capsule, benzonatate (TESSALON) 200 MG capsule  Telephone visit today for concern of sinusitis Patient notes she is normally treated with doxycycline which works well for her Prescribe doxycycline today, she would also like a nondrowsy cough medication.  Described Tessalon Perles prescribed  She is asked to alert [place] if not feeling better in the next few days  Spoke with patient for approximately 5 minutes today Signed Abbe Amsterdam, MD

## 2021-04-07 ENCOUNTER — Other Ambulatory Visit: Payer: Self-pay | Admitting: Family Medicine

## 2021-04-07 DIAGNOSIS — E034 Atrophy of thyroid (acquired): Secondary | ICD-10-CM

## 2021-04-07 DIAGNOSIS — I10 Essential (primary) hypertension: Secondary | ICD-10-CM

## 2021-04-08 ENCOUNTER — Ambulatory Visit: Payer: Medicare PPO | Admitting: Family Medicine

## 2021-04-24 ENCOUNTER — Ambulatory Visit: Payer: Self-pay | Admitting: Family Medicine

## 2021-05-26 NOTE — Progress Notes (Addendum)
[facility] [address], [address] [place], [place] [address] [phone]  Date:  06/03/2021   Name:  Alicia Wallace   DOB:  02/04/1946   MRN:  [id number]  PCP:  Pearline Cables, MD    Chief Complaint: 6 month follow up (Concerns/ questions: pt requests her labs to be mailed to her/Eye exam: April 2022: [facility] Dr[facility]land Kitchen Roque Lias )   History of Present Illness:  Alicia Wallace is a 76 y.o. 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 Enjoys walking for exercise; she walks about twice a week, about 30 minutes a session No CP or SOB  She still sees Medical laboratory scientific officer / Therapist, occupational with GYN at [employer] COVID booster Cologuard, mammogram up-to-date Bone density UTD as well  Most recent labs in April-CMP, lipid, CBC, A1c, thyroid  She is fasting except for a small amount of cereal   She uses nortriptyline prn or ambien for sleep   Lab Results  Component Value Date   TSH 1.60 09/05/2020   Lab Results  Component Value Date   HGBA1C 6.0 09/05/2020    Patient Active Problem List   Diagnosis Date Noted   Prediabetes 06/03/2021   Other and unspecified hyperlipidemia 08/22/2013   HTN (hypertension) 04/06/2012   Osteopenia 04/06/2012   Seasonal and perennial allergic rhinitis 07/15/2010   HYPOTHYROIDISM 05/30/2008   Asthma, mild intermittent 07/14/2007    Past Medical History:  Diagnosis Date   Allergic rhinitis    Asthma    Hypertension    Hypothyroid     Past Surgical History:  Procedure Laterality Date   APPENDECTOMY     CHOLECYSTECTOMY     TONSILLECTOMY     TOTAL ABDOMINAL HYSTERECTOMY W/ BILATERAL SALPINGOOPHORECTOMY      Social History   Tobacco Use    Smoking status: Never   Smokeless tobacco: Never  Substance Use Topics   Alcohol use: No    Alcohol/week: 0.0 standard drinks   Drug use: No    Family History  Problem Relation Age of Onset   Kidney disease Mother    Heart disease Father    Cancer Father        bladder   Asthma Father    Allergies Father     Allergies  Allergen Reactions   Influenza Vac Split Quad Other (See Comments)    Size of grapefruit red, swollen area at injection site  Leg cramps  Vaccine given was Fluarix Quadrivalent 2014/2015 formula by [employer]   Penicillins Hives    Has patient had a PCN reaction causing immediate rash, facial/tongue/throat swelling, SOB or lightheadedness with hypotension: yes Has patient had a PCN reaction causing severe rash involving mucus membranes or skin necrosis: no Has patient had a PCN reaction that required hospitalization: no Has patient had a PCN reaction occurring within the last 10 years: no If all of the above answers are "NO", then may proceed with Cephalosporin use.    Sulfonamide Derivatives Hives    Medication list has been reviewed and updated.  Current Outpatient Medications on File Prior to Visit  Medication Sig Dispense Refill   Calcium Citrate-Vitamin D (CITRUS CALCIUM 1500 + D PO) Take 1 tablet by mouth daily.  Cholecalciferol (VITAMIN D3) 2000 UNITS TABS Take 2,000 Units by mouth daily.      estradiol (CLIMARA - DOSED IN MG/24 HR) 0.025 mg/24hr patch APPLY 1 PATCH ONCE WEEKLY  2   levocetirizine (XYZAL) 5 MG tablet TAKE 1 TABLET EVERY DAY IN THE EVENING (NEED MD APPOINTMENT) 90 tablet 3   nortriptyline (PAMELOR) 10 MG capsule Take 2 at bedtime as needed 180 capsule 3   zolpidem (AMBIEN) 10 MG tablet Take 1/2 tablet at bedtime as needed for sleep. May take 1 tablet if necessary 30 tablet 5   No current facility-administered medications on file prior to visit.    Review of Systems:  As per HPI- otherwise negative.   Physical  Examination: Vitals:   06/03/21 0816  BP: 122/72  Pulse: 84  Resp: 18  Temp: 97.7 F (36.5 C)  SpO2: 97%   Vitals:   06/03/21 0816  Weight: 151 lb 3.2 oz (68.6 kg)  Height: 5' (1.524 m)   Body mass index is 29.53 kg/m. Ideal Body Weight: Weight in (lb) to have BMI = 25: 127.7  GEN: no acute distress. Overweight, looks well  HEENT: Atraumatic, Normocephalic.  Bilateral TM wnl, oropharynx normal.  PEERL,EOMI.   Ears and Nose: No external deformity. CV: RRR, No M/G/R. No JVD. No thrill. No extra heart sounds. PULM: CTA B, no wheezes, crackles, rhonchi. No retractions. No resp. distress. No accessory muscle use. ABD: S, NT, ND, +BS. No rebound. No HSM. EXTR: No c/c/e PSYCH: Normally interactive. Conversant.    Assessment and Plan: Hyperlipidemia, unspecified hyperlipidemia type - Plan: Lipid panel  Fatigue, unspecified type - Plan: CBC, TSH  Hypothyroidism due to acquired atrophy of thyroid - Plan: TSH, levothyroxine (SYNTHROID) 88 MCG tablet  Essential hypertension - Plan: CBC, Comprehensive metabolic panel, losartan (COZAAR) 100 MG tablet, metoprolol tartrate (LOPRESSOR) 50 MG tablet  Osteopenia, unspecified location  Prediabetes - Plan: Hemoglobin A1c  BP under good control- continue current medications Check TSH today Encourage exercise Will plan further follow- up pending labs.  Pt prefers labs mailed to her  Signed Abbe Amsterdam, MD  Received labs as below, called pt but did not reach.  Will try again later Need to adjust her thyroid dose   Addnd 1/25- called again- have tried a few times without success Left message on machine TSH is elevated- will adjust levothyroxine from 88 - 100 mcg Please do TSH only in about 6 weeks- will order   Results for orders placed or performed in visit on 06/03/21  CBC  Result Value Ref Range   WBC 8.7 4.0 - 10.5 K/uL   RBC 4.19 3.87 - 5.11 Mil/uL   Platelets 326.0 150.0 - 400.0 K/uL   Hemoglobin 12.6 12.0 - 15.0  g/dL   HCT 48.2 50.0 - 37.0 %   MCV 92.7 78.0 - 100.0 fl   MCHC 32.5 30.0 - 36.0 g/dL   RDW 48.8 89.1 - 69.4 %  Comprehensive metabolic panel  Result Value Ref Range   Sodium 138 135 - 145 mEq/L   Potassium 4.4 3.5 - 5.1 mEq/L   Chloride 100 96 - 112 mEq/L   CO2 29 19 - 32 mEq/L   Glucose, Bld 78 70 - 99 mg/dL   BUN 12 6 - 23 mg/dL   Creatinine, Ser 5.03 0.40 - 1.20 mg/dL   Total Bilirubin 0.3 0.2 - 1.2 mg/dL   Alkaline Phosphatase 68 39 - 117 U/L   AST 17 0 - 37 U/L   ALT 17  0 - 35 U/L   Total Protein 6.8 6.0 - 8.3 g/dL   Albumin 4.2 3.5 - 5.2 g/dL   GFR 16.1081.34 >96.04>60.00 mL/min   Calcium 9.9 8.4 - 10.5 mg/dL  Hemoglobin V4UA1c  Result Value Ref Range   Hgb A1c MFr Bld 6.1 4.6 - 6.5 %  Lipid panel  Result Value Ref Range   Cholesterol 214 (H) 0 - 200 mg/dL   Triglycerides 981.1244.0 (H) 0.0 - 149.0 mg/dL   HDL 91.4750.70 >82.95>39.00 mg/dL   VLDL 62.148.8 (H) 0.0 - 30.840.0 mg/dL   Total CHOL/HDL Ratio 4    NonHDL 162.93   TSH  Result Value Ref Range   TSH 6.82 (H) 0.35 - 5.50 uIU/mL  LDL cholesterol, direct  Result Value Ref Range   Direct LDL 131.0 mg/dL

## 2021-05-26 NOTE — Patient Instructions (Addendum)
It was great to see you again today, I will be in touch with the results of soon as possible Assuming all is well please see me in 6 to 9 months  Keep up the good work with exercise- try to get at least 5k steps daily  Recommend the latest covid booster at your convenience

## 2021-06-03 ENCOUNTER — Ambulatory Visit (INDEPENDENT_AMBULATORY_CARE_PROVIDER_SITE_OTHER): Payer: Medicare HMO | Admitting: Family Medicine

## 2021-06-03 VITALS — BP 122/72 | HR 84 | Temp 97.7°F | Resp 18 | Ht 60.0 in | Wt 151.2 lb

## 2021-06-03 DIAGNOSIS — E785 Hyperlipidemia, unspecified: Secondary | ICD-10-CM | POA: Diagnosis not present

## 2021-06-03 DIAGNOSIS — R7303 Prediabetes: Secondary | ICD-10-CM | POA: Diagnosis not present

## 2021-06-03 DIAGNOSIS — E034 Atrophy of thyroid (acquired): Secondary | ICD-10-CM

## 2021-06-03 DIAGNOSIS — M8580 Other specified disorders of bone density and structure, unspecified site: Secondary | ICD-10-CM

## 2021-06-03 DIAGNOSIS — E1169 Type 2 diabetes mellitus with other specified complication: Secondary | ICD-10-CM | POA: Diagnosis not present

## 2021-06-03 DIAGNOSIS — R5383 Other fatigue: Secondary | ICD-10-CM | POA: Diagnosis not present

## 2021-06-03 DIAGNOSIS — I10 Essential (primary) hypertension: Secondary | ICD-10-CM

## 2021-06-03 LAB — CBC
HCT: 38.9 % (ref 36.0–46.0)
Hemoglobin: 12.6 g/dL (ref 12.0–15.0)
MCHC: 32.5 g/dL (ref 30.0–36.0)
MCV: 92.7 fl (ref 78.0–100.0)
Platelets: 326 10*3/uL (ref 150.0–400.0)
RBC: 4.19 Mil/uL (ref 3.87–5.11)
RDW: 13.9 % (ref 11.5–15.5)
WBC: 8.7 10*3/uL (ref 4.0–10.5)

## 2021-06-03 LAB — LIPID PANEL
Cholesterol: 214 mg/dL — ABNORMAL HIGH (ref 0–200)
HDL: 50.7 mg/dL (ref >39.00)
NonHDL: 162.93
Total CHOL/HDL Ratio: 4
Triglycerides: 244 mg/dL — ABNORMAL HIGH (ref 0.0–149.0)
VLDL: 48.8 mg/dL — ABNORMAL HIGH (ref 0.0–40.0)

## 2021-06-03 LAB — COMPREHENSIVE METABOLIC PANEL
ALT: 17 U/L (ref 0–35)
AST: 17 U/L (ref 0–37)
Albumin: 4.2 g/dL (ref 3.5–5.2)
Alkaline Phosphatase: 68 U/L (ref 39–117)
BUN: 12 mg/dL (ref 6–23)
CO2: 29 mEq/L (ref 19–32)
Calcium: 9.9 mg/dL (ref 8.4–10.5)
Chloride: 100 mEq/L (ref 96–112)
Creatinine, Ser: 0.73 mg/dL (ref 0.40–1.20)
GFR: 81.34 mL/min (ref >60.00)
Glucose, Bld: 78 mg/dL (ref 70–99)
Potassium: 4.4 mEq/L (ref 3.5–5.1)
Sodium: 138 mEq/L (ref 135–145)
Total Bilirubin: 0.3 mg/dL (ref 0.2–1.2)
Total Protein: 6.8 g/dL (ref 6.0–8.3)

## 2021-06-03 LAB — TSH: TSH: 6.82 u[IU]/mL — ABNORMAL HIGH (ref 0.35–5.50)

## 2021-06-03 LAB — HEMOGLOBIN A1C: Hgb A1c MFr Bld: 6.1 % (ref 4.6–6.5)

## 2021-06-03 LAB — LDL CHOLESTEROL, DIRECT: Direct LDL: 131 mg/dL

## 2021-06-03 MED ORDER — LOSARTAN POTASSIUM 100 MG PO TABS: ORAL_TABLET | ORAL | 3 refills | Status: DC

## 2021-06-03 MED ORDER — LEVOTHYROXINE SODIUM 88 MCG PO TABS: 88.0000 ug | ORAL_TABLET | Freq: Every day | ORAL | 3 refills | Status: DC

## 2021-06-03 MED ORDER — METOPROLOL TARTRATE 50 MG PO TABS: 50.0000 mg | ORAL_TABLET | Freq: Two times a day (BID) | ORAL | 3 refills | Status: DC

## 2021-06-05 MED ORDER — LEVOTHYROXINE SODIUM 100 MCG PO TABS: 100.0000 ug | ORAL_TABLET | Freq: Every day | ORAL | 3 refills | Status: DC

## 2021-06-05 NOTE — Addendum Note (Signed)
Addended by: Abbe Amsterdam C on: 06/05/2021 12:46 PM   Modules accepted: Orders

## 2021-06-17 ENCOUNTER — Telehealth: Payer: Self-pay | Admitting: Family Medicine

## 2021-06-17 NOTE — Telephone Encounter (Signed)
Placed in mail per pt request

## 2021-06-17 NOTE — Telephone Encounter (Signed)
Patient is requesting lab results 1/23 be mailed to her residence

## 2021-06-25 ENCOUNTER — Telehealth: Payer: Self-pay | Admitting: Family Medicine

## 2021-06-25 DIAGNOSIS — E785 Hyperlipidemia, unspecified: Secondary | ICD-10-CM

## 2021-06-25 MED ORDER — SIMVASTATIN 20 MG PO TABS: 20.0000 mg | ORAL_TABLET | Freq: Every day | ORAL | 3 refills | Status: DC

## 2021-06-25 NOTE — Telephone Encounter (Signed)
Pt is okay with starting statin.  

## 2021-06-25 NOTE — Telephone Encounter (Signed)
Send in prescription for simvastatin

## 2021-06-25 NOTE — Telephone Encounter (Signed)
Pt stated per labs, Dr.Copland wanted her to start on cholesterol medication. She stated this can be sent to [facility].   [facility] - [place], [place] - [address]  [address] [place] [address]  Phone:  [phone]  Fax:  [phone]

## 2021-07-13 ENCOUNTER — Other Ambulatory Visit: Payer: Self-pay | Admitting: Family Medicine

## 2021-07-13 DIAGNOSIS — J301 Allergic rhinitis due to pollen: Secondary | ICD-10-CM

## 2021-07-15 ENCOUNTER — Other Ambulatory Visit (INDEPENDENT_AMBULATORY_CARE_PROVIDER_SITE_OTHER): Payer: Medicare HMO

## 2021-07-15 ENCOUNTER — Encounter: Payer: Self-pay | Admitting: Family Medicine

## 2021-07-15 DIAGNOSIS — E034 Atrophy of thyroid (acquired): Secondary | ICD-10-CM | POA: Diagnosis not present

## 2021-07-15 LAB — TSH: TSH: 1.28 u[IU]/mL (ref 0.35–5.50)

## 2021-09-14 ENCOUNTER — Other Ambulatory Visit: Payer: Self-pay | Admitting: Family Medicine

## 2021-09-14 DIAGNOSIS — I10 Essential (primary) hypertension: Secondary | ICD-10-CM

## 2021-10-17 ENCOUNTER — Other Ambulatory Visit: Payer: Self-pay

## 2021-10-17 ENCOUNTER — Telehealth: Payer: Self-pay | Admitting: Family Medicine

## 2021-10-17 DIAGNOSIS — Z1211 Encounter for screening for malignant neoplasm of colon: Secondary | ICD-10-CM

## 2021-10-17 NOTE — Telephone Encounter (Signed)
Patient states it is time to get a colorguard and would like to order one, please advise.

## 2021-10-17 NOTE — Telephone Encounter (Signed)
Order has been placed.

## 2021-10-22 DIAGNOSIS — N751 Abscess of Bartholin's gland: Secondary | ICD-10-CM | POA: Diagnosis not present

## 2021-10-28 DIAGNOSIS — Z1211 Encounter for screening for malignant neoplasm of colon: Secondary | ICD-10-CM | POA: Diagnosis not present

## 2021-10-29 DIAGNOSIS — Z0100 Encounter for examination of eyes and vision without abnormal findings: Secondary | ICD-10-CM | POA: Diagnosis not present

## 2021-10-29 DIAGNOSIS — I10 Essential (primary) hypertension: Secondary | ICD-10-CM | POA: Diagnosis not present

## 2021-10-29 DIAGNOSIS — H5210 Myopia, unspecified eye: Secondary | ICD-10-CM | POA: Diagnosis not present

## 2021-10-31 DIAGNOSIS — N751 Abscess of Bartholin's gland: Secondary | ICD-10-CM | POA: Diagnosis not present

## 2021-11-04 ENCOUNTER — Telehealth: Payer: Self-pay | Admitting: Family Medicine

## 2021-11-05 NOTE — Telephone Encounter (Signed)
Updated.

## 2021-11-12 ENCOUNTER — Encounter: Payer: Self-pay | Admitting: Family Medicine

## 2021-11-12 LAB — COLOGUARD: COLOGUARD: NEGATIVE

## 2021-11-19 ENCOUNTER — Ambulatory Visit (INDEPENDENT_AMBULATORY_CARE_PROVIDER_SITE_OTHER): Payer: Medicare HMO | Admitting: Family

## 2021-11-19 VITALS — BP 124/70 | HR 89 | Temp 97.7°F | Resp 16 | Ht 60.0 in | Wt 151.0 lb

## 2021-11-19 DIAGNOSIS — J019 Acute sinusitis, unspecified: Secondary | ICD-10-CM

## 2021-11-19 MED ORDER — PREDNISONE 20 MG PO TABS: 20.0000 mg | ORAL_TABLET | Freq: Every day | ORAL | 0 refills | Status: DC

## 2021-11-19 MED ORDER — DOXYCYCLINE HYCLATE 100 MG PO TABS: 100.0000 mg | ORAL_TABLET | Freq: Two times a day (BID) | ORAL | 0 refills | Status: DC

## 2021-11-19 MED ORDER — PROMETHAZINE-DM 6.25-15 MG/5ML PO SYRP: 5.0000 mL | ORAL_SOLUTION | Freq: Four times a day (QID) | ORAL | 0 refills | Status: DC | PRN

## 2021-11-19 NOTE — Progress Notes (Signed)
Alicia Wallace is a 76 y.o. female with the following history as recorded in EpicCare:  Patient Active Problem List   Diagnosis Date Noted   Prediabetes 06/03/2021   Other and unspecified hyperlipidemia 08/22/2013   HTN (hypertension) 04/06/2012   Osteopenia 04/06/2012   Seasonal and perennial allergic rhinitis 07/15/2010   HYPOTHYROIDISM 05/30/2008   Asthma, mild intermittent 07/14/2007    Current Outpatient Medications  Medication Sig Dispense Refill   Calcium Citrate-Vitamin D (CITRUS CALCIUM 1500 + D PO) Take 1 tablet by mouth daily.      Cholecalciferol (VITAMIN D3) 2000 UNITS TABS Take 2,000 Units by mouth daily.      doxycycline (VIBRA-TABS) 100 MG tablet Take 1 tablet (100 mg total) by mouth 2 (two) times daily. 14 tablet 0   estradiol (CLIMARA - DOSED IN MG/24 HR) 0.025 mg/24hr patch APPLY 1 PATCH ONCE WEEKLY  2   levocetirizine (XYZAL) 5 MG tablet TAKE 1 TABLET EVERY DAY IN THE EVENING (NEED MD APPOINTMENT) 90 tablet 3   levothyroxine (SYNTHROID) 100 MCG tablet Take 1 tablet (100 mcg total) by mouth daily. 90 tablet 3   losartan (COZAAR) 100 MG tablet Take one tablet by mouth daily for blood pressure 90 tablet 3   metoprolol tartrate (LOPRESSOR) 50 MG tablet Take 1 tablet (50 mg total) by mouth 2 (two) times daily. 180 tablet 3   nortriptyline (PAMELOR) 10 MG capsule Take 2 at bedtime as needed 180 capsule 3   predniSONE (DELTASONE) 20 MG tablet Take 1 tablet (20 mg total) by mouth daily with breakfast. 5 tablet 0   promethazine-dextromethorphan (PROMETHAZINE-DM) 6.25-15 MG/5ML syrup Take 5 mLs by mouth 4 (four) times daily as needed for cough. 118 mL 0   simvastatin (ZOCOR) 20 MG tablet Take 1 tablet (20 mg total) by mouth at bedtime. 90 tablet 3   zolpidem (AMBIEN) 10 MG tablet Take 1/2 tablet at bedtime as needed for sleep. May take 1 tablet if necessary 30 tablet 5   No current facility-administered medications for this visit.    Allergies: Influenza vac split quad,  Penicillins, and Sulfonamide derivatives  Past Medical History:  Diagnosis Date   Allergic rhinitis    Asthma    Hypertension    Hypothyroid     Past Surgical History:  Procedure Laterality Date   APPENDECTOMY     CHOLECYSTECTOMY     TONSILLECTOMY     TOTAL ABDOMINAL HYSTERECTOMY W/ BILATERAL SALPINGOOPHORECTOMY      Family History  Problem Relation Age of Onset   Kidney disease Mother    Heart disease Father    Cancer Father        bladder   Asthma Father    Allergies Father     Social History   Tobacco Use   Smoking status: Never   Smokeless tobacco: Never  Substance Use Topics   Alcohol use: No    Alcohol/week: 0.0 standard drinks of alcohol    Subjective:   Presents with concerns for sinus infection; symptoms x 2 weeks; +fatigue; does feel some shortness of breath; no OTC medications;     Objective:  Vitals:   11/19/21 1305  BP: 124/70  Pulse: 89  Resp: 16  Temp: 97.7 F (36.5 C)  TempSrc: Oral  SpO2: 96%  Weight: 151 lb (68.5 kg)  Height: 5' (1.524 m)    General: Well developed, well nourished, in no acute distress  Skin : Warm and dry.  Head: Normocephalic and atraumatic  Eyes: Sclera and conjunctiva  clear; pupils round and reactive to light; extraocular movements intact  Ears: External normal; canals clear; tympanic membranes normal  Oropharynx: Pink, supple. No suspicious lesions  Neck: Supple without thyromegaly, adenopathy  Lungs: Respirations unlabored; clear to auscultation bilaterally without wheeze, rales, rhonchi  CVS exam: normal rate and regular rhythm.  Neurologic: Alert and oriented; speech intact; face symmetrical; moves all extremities well; CNII-XII intact without focal deficit   Assessment:  1. Acute sinusitis, recurrence not specified, unspecified location     Plan:  Rx for Doxycycline, Prednisone and Promethazine DM; increase fluids, rest and follow up worse, no better.   No follow-ups on file.  No orders of the defined  types were placed in this encounter.   Requested Prescriptions   Signed Prescriptions Disp Refills   doxycycline (VIBRA-TABS) 100 MG tablet 14 tablet 0    Sig: Take 1 tablet (100 mg total) by mouth 2 (two) times daily.   predniSONE (DELTASONE) 20 MG tablet 5 tablet 0    Sig: Take 1 tablet (20 mg total) by mouth daily with breakfast.   promethazine-dextromethorphan (PROMETHAZINE-DM) 6.25-15 MG/5ML syrup 118 mL 0    Sig: Take 5 mLs by mouth 4 (four) times daily as needed for cough.

## 2021-12-16 NOTE — Progress Notes (Addendum)
Subjective:   Alicia Wallace is a 76 y.o. female who presents for Medicare Annual (Subsequent) preventive examination.  I connected with  [name] on 12/17/21 by a audio enabled telemedicine application and verified that I am speaking with the correct person using two identifiers.  Patient Location: Home  Provider Location: Office/Clinic  I discussed the limitations of evaluation and management by telemedicine. The patient expressed understanding and agreed to proceed.   Review of Systems     Cardiac Risk Factors include: advanced age (>37men, >25 women);hypertension;dyslipidemia     Objective:    There were no vitals filed for this visit. There is no height or weight on file to calculate BMI.     12/17/2021    8:26 AM 12/10/2020    9:42 AM 11/16/2019    8:05 AM 11/09/2015   11:10 AM  Advanced Directives  Does Patient Have a Medical Advance Directive? No No Yes No  Type of [place] boy of [place];Living will   Does patient want to make changes to medical advance directive?   No - Patient declined   Copy of Healthcare Power of Attorney in Chart?   No - copy requested   Would patient like information on creating a medical advance directive? No - Patient declined No - Patient declined      Current Medications (verified) Outpatient Encounter Medications as of 12/17/2021  Medication Sig   Calcium Citrate-Vitamin D (CITRUS CALCIUM 1500 + D PO) Take 1 tablet by mouth daily.    Cholecalciferol (VITAMIN D3) 2000 UNITS TABS Take 2,000 Units by mouth daily.    estradiol (CLIMARA - DOSED IN MG/24 HR) 0.025 mg/24hr patch APPLY 1 PATCH ONCE WEEKLY   levocetirizine (XYZAL) 5 MG tablet TAKE 1 TABLET EVERY DAY IN THE EVENING (NEED MD APPOINTMENT)   levothyroxine (SYNTHROID) 100 MCG tablet Take 1 tablet (100 mcg total) by mouth daily.   losartan (COZAAR) 100 MG tablet Take one tablet by mouth daily for blood pressure   metoprolol tartrate (LOPRESSOR) 50 MG tablet  Take 1 tablet (50 mg total) by mouth 2 (two) times daily.   nortriptyline (PAMELOR) 10 MG capsule Take 2 at bedtime as needed   simvastatin (ZOCOR) 20 MG tablet Take 1 tablet (20 mg total) by mouth at bedtime.   zolpidem (AMBIEN) 10 MG tablet Take 1/2 tablet at bedtime as needed for sleep. May take 1 tablet if necessary   [DISCONTINUED] doxycycline (VIBRA-TABS) 100 MG tablet Take 1 tablet (100 mg total) by mouth 2 (two) times daily.   [DISCONTINUED] predniSONE (DELTASONE) 20 MG tablet Take 1 tablet (20 mg total) by mouth daily with breakfast.   [DISCONTINUED] promethazine-dextromethorphan (PROMETHAZINE-DM) 6.25-15 MG/5ML syrup Take 5 mLs by mouth 4 (four) times daily as needed for cough.   No facility-administered encounter medications on file as of 12/17/2021.    Allergies (verified) Influenza vac split quad, Penicillins, and Sulfonamide derivatives   History: Past Medical History:  Diagnosis Date   Allergic rhinitis    Asthma    Hypertension    Hypothyroid    Past Surgical History:  Procedure Laterality Date   APPENDECTOMY     CHOLECYSTECTOMY     TONSILLECTOMY     TOTAL ABDOMINAL HYSTERECTOMY W/ BILATERAL SALPINGOOPHORECTOMY     Family History  Problem Relation Age of Onset   Kidney disease Mother    Heart disease Father    Cancer Father        bladder   Asthma  Father    Allergies Father    Social History   Socioeconomic History   Marital status: Divorced    Spouse name: Not on file   Number of children: 0   Years of education: Not on file   Highest education level: Not on file  Occupational History   Not on file  Tobacco Use   Smoking status: Never   Smokeless tobacco: Never  Substance and Sexual Activity   Alcohol use: No    Alcohol/week: 0.0 standard drinks of alcohol   Drug use: No   Sexual activity: Not on file  Other Topics Concern   Not on file  Social History Narrative   States exercises 3x week   Drinks 1/2 cup caffeine a day   separated    Social Determinants of Health   Financial Resource Strain: Low Risk  (12/10/2020)   Overall Financial Resource Strain (CARDIA)    Difficulty of Paying Living Expenses: Not hard at all  Food Insecurity: No Food Insecurity (12/10/2020)   Hunger Vital Sign    Worried About Running Out of Food in the Last Year: Never true    Ran Out of Food in the Last Year: Never true  Transportation Needs: No Transportation Needs (12/10/2020)   PRAPARE - Administrator, Civil Service (Medical): No    Lack of Transportation (Non-Medical): No  Physical Activity: Insufficiently Active (12/10/2020)   Exercise Vital Sign    Days of Exercise per Week: 3 days    Minutes of Exercise per Session: 20 min  Stress: No Stress Concern Present (12/10/2020)   [employer] of Occupational Health - Occupational Stress Questionnaire    Feeling of Stress : Not at all  Social Connections: Moderately Isolated (12/10/2020)   Social Connection and Isolation Panel [NHANES]    Frequency of Communication with Friends and Family: More than three times a week    Frequency of Social Gatherings with Friends and Family: More than three times a week    Attends Religious Services: More than 4 times per year    Active Member of [employer] or Organizations: No    Attends Engineer, structural: Never    Marital Status: Divorced    Tobacco Counseling Counseling given: Not Answered   Clinical Intake:  Pre-visit preparation completed: Yes  Pain : No/denies pain     BMI - recorded: 29.49 Nutritional Status: BMI 25 -29 Overweight Nutritional Risks: None Diabetes: No  How often do you need to have someone help you when you read instructions, pamphlets, or other written materials from your doctor or pharmacy?: 1 - Never  Diabetic?no  Interpreter Needed?: No  Information entered by :: Aanchal Cope   Activities of Daily Living    12/17/2021    8:28 AM  In your present state of health, do you have any  difficulty performing the following activities:  Hearing? 0  Vision? 0  Difficulty concentrating or making decisions? 0  Walking or climbing stairs? 0  Dressing or bathing? 0  Doing errands, shopping? 0  Preparing Food and eating ? N  Using the Toilet? N  In the past six months, have you accidently leaked urine? N  Do you have problems with loss of bowel control? N  Managing your Medications? N  Managing your Finances? N  Housekeeping or managing your Housekeeping? N    Patient Care Team: Copland, Gwenlyn Found, MD as PCP - General (Family Medicine) Lynden Ang, NP as Nurse Practitioner (Obstetrics and Gynecology)  Indicate any recent Medical Services you may have received from other than [facility] providers in the past year (date may be approximate).     Assessment:   This is a routine wellness examination for Ryleigh.  Hearing/Vision screen No results found.  Dietary issues and exercise activities discussed: Current Exercise Habits: Home exercise routine, Type of exercise: walking, Time (Minutes): 20, Frequency (Times/Week): 7, Weekly Exercise (Minutes/Week): 140, Intensity: Mild, Exercise limited by: None identified   Goals Addressed             This Visit's Progress    Maintain healthy active lifestyle.   On track      Depression Screen    12/17/2021    8:27 AM 11/19/2021    1:08 PM 12/10/2020    9:44 AM 11/16/2019    8:12 AM 08/27/2016    8:32 AM 06/27/2015    8:02 AM 01/18/2015    9:02 AM  PHQ 2/9 Scores  PHQ - 2 Score 0 0 0 0 0 0 0  Exception Documentation      Patient refusal     Fall Risk    12/17/2021    8:27 AM 11/19/2021    1:08 PM 12/10/2020    9:43 AM 11/16/2019    8:08 AM 08/27/2016    8:32 AM  Fall Risk   Falls in the past year? 0 0 0 0 No  Number falls in past yr: 0 0 0 0   Injury with Fall? 0 0 0 0   Risk for fall due to : No Fall Risks      Follow up Falls evaluation completed  Falls prevention discussed Education provided;Falls prevention discussed      FALL RISK PREVENTION PERTAINING TO THE HOME:  Any stairs in or around the home? No  If so, are there any without handrails?  N/a Home free of loose throw rugs in walkways, pet beds, electrical cords, etc? Yes  Adequate lighting in your home to reduce risk of falls? Yes   ASSISTIVE DEVICES UTILIZED TO PREVENT FALLS:  Life alert? No  Use of a cane, [place] or w/c? No  Grab bars in the bathroom? Yes  Shower chair or bench in shower? No  Elevated toilet seat or a handicapped toilet? No   TIMED UP AND GO:  Was the test performed? No .    Cognitive Function:        12/17/2021    8:31 AM  6CIT Screen  What Year? 0 points  What month? 0 points  What time? 0 points  Count back from 20 0 points  Months in reverse 0 points  Repeat phrase 0 points  Total Score 0 points    Immunizations Immunization History  Administered Date(s) Administered   Fluad Quad(high Dose 65+) 01/11/2019   Influenza Split 02/10/2011, 03/03/2012, 01/11/2014, 12/28/2015   Influenza Whole 01/31/2008, 02/28/2010   Influenza, High Dose Seasonal PF 12/27/2014, 01/04/2017, 01/21/2018   Influenza,inj,Quad PF,6+ Mos 01/17/2013   Influenza-Unspecified 01/04/2017, 01/21/2018   PFIZER Comirnaty(Gray Top)Covid-19 Tri-Sucrose Vaccine 09/05/2020   PFIZER(Purple Top)SARS-COV-2 Vaccination 08/04/2019, 08/25/2019   Pfizer Covid-19 Vaccine Bivalent Booster 5y-11y 01/10/2021   Pneumococcal Conjugate-13 12/22/2014, 11/29/2018   Pneumococcal Polysaccharide-23 02/17/2013, 09/22/2017   Tdap 07/10/2008, 07/31/2018   Zoster Recombinat (Shingrix) 09/22/2017, 11/22/2017    TDAP status: Up to date  Flu Vaccine status: Due, Education has been provided regarding the importance of this vaccine. Advised may receive this vaccine at local pharmacy or Health Dept. Aware to provide a copy  of the vaccination record if obtained from local pharmacy or Health Dept. Verbalized acceptance and understanding.  Pneumococcal vaccine  status: Up to date  Covid-19 vaccine status: Completed vaccines  Qualifies for Shingles Vaccine? Yes   Zostavax completed No   Shingrix Completed?: Yes  Screening Tests Health Maintenance  Topic Date Due   MAMMOGRAM  01/19/2022   Fecal DNA (Cologuard)  10/28/2024   TETANUS/TDAP  07/30/2028   Pneumonia Vaccine 73+ Years old  Completed   DEXA SCAN  Completed   Hepatitis C Screening  Completed   Zoster Vaccines- Shingrix  Completed   HPV VACCINES  Aged Out   COVID-19 Vaccine  Discontinued    Health Maintenance  There are no preventive care reminders to display for this patient.  Colorectal cancer screening: Type of screening: Cologuard. Completed 10/28/21. Repeat every 3 years  Mammogram status: Completed 01/19/21. Repeat every year  Bone Density status: Completed 05/25/20. Results reflect: Bone density results: OSTEOPENIA. Repeat every 2 years.  Lung Cancer Screening: (Low Dose CT Chest recommended if Age 89-80 years, 30 pack-year currently smoking OR have quit w/in 15years.) does not qualify.   Lung Cancer Screening Referral: n/a  Additional Screening:  Hepatitis C Screening: does qualify; Completed 08/27/16  Vision Screening: Recommended annual ophthalmology exams for early detection of glaucoma and other disorders of the eye. Is the patient up to date with their annual eye exam?  Yes  Who is the provider or what is the name of the office in which the patient attends annual eye exams? [facility]  If pt is not established with a provider, would they like to be referred to a provider to establish care? No .   Dental Screening: Recommended annual dental exams for proper oral hygiene  Community Resource Referral / Chronic Care Management: CRR required this visit?  No   CCM required this visit?  No      Plan:     I have personally reviewed and noted the following in the patient's chart:   Medical and social history Use of alcohol, tobacco or illicit drugs  Current  medications and supplements including opioid prescriptions.  Functional ability and status Nutritional status Physical activity Advanced directives List of other physicians Hospitalizations, surgeries, and ER visits in previous 12 months Vitals Screenings to include cognitive, depression, and falls Referrals and appointments  In addition, I have reviewed and discussed with patient certain preventive protocols, quality metrics, and best practice recommendations. A written personalized care plan for preventive services as well as general preventive health recommendations were provided to patient.   Due to this being a telephonic visit, the after visit summary with patients personalized plan was offered to patient via mail or my-chart.  Patient would like to access on my-chart.   Salomon Mast Azaela Caracci, CMA   12/17/2021   Nurse Notes: none

## 2021-12-17 ENCOUNTER — Ambulatory Visit (INDEPENDENT_AMBULATORY_CARE_PROVIDER_SITE_OTHER): Payer: Medicare HMO

## 2021-12-17 DIAGNOSIS — Z0000 Encounter for general adult medical examination without abnormal findings: Secondary | ICD-10-CM

## 2021-12-17 NOTE — Patient Instructions (Signed)
Alicia Wallace , Thank you for taking time to come for your Medicare Wellness Visit. I appreciate your ongoing commitment to your health goals. Please review the following plan we discussed and let me know if I can assist you in the future.   Screening recommendations/referrals: Colonoscopy: 10/28/21 due 10/29/22 Mammogram: 01/19/21 due 01/19/22 Bone Density: 05/25/20 due 05/25/22 Recommended yearly ophthalmology/optometry visit for glaucoma screening and checkup Recommended yearly dental visit for hygiene and checkup  Vaccinations: Influenza vaccine: Due-May obtain vaccine at our office or your local pharmacy.  Pneumococcal vaccine: up to date Tdap vaccine: up to date Shingles vaccine: up to date   Covid-19:completed  Advanced directives: no, packet sent   Conditions/risks identified: see problem list  Next appointment: Follow up in one year for your annual wellness visit    Preventive Care 65 Years and Older, Female Preventive care refers to lifestyle choices and visits with your health care provider that can promote health and wellness. What does preventive care include? A yearly physical exam. This is also called an annual well check. Dental exams once or twice a year. Routine eye exams. Ask your health care provider how often you should have your eyes checked. Personal lifestyle choices, including: Daily care of your teeth and gums. Regular physical activity. Eating a healthy diet. Avoiding tobacco and drug use. Limiting alcohol use. Practicing safe sex. Taking low-dose aspirin every day. Taking vitamin and mineral supplements as recommended by your health care provider. What happens during an annual well check? The services and screenings done by your health care provider during your annual well check will depend on your age, overall health, lifestyle risk factors, and family history of disease. Counseling  Your health care provider may ask you questions about your: Alcohol  use. Tobacco use. Drug use. Emotional well-being. Home and relationship well-being. Sexual activity. Eating habits. History of falls. Memory and ability to understand (cognition). Work and work Astronomer. Reproductive health. Screening  You may have the following tests or measurements: Height, weight, and BMI. Blood pressure. Lipid and cholesterol levels. These may be checked every 5 years, or more frequently if you are over 69 years old. Skin check. Lung cancer screening. You may have this screening every year starting at age 42 if you have a 30-pack-year history of smoking and currently smoke or have quit within the past 15 years. Fecal occult blood test (FOBT) of the stool. You may have this test every year starting at age 77. Flexible sigmoidoscopy or colonoscopy. You may have a sigmoidoscopy every 5 years or a colonoscopy every 10 years starting at age 36. Hepatitis C blood test. Hepatitis B blood test. Sexually transmitted disease (STD) testing. Diabetes screening. This is done by checking your blood sugar (glucose) after you have not eaten for a while (fasting). You may have this done every 1-3 years. Bone density scan. This is done to screen for osteoporosis. You may have this done starting at age 44. Mammogram. This may be done every 1-2 years. Talk to your health care provider about how often you should have regular mammograms. Talk with your health care provider about your test results, treatment options, and if necessary, the need for more tests. Vaccines  Your health care provider may recommend certain vaccines, such as: Influenza vaccine. This is recommended every year. Tetanus, diphtheria, and acellular pertussis (Tdap, Td) vaccine. You may need a Td booster every 10 years. Zoster vaccine. You may need this after age 40. Pneumococcal 13-valent conjugate (PCV13) vaccine. One dose is  recommended after age 66. Pneumococcal polysaccharide (PPSV23) vaccine. One dose is  recommended after age 78. Talk to your health care provider about which screenings and vaccines you need and how often you need them. This information is not intended to replace advice given to you by your health care provider. Make sure you discuss any questions you have with your health care provider. Document Released: 05/25/2015 Document Revised: 01/16/2016 Document Reviewed: 02/27/2015 Elsevier Interactive Patient Education  2017 [place] Prevention in the Home Falls can cause injuries. They can happen to people of all ages. There are many things you can do to make your home safe and to help prevent falls. What can I do on the outside of my home? Regularly fix the edges of walkways and driveways and fix any cracks. Remove anything that might make you trip as you walk through a door, such as a raised step or threshold. Trim any bushes or trees on the path to your home. Use bright outdoor lighting. Clear any walking paths of anything that might make someone trip, such as rocks or tools. Regularly check to see if handrails are loose or broken. Make sure that both sides of any steps have handrails. Any raised decks and porches should have guardrails on the edges. Have any leaves, snow, or ice cleared regularly. Use sand or salt on walking paths during winter. Clean up any spills in your garage right away. This includes oil or grease spills. What can I do in the bathroom? Use night lights. Install grab bars by the toilet and in the tub and shower. Do not use towel bars as grab bars. Use non-skid mats or decals in the tub or shower. If you need to sit down in the shower, use a plastic, non-slip stool. Keep the floor dry. Clean up any water that spills on the floor as soon as it happens. Remove soap buildup in the tub or shower regularly. Attach bath mats securely with double-sided non-slip rug tape. Do not have throw rugs and other things on the floor that can make you  trip. What can I do in the bedroom? Use night lights. Make sure that you have a light by your bed that is easy to reach. Do not use any sheets or blankets that are too big for your bed. They should not hang down onto the floor. Have a firm chair that has side arms. You can use this for support while you get dressed. Do not have throw rugs and other things on the floor that can make you trip. What can I do in the kitchen? Clean up any spills right away. Avoid walking on wet floors. Keep items that you use a lot in easy-to-reach places. If you need to reach something above you, use a strong step stool that has a grab bar. Keep electrical cords out of the way. Do not use floor polish or wax that makes floors slippery. If you must use wax, use non-skid floor wax. Do not have throw rugs and other things on the floor that can make you trip. What can I do with my stairs? Do not leave any items on the stairs. Make sure that there are handrails on both sides of the stairs and use them. Fix handrails that are broken or loose. Make sure that handrails are as long as the stairways. Check any carpeting to make sure that it is firmly attached to the stairs. Fix any carpet that is loose or worn. Avoid having  throw rugs at the top or bottom of the stairs. If you do have throw rugs, attach them to the floor with carpet tape. Make sure that you have a light switch at the top of the stairs and the bottom of the stairs. If you do not have them, ask someone to add them for you. What else can I do to help prevent falls? Wear shoes that: Do not have high heels. Have rubber bottoms. Are comfortable and fit you well. Are closed at the toe. Do not wear sandals. If you use a stepladder: Make sure that it is fully opened. Do not climb a closed stepladder. Make sure that both sides of the stepladder are locked into place. Ask someone to hold it for you, if possible. Clearly mark and make sure that you can  see: Any grab bars or handrails. First and last steps. Where the edge of each step is. Use tools that help you move around (mobility aids) if they are needed. These include: Canes. Walkers. Scooters. Crutches. Turn on the lights when you go into a dark area. Replace any light bulbs as soon as they burn out. Set up your furniture so you have a clear path. Avoid moving your furniture around. If any of your floors are uneven, fix them. If there are any pets around you, be aware of where they are. Review your medicines with your doctor. Some medicines can make you feel dizzy. This can increase your chance of falling. Ask your doctor what other things that you can do to help prevent falls. This information is not intended to replace advice given to you by your health care provider. Make sure you discuss any questions you have with your health care provider. Document Released: 02/22/2009 Document Revised: 10/04/2015 Document Reviewed: 06/02/2014 Elsevier Interactive Patient Education  2017 [employer].

## 2022-01-03 ENCOUNTER — Other Ambulatory Visit: Payer: Self-pay | Admitting: Family Medicine

## 2022-01-03 DIAGNOSIS — I10 Essential (primary) hypertension: Secondary | ICD-10-CM

## 2022-02-12 DIAGNOSIS — F341 Dysthymic disorder: Secondary | ICD-10-CM | POA: Diagnosis not present

## 2022-03-05 DIAGNOSIS — M2410 Other articular cartilage disorders, unspecified site: Secondary | ICD-10-CM | POA: Diagnosis not present

## 2022-03-05 DIAGNOSIS — Z79890 Hormone replacement therapy (postmenopausal): Secondary | ICD-10-CM | POA: Diagnosis not present

## 2022-03-05 DIAGNOSIS — Z6827 Body mass index (BMI) 27.0-27.9, adult: Secondary | ICD-10-CM | POA: Diagnosis not present

## 2022-03-05 DIAGNOSIS — Z01419 Encounter for gynecological examination (general) (routine) without abnormal findings: Secondary | ICD-10-CM | POA: Diagnosis not present

## 2022-03-05 DIAGNOSIS — Z124 Encounter for screening for malignant neoplasm of cervix: Secondary | ICD-10-CM | POA: Diagnosis not present

## 2022-03-05 DIAGNOSIS — Z01411 Encounter for gynecological examination (general) (routine) with abnormal findings: Secondary | ICD-10-CM | POA: Diagnosis not present

## 2022-03-12 ENCOUNTER — Other Ambulatory Visit: Payer: Self-pay | Admitting: Family Medicine

## 2022-03-12 DIAGNOSIS — E034 Atrophy of thyroid (acquired): Secondary | ICD-10-CM

## 2022-03-22 NOTE — Progress Notes (Unsigned)
[facility] at [facility] [address] [place], [place] [address] [phone]  Date:  03/26/2022   Name:  Alicia Wallace   DOB:  June 08, 1945   MRN:  [id number]  PCP:  Pearline Cables, MD    Chief Complaint: No chief complaint on file.   History of Present Illness:  Alicia Wallace is a 76 y.o. 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 [employer]  Enjoys walking for exercise She does still have gynecology care at [facility]  Mammogram Cologuard up-to-date Bone density January 2022 Complete labs done in January, can update today  Calcium and vitamin D Levothyroxine Losartan Estrogen patch Nortriptyline at bedtime    Patient Active Problem List   Diagnosis Date Noted   Prediabetes 06/03/2021   Other and unspecified hyperlipidemia 08/22/2013   HTN (hypertension) 04/06/2012   Osteopenia 04/06/2012   Seasonal and perennial allergic rhinitis 07/15/2010   HYPOTHYROIDISM 05/30/2008   Asthma, mild intermittent 07/14/2007    Past Medical History:  Diagnosis Date   Allergic rhinitis    Asthma    Hypertension    Hypothyroid     Past Surgical History:  Procedure Laterality Date   APPENDECTOMY     CHOLECYSTECTOMY     TONSILLECTOMY     TOTAL ABDOMINAL HYSTERECTOMY W/ BILATERAL SALPINGOOPHORECTOMY      Social History   Tobacco Use   Smoking status: Never   Smokeless tobacco: Never  Substance Use Topics   Alcohol use: No    Alcohol/week: 0.0 standard drinks of alcohol   Drug use: No    Family History  Problem Relation Age of Onset   Kidney disease Mother    Heart disease Father    Cancer Father        bladder   Asthma Father    Allergies Father      Allergies  Allergen Reactions   Influenza Vac Split Quad Other (See Comments)    Size of grapefruit red, swollen area at injection site  Leg cramps  Vaccine given was Fluarix Quadrivalent 2014/2015 formula by [employer]   Penicillins Hives    Has patient had a PCN reaction causing immediate rash, facial/tongue/throat swelling, SOB or lightheadedness with hypotension: yes Has patient had a PCN reaction causing severe rash involving mucus membranes or skin necrosis: no Has patient had a PCN reaction that required hospitalization: no Has patient had a PCN reaction occurring within the last 10 years: no If all of the above answers are "NO", then may proceed with Cephalosporin use.    Sulfonamide Derivatives Hives    Medication list has been reviewed and updated.  Current Outpatient Medications on File Prior to Visit  Medication Sig Dispense Refill   Calcium Citrate-Vitamin D (CITRUS CALCIUM 1500 + D PO) Take 1 tablet by mouth daily.      Cholecalciferol (VITAMIN D3) 2000 UNITS TABS Take 2,000 Units by mouth daily.      estradiol (CLIMARA - DOSED IN MG/24 HR) 0.025 mg/24hr patch APPLY 1 PATCH ONCE WEEKLY  2   levocetirizine (XYZAL) 5 MG tablet TAKE 1 TABLET EVERY DAY IN THE EVENING (NEED MD APPOINTMENT) 90 tablet 3   levothyroxine (SYNTHROID) 100 MCG tablet Take 1 tablet (100 mcg total)  by mouth daily before breakfast. 90 tablet 0   losartan (COZAAR) 100 MG tablet Take 1 tablet (100 mg total) by mouth daily. 90 tablet 1   metoprolol tartrate (LOPRESSOR) 50 MG tablet Take 1 tablet (50 mg total) by mouth 2 (two) times daily. 180 tablet 3   nortriptyline (PAMELOR) 10 MG capsule Take 2 at bedtime as needed 180 capsule 3   simvastatin (ZOCOR) 20 MG tablet Take 1 tablet (20 mg total) by mouth at bedtime. 90 tablet 3   zolpidem (AMBIEN) 10 MG tablet Take 1/2 tablet at bedtime as needed for sleep. May take 1 tablet if necessary 30 tablet 5   No current facility-administered medications on file  prior to visit.    Review of Systems:  As per HPI- otherwise negative.   Physical Examination: There were no vitals filed for this visit. There were no vitals filed for this visit. There is no height or weight on file to calculate BMI. Ideal Body Weight:    GEN: no acute distress. HEENT: Atraumatic, Normocephalic.  Ears and Nose: No external deformity. CV: RRR, No M/G/R. No JVD. No thrill. No extra heart sounds. PULM: CTA B, no wheezes, crackles, rhonchi. No retractions. No resp. distress. No accessory muscle use. ABD: S, NT, ND, +BS. No rebound. No HSM. EXTR: No c/c/e PSYCH: Normally interactive. Conversant.    Assessment and Plan: As per HPI- otherwise negative. GEN: no acute distress. HEENT: Atraumatic, Normocephalic.  Ears and Nose: No external deformity. CV: RRR, No M/G/R. No JVD. No thrill. No extra heart sounds. PULM: CTA B, no wheezes, crackles, rhonchi. No retractions. No resp. distress. No accessory muscle use. ABD: S, NT, ND, +BS. No rebound. No HSM. EXTR: No c/c/e PSYCH: Normally interactive. Conversant.    Signed Abbe Amsterdam, MD

## 2022-03-22 NOTE — Patient Instructions (Incomplete)
It was great to see you again today, I will be in touch with your labs Recommend the latest COVID-19 and dose of RSV this fall if not done already Use cough syrup as needed but be cautious of sedation Stop by the imaging dept on the ground floor and schedule your CT coronary calcium test at your convenience  Please see me in 6-9 months and take care!

## 2022-03-26 ENCOUNTER — Encounter: Payer: Self-pay | Admitting: Family Medicine

## 2022-03-26 ENCOUNTER — Ambulatory Visit (INDEPENDENT_AMBULATORY_CARE_PROVIDER_SITE_OTHER): Payer: Medicare HMO | Admitting: Family Medicine

## 2022-03-26 VITALS — BP 118/72 | HR 71 | Temp 97.6°F | Resp 18 | Ht 60.0 in | Wt 147.6 lb

## 2022-03-26 DIAGNOSIS — I10 Essential (primary) hypertension: Secondary | ICD-10-CM | POA: Diagnosis not present

## 2022-03-26 DIAGNOSIS — M8580 Other specified disorders of bone density and structure, unspecified site: Secondary | ICD-10-CM

## 2022-03-26 DIAGNOSIS — E034 Atrophy of thyroid (acquired): Secondary | ICD-10-CM

## 2022-03-26 DIAGNOSIS — J301 Allergic rhinitis due to pollen: Secondary | ICD-10-CM | POA: Diagnosis not present

## 2022-03-26 DIAGNOSIS — L309 Dermatitis, unspecified: Secondary | ICD-10-CM | POA: Diagnosis not present

## 2022-03-26 DIAGNOSIS — R5383 Other fatigue: Secondary | ICD-10-CM | POA: Diagnosis not present

## 2022-03-26 DIAGNOSIS — R7303 Prediabetes: Secondary | ICD-10-CM

## 2022-03-26 DIAGNOSIS — E785 Hyperlipidemia, unspecified: Secondary | ICD-10-CM | POA: Diagnosis not present

## 2022-03-26 LAB — COMPREHENSIVE METABOLIC PANEL
ALT: 18 U/L (ref 0–35)
AST: 18 U/L (ref 0–37)
Albumin: 4 g/dL (ref 3.5–5.2)
Alkaline Phosphatase: 75 U/L (ref 39–117)
BUN: 14 mg/dL (ref 6–23)
CO2: 32 mEq/L (ref 19–32)
Calcium: 9.4 mg/dL (ref 8.4–10.5)
Chloride: 103 mEq/L (ref 96–112)
Creatinine, Ser: 0.65 mg/dL (ref 0.40–1.20)
GFR: 86.59 mL/min (ref >60.00)
Glucose, Bld: 78 mg/dL (ref 70–99)
Potassium: 4.7 mEq/L (ref 3.5–5.1)
Sodium: 138 mEq/L (ref 135–145)
Total Bilirubin: 0.3 mg/dL (ref 0.2–1.2)
Total Protein: 6.4 g/dL (ref 6.0–8.3)

## 2022-03-26 LAB — LIPID PANEL
Cholesterol: 205 mg/dL — ABNORMAL HIGH (ref 0–200)
HDL: 48.2 mg/dL (ref >39.00)
LDL Cholesterol: 119 mg/dL — ABNORMAL HIGH (ref 0–99)
NonHDL: 156.48
Total CHOL/HDL Ratio: 4
Triglycerides: 186 mg/dL — ABNORMAL HIGH (ref 0.0–149.0)
VLDL: 37.2 mg/dL (ref 0.0–40.0)

## 2022-03-26 LAB — CBC
HCT: 37.9 % (ref 36.0–46.0)
Hemoglobin: 12.5 g/dL (ref 12.0–15.0)
MCHC: 32.8 g/dL (ref 30.0–36.0)
MCV: 92.4 fl (ref 78.0–100.0)
Platelets: 339 10*3/uL (ref 150.0–400.0)
RBC: 4.1 Mil/uL (ref 3.87–5.11)
RDW: 13.7 % (ref 11.5–15.5)
WBC: 8.4 10*3/uL (ref 4.0–10.5)

## 2022-03-26 LAB — VITAMIN D 25 HYDROXY (VIT D DEFICIENCY, FRACTURES): VITD: 44.38 ng/mL (ref 30.00–100.00)

## 2022-03-26 LAB — TSH: TSH: 0.81 u[IU]/mL (ref 0.35–5.50)

## 2022-03-26 LAB — HEMOGLOBIN A1C: Hgb A1c MFr Bld: 6.3 % (ref 4.6–6.5)

## 2022-03-26 MED ORDER — LEVOTHYROXINE SODIUM 100 MCG PO TABS: 100.0000 ug | ORAL_TABLET | Freq: Every day | ORAL | 3 refills | Status: DC

## 2022-03-26 MED ORDER — PROMETHAZINE-DM 6.25-15 MG/5ML PO SYRP: 5.0000 mL | ORAL_SOLUTION | Freq: Four times a day (QID) | ORAL | 0 refills | Status: DC | PRN

## 2022-03-26 MED ORDER — TRIAMCINOLONE ACETONIDE 0.1 % EX CREA: 1.0000 | TOPICAL_CREAM | Freq: Two times a day (BID) | CUTANEOUS | 1 refills | Status: DC

## 2022-03-26 MED ORDER — LOSARTAN POTASSIUM 100 MG PO TABS: 100.0000 mg | ORAL_TABLET | Freq: Every day | ORAL | 3 refills | Status: DC

## 2022-03-26 MED ORDER — LEVOCETIRIZINE DIHYDROCHLORIDE 5 MG PO TABS: ORAL_TABLET | ORAL | 3 refills | Status: DC

## 2022-03-26 MED ORDER — METOPROLOL TARTRATE 50 MG PO TABS: 50.0000 mg | ORAL_TABLET | Freq: Two times a day (BID) | ORAL | 3 refills | Status: DC

## 2022-03-26 NOTE — Addendum Note (Signed)
Addended by: Abbe Amsterdam C on: 03/26/2022 08:04 PM   Modules accepted: Orders

## 2022-04-02 ENCOUNTER — Other Ambulatory Visit: Payer: Self-pay | Admitting: Family Medicine

## 2022-04-02 DIAGNOSIS — I10 Essential (primary) hypertension: Secondary | ICD-10-CM

## 2022-04-07 ENCOUNTER — Other Ambulatory Visit: Payer: Self-pay | Admitting: Family Medicine

## 2022-04-07 DIAGNOSIS — J301 Allergic rhinitis due to pollen: Secondary | ICD-10-CM

## 2022-04-11 ENCOUNTER — Other Ambulatory Visit (HOSPITAL_BASED_OUTPATIENT_CLINIC_OR_DEPARTMENT_OTHER): Payer: Medicare HMO

## 2022-04-12 DIAGNOSIS — Z1231 Encounter for screening mammogram for malignant neoplasm of breast: Secondary | ICD-10-CM | POA: Diagnosis not present

## 2022-04-15 ENCOUNTER — Encounter: Payer: Self-pay | Admitting: Family Medicine

## 2022-04-16 ENCOUNTER — Other Ambulatory Visit: Payer: Self-pay

## 2022-04-16 DIAGNOSIS — E785 Hyperlipidemia, unspecified: Secondary | ICD-10-CM

## 2022-04-16 MED ORDER — SIMVASTATIN 20 MG PO TABS: 20.0000 mg | ORAL_TABLET | Freq: Every day | ORAL | 3 refills | Status: DC

## 2022-04-19 ENCOUNTER — Other Ambulatory Visit: Payer: Self-pay | Admitting: Family Medicine

## 2022-04-19 DIAGNOSIS — I10 Essential (primary) hypertension: Secondary | ICD-10-CM

## 2022-05-06 ENCOUNTER — Telehealth (HOSPITAL_BASED_OUTPATIENT_CLINIC_OR_DEPARTMENT_OTHER): Payer: Self-pay

## 2022-05-13 ENCOUNTER — Other Ambulatory Visit (HOSPITAL_BASED_OUTPATIENT_CLINIC_OR_DEPARTMENT_OTHER): Payer: Medicare HMO

## 2022-05-28 ENCOUNTER — Other Ambulatory Visit: Payer: Self-pay | Admitting: Family Medicine

## 2022-05-28 DIAGNOSIS — E034 Atrophy of thyroid (acquired): Secondary | ICD-10-CM

## 2022-07-10 ENCOUNTER — Telehealth: Payer: Self-pay | Admitting: Family Medicine

## 2022-07-10 ENCOUNTER — Ambulatory Visit (HOSPITAL_BASED_OUTPATIENT_CLINIC_OR_DEPARTMENT_OTHER)
Admission: RE | Admit: 2022-07-10 | Discharge: 2022-07-10 | Disposition: A | Discharge status: Home / Self Care | Payer: Medicare HMO | Source: Ambulatory Visit | Attending: Family Medicine | Admitting: Family Medicine

## 2022-07-10 ENCOUNTER — Encounter: Payer: Self-pay | Admitting: Family Medicine

## 2022-07-10 DIAGNOSIS — E785 Hyperlipidemia, unspecified: Secondary | ICD-10-CM | POA: Insufficient documentation

## 2022-07-10 DIAGNOSIS — R7303 Prediabetes: Secondary | ICD-10-CM | POA: Insufficient documentation

## 2022-07-10 DIAGNOSIS — I10 Essential (primary) hypertension: Secondary | ICD-10-CM | POA: Insufficient documentation

## 2022-07-10 MED ORDER — DOXYCYCLINE HYCLATE 100 MG PO CAPS: 100.0000 mg | ORAL_CAPSULE | Freq: Two times a day (BID) | ORAL | 0 refills | Status: DC

## 2022-07-10 NOTE — Telephone Encounter (Signed)
Patient called requesting Doxycycline due to her symptoms of coughing and sneezing for the last 4 days (covid neg 02/28). Advised pt to make and appointment to which she declined and stated this happens to her every year. Advised patient to send a MyChart message with a detail message regarding her symptoms and pt stated she had no way of doing it. She wanted a message sent to Dr. Lorelei Pont. Please advise.

## 2022-07-10 NOTE — Telephone Encounter (Signed)
Please advise 

## 2022-07-23 ENCOUNTER — Ambulatory Visit (HOSPITAL_BASED_OUTPATIENT_CLINIC_OR_DEPARTMENT_OTHER)
Admission: RE | Admit: 2022-07-23 | Discharge: 2022-07-23 | Disposition: A | Discharge status: Home / Self Care | Payer: Medicare HMO | Source: Ambulatory Visit | Attending: Family Medicine | Admitting: Family Medicine

## 2022-07-23 ENCOUNTER — Ambulatory Visit (INDEPENDENT_AMBULATORY_CARE_PROVIDER_SITE_OTHER): Payer: Medicare HMO | Admitting: Family Medicine

## 2022-07-23 ENCOUNTER — Encounter: Payer: Self-pay | Admitting: Family Medicine

## 2022-07-23 VITALS — BP 120/78 | HR 89 | Temp 97.6°F | Resp 18 | Ht 60.0 in | Wt 146.4 lb

## 2022-07-23 DIAGNOSIS — R052 Subacute cough: Secondary | ICD-10-CM | POA: Diagnosis not present

## 2022-07-23 DIAGNOSIS — R062 Wheezing: Secondary | ICD-10-CM | POA: Diagnosis not present

## 2022-07-23 DIAGNOSIS — R059 Cough, unspecified: Secondary | ICD-10-CM | POA: Diagnosis not present

## 2022-07-23 MED ORDER — ALBUTEROL SULFATE HFA 108 (90 BASE) MCG/ACT IN AERS: 2.0000 | INHALATION_SPRAY | Freq: Four times a day (QID) | RESPIRATORY_TRACT | 0 refills | Status: DC | PRN

## 2022-07-23 MED ORDER — PREDNISONE 20 MG PO TABS: ORAL_TABLET | ORAL | 0 refills | Status: DC

## 2022-07-23 NOTE — Patient Instructions (Signed)
Good to see you today- I will be in touch with your x-ray asap If needed we can add abx For now will use 6 days of prednisone and albuterol inhaled for your symptoms Please keep me posted about how you are feeling

## 2022-07-23 NOTE — Progress Notes (Signed)
[place] at [facility] [address] [place], [place] [address] [phone]  Date:  07/23/2022   Name:  Alicia Wallace   DOB:  05/29/1945   MRN:  [id number]  PCP:  Darreld Mclean, MD    Chief Complaint: URI (Pt was started on g Doxycycline due to her symptoms of coughing and sneezing after calling the office on 07/10/22.  Pt says she feels like the congestion is now in her chest. )   History of Present Illness:  Alicia Wallace is a 77 y.o. very pleasant female patient who presents with the following: Most recent visit with myself was in November - History of hypertension, hypothyroidism, hyperlipidemia, prediabetes     Pt seen today with concern of "medication not working" - she contacted [place] about 2 weeks ago as follows: Patient called requesting Doxycycline due to her symptoms of coughing and sneezing for the last 4 days (covid neg 02/28). Advised pt to make and appointment to which she declined and stated this happens to her every year. Advised patient to send a MyChart message with a detail message regarding her symptoms and pt stated she had no way of doing it. She wanted a message sent to Dr. Lorelei Pont. Please advise.    I did rx doxycycline for her at that time   Pt notes the doxycycline did seem to help at first, but she can now hear her "chest rattling" No wheezing She feels like taking a deep breath is harder than normal though not particularly short of breath Cough is still productive No fever She feels tired and drained The cough is wearing her out No GI symptoms She is not getting much better although also not really worse   Lab Results  Component Value Date   HGBA1C 6.3 03/26/2022    Patient Active Problem List   Diagnosis Date Noted   Prediabetes 06/03/2021   Other and unspecified hyperlipidemia 08/22/2013   HTN (hypertension) 04/06/2012   Osteopenia 04/06/2012   Seasonal and perennial allergic rhinitis  07/15/2010   HYPOTHYROIDISM 05/30/2008   Asthma, mild intermittent 07/14/2007    Past Medical History:  Diagnosis Date   Allergic rhinitis    Asthma    Hypertension    Hypothyroid     Past Surgical History:  Procedure Laterality Date   APPENDECTOMY     CHOLECYSTECTOMY     TONSILLECTOMY     TOTAL ABDOMINAL HYSTERECTOMY W/ BILATERAL SALPINGOOPHORECTOMY      Social History   Tobacco Use   Smoking status: Never   Smokeless tobacco: Never  Substance Use Topics   Alcohol use: No    Alcohol/week: 0.0 standard drinks of alcohol   Drug use: No    Family History  Problem Relation Age of Onset   Kidney disease Mother    Heart disease Father    Cancer Father        bladder   Asthma Father    Allergies Father     Allergies  Allergen Reactions   Influenza Vac Split Quad Other (See Comments)    Size of grapefruit red, swollen area at injection site  Leg cramps  Vaccine given was Fluarix Quadrivalent 2014/2015 formula by [employer]   Penicillins Hives    Has patient had a PCN reaction causing immediate rash, facial/tongue/throat swelling, SOB or lightheadedness with hypotension: yes Has patient had a PCN reaction causing severe rash involving mucus membranes or skin necrosis: no Has patient  had a PCN reaction that required hospitalization: no Has patient had a PCN reaction occurring within the last 10 years: no If all of the above answers are "NO", then may proceed with Cephalosporin use.    Sulfonamide Derivatives Hives    Medication list has been reviewed and updated.  Current Outpatient Medications on File Prior to Visit  Medication Sig Dispense Refill   Calcium Citrate-Vitamin D (CITRUS CALCIUM 1500 + D PO) Take 1 tablet by mouth daily.      Cholecalciferol (VITAMIN D3) 2000 UNITS TABS Take 2,000 Units by mouth daily.      estradiol (CLIMARA - DOSED IN MG/24 HR) 0.025 mg/24hr patch APPLY 1 PATCH ONCE WEEKLY  2   levocetirizine (XYZAL) 5 MG tablet Take 1 tablet every  day in the evening. 90 tablet 3   levothyroxine (SYNTHROID) 100 MCG tablet Take 1 tablet (100 mcg total) by mouth daily before breakfast. 90 tablet 1   losartan (COZAAR) 100 MG tablet TAKE ONE TABLET BY MOUTH DAILY FOR BLOOD PRESSURE 90 tablet 3   metoprolol tartrate (LOPRESSOR) 50 MG tablet Take 1 tablet (50 mg total) by mouth 2 (two) times daily. 180 tablet 3   nortriptyline (PAMELOR) 10 MG capsule Take 2 at bedtime as needed 180 capsule 3   promethazine-dextromethorphan (PROMETHAZINE-DM) 6.25-15 MG/5ML syrup TAKE 5ML FOUR TIMES DAILY AS NEEDED FOR COUGH 118 mL 0   simvastatin (ZOCOR) 20 MG tablet Take 1 tablet (20 mg total) by mouth at bedtime. 90 tablet 3   triamcinolone cream (KENALOG) 0.1 % Apply 1 Application topically 2 (two) times daily. Use as needed for rash 30 g 1   zolpidem (AMBIEN) 10 MG tablet Take 1/2 tablet at bedtime as needed for sleep. May take 1 tablet if necessary 30 tablet 5   No current facility-administered medications on file prior to visit.    Review of Systems:  As per HPI- otherwise negative.   Physical Examination: Vitals:   07/23/22 1000  BP: 120/78  Pulse: 89  Resp: 18  Temp: 97.6 F (36.4 C)  SpO2: 97%   Vitals:   07/23/22 1000  Weight: 146 lb 6.4 oz (66.4 kg)  Height: 5' (1.524 m)   Body mass index is 28.59 kg/m. Ideal Body Weight: Weight in (lb) to have BMI = 25: 127.7  GEN: no acute distress.  Mild overweight, looks well HEENT: Atraumatic, Normocephalic.  Bilateral TM wnl, oropharynx normal.  PEERL,EOMI.   Ears and Nose: No external deformity. CV: RRR, No M/G/R. No JVD. No thrill. No extra heart sounds. PULM: Mild wheezes heard bilaterally, no crackles, rhonchi. No retractions. No resp. distress. No accessory muscle use. EXTR: No c/c/e PSYCH: Normally interactive. Conversant.    Assessment and Plan: Subacute cough - Plan: DG Chest 2 View, albuterol (VENTOLIN HFA) 108 (90 Base) MCG/ACT inhaler, predniSONE (DELTASONE) 20 MG  tablet  Patient seen today with cough, we treated her with doxycycline 10 days ago.  She has now completed this course.  She is not feeling worse but notes she continues to have cough, congestion, I can auscultate wheezing today.  We will obtain a chest x-ray to look for any sign of pneumonia.  Will treat her with albuterol and prednisone-She has used both of these in the past with no ill effects  Signed Lamar Blinks, MD  Received chest x-ray as below, message to patient  DG Chest 2 View  Result Date: 07/23/2022 CLINICAL DATA:  Cough and wheezing. EXAM: CHEST - 2 VIEW COMPARISON:  05/17/2012  FINDINGS: Heart size and mediastinal contours are unremarkable. Lung volumes are low. No pleural effusion or interstitial edema. No airspace opacities identified. Visualized osseous structures are unremarkable. IMPRESSION: Low lung volumes. No acute findings. Electronically Signed   By: Kerby Moors M.D.   On: 07/23/2022 10:50

## 2022-08-01 ENCOUNTER — Other Ambulatory Visit: Payer: Self-pay | Admitting: Family Medicine

## 2022-08-01 DIAGNOSIS — L309 Dermatitis, unspecified: Secondary | ICD-10-CM

## 2022-08-17 ENCOUNTER — Other Ambulatory Visit: Payer: Self-pay | Admitting: Family Medicine

## 2022-08-17 DIAGNOSIS — R052 Subacute cough: Secondary | ICD-10-CM

## 2022-08-19 ENCOUNTER — Encounter: Payer: Self-pay | Admitting: Family Medicine

## 2022-08-19 ENCOUNTER — Telehealth: Payer: Self-pay

## 2022-08-19 NOTE — Telephone Encounter (Signed)
PA denied.   The Medicare rule in the Prescription Drug Manual  (Chapter 6, Section 20.1) says drugs used for the  symptomatic relief of cough and colds are excluded  from Medicare Part D coverage. [employer] follows  Medicare rules. Your drug is used to relieve  symptoms of cough and cold and per Medicare rules  is not covered.

## 2022-08-19 NOTE — Telephone Encounter (Signed)
PA initiated via Covermymeds; KEY: [id number]. Awaiting determination.

## 2022-09-16 DIAGNOSIS — H2513 Age-related nuclear cataract, bilateral: Secondary | ICD-10-CM | POA: Diagnosis not present

## 2022-09-16 DIAGNOSIS — H5213 Myopia, bilateral: Secondary | ICD-10-CM | POA: Diagnosis not present

## 2022-09-16 DIAGNOSIS — H524 Presbyopia: Secondary | ICD-10-CM | POA: Diagnosis not present

## 2022-09-16 DIAGNOSIS — H52223 Regular astigmatism, bilateral: Secondary | ICD-10-CM | POA: Diagnosis not present

## 2022-09-19 ENCOUNTER — Telehealth: Payer: Self-pay | Admitting: Family Medicine

## 2022-09-19 DIAGNOSIS — J301 Allergic rhinitis due to pollen: Secondary | ICD-10-CM

## 2022-09-19 MED ORDER — DOXYCYCLINE HYCLATE 100 MG PO CAPS: 100.0000 mg | ORAL_CAPSULE | Freq: Two times a day (BID) | ORAL | 0 refills | Status: DC

## 2022-09-19 MED ORDER — PROMETHAZINE-DM 6.25-15 MG/5ML PO SYRP: ORAL_SOLUTION | ORAL | 0 refills | Status: DC

## 2022-09-19 NOTE — Telephone Encounter (Signed)
Pt aware and voices understanding.   

## 2022-09-19 NOTE — Telephone Encounter (Signed)
Pt last seen on 07/23/22. Was treated with Ventolin and Prednisone. She was last tx with Doxy on 07/10/22.  Promethazine-DM) 6.25-15 not covered by insurance.  Please advise.

## 2022-09-19 NOTE — Addendum Note (Signed)
Addended by: Abbe Amsterdam C on: 09/19/2022 01:16 PM   Modules accepted: Orders

## 2022-09-19 NOTE — Telephone Encounter (Signed)
Pt unable to get in to her mychart at the moment and doesn't feel like coming in to see anyone in the office. Please advise

## 2022-09-19 NOTE — Telephone Encounter (Signed)
Pt called stating her Sinus Infection has returned and was wondering if cough syrup and doxycycline could be called in for her to :  [id number] [place], [place] - [address] [place] [address] Phone: [phone]  Fax: [phone]

## 2022-09-19 NOTE — Telephone Encounter (Signed)
Called her back- she notes sx of a sinus infection.  She started getting sick She is coughing a lot and brinigng up a lot of mucus No vomiting or diarrhea-  Negative for covid  Sx about 3 days now No fever   She has responded well to doxy and phenergan DM syrup for similar in the past- will send in rx Cautioned regarding sedation  She will let me know if not improving soon - Sooner if worse.

## 2022-09-22 ENCOUNTER — Telehealth: Payer: Self-pay | Admitting: Family Medicine

## 2022-09-22 NOTE — Telephone Encounter (Signed)
Pt is not improving and she can't stop coughing and her "lungs don't sound right." No openings in our office and pt does not want to go to ER. Pt wants to know if she can be worked in. Please call to advise

## 2022-09-22 NOTE — Telephone Encounter (Signed)
Advised pt of what was available and she did not want to go far out and would like to be seen at our office in the morning.  Appointment scheduled with Vernona Rieger for 840am 09/23/22.  Pt advised to go to ER or call 911 immediately if she gets worse and not to wait til appt.

## 2022-09-23 ENCOUNTER — Other Ambulatory Visit: Payer: Self-pay

## 2022-09-23 ENCOUNTER — Emergency Department (HOSPITAL_BASED_OUTPATIENT_CLINIC_OR_DEPARTMENT_OTHER): Payer: Medicare HMO

## 2022-09-23 ENCOUNTER — Ambulatory Visit: Payer: Medicare HMO | Admitting: Family

## 2022-09-23 ENCOUNTER — Encounter (HOSPITAL_BASED_OUTPATIENT_CLINIC_OR_DEPARTMENT_OTHER): Payer: Self-pay

## 2022-09-23 ENCOUNTER — Inpatient Hospital Stay (HOSPITAL_BASED_OUTPATIENT_CLINIC_OR_DEPARTMENT_OTHER)
Admission: EM | Admit: 2022-09-23 | Discharge: 2022-09-25 | DRG: 194 | Disposition: A | Discharge status: Home / Self Care | Payer: Medicare HMO | Attending: Family Medicine | Admitting: Family Medicine

## 2022-09-23 DIAGNOSIS — R8271 Bacteriuria: Secondary | ICD-10-CM | POA: Diagnosis not present

## 2022-09-23 DIAGNOSIS — R0989 Other specified symptoms and signs involving the circulatory and respiratory systems: Secondary | ICD-10-CM | POA: Diagnosis not present

## 2022-09-23 DIAGNOSIS — Z841 Family history of disorders of kidney and ureter: Secondary | ICD-10-CM | POA: Diagnosis not present

## 2022-09-23 DIAGNOSIS — M7989 Other specified soft tissue disorders: Secondary | ICD-10-CM | POA: Diagnosis not present

## 2022-09-23 DIAGNOSIS — Z90722 Acquired absence of ovaries, bilateral: Secondary | ICD-10-CM

## 2022-09-23 DIAGNOSIS — J189 Pneumonia, unspecified organism: Principal | ICD-10-CM | POA: Diagnosis present

## 2022-09-23 DIAGNOSIS — D72829 Elevated white blood cell count, unspecified: Secondary | ICD-10-CM | POA: Diagnosis present

## 2022-09-23 DIAGNOSIS — Z882 Allergy status to sulfonamides status: Secondary | ICD-10-CM | POA: Diagnosis not present

## 2022-09-23 DIAGNOSIS — Z825 Family history of asthma and other chronic lower respiratory diseases: Secondary | ICD-10-CM

## 2022-09-23 DIAGNOSIS — R7989 Other specified abnormal findings of blood chemistry: Secondary | ICD-10-CM | POA: Diagnosis not present

## 2022-09-23 DIAGNOSIS — R5381 Other malaise: Secondary | ICD-10-CM | POA: Diagnosis present

## 2022-09-23 DIAGNOSIS — Z79899 Other long term (current) drug therapy: Secondary | ICD-10-CM | POA: Diagnosis not present

## 2022-09-23 DIAGNOSIS — R079 Chest pain, unspecified: Secondary | ICD-10-CM | POA: Diagnosis not present

## 2022-09-23 DIAGNOSIS — R0902 Hypoxemia: Secondary | ICD-10-CM | POA: Diagnosis present

## 2022-09-23 DIAGNOSIS — R000 Tachycardia, unspecified: Secondary | ICD-10-CM | POA: Diagnosis present

## 2022-09-23 DIAGNOSIS — Z8249 Family history of ischemic heart disease and other diseases of the circulatory system: Secondary | ICD-10-CM

## 2022-09-23 DIAGNOSIS — Z79890 Hormone replacement therapy (postmenopausal): Secondary | ICD-10-CM | POA: Diagnosis not present

## 2022-09-23 DIAGNOSIS — Z880 Allergy status to penicillin: Secondary | ICD-10-CM

## 2022-09-23 DIAGNOSIS — Z9079 Acquired absence of other genital organ(s): Secondary | ICD-10-CM | POA: Diagnosis not present

## 2022-09-23 DIAGNOSIS — Z1152 Encounter for screening for COVID-19: Secondary | ICD-10-CM | POA: Diagnosis not present

## 2022-09-23 DIAGNOSIS — Z8052 Family history of malignant neoplasm of bladder: Secondary | ICD-10-CM | POA: Diagnosis not present

## 2022-09-23 DIAGNOSIS — R0682 Tachypnea, not elsewhere classified: Secondary | ICD-10-CM | POA: Diagnosis present

## 2022-09-23 DIAGNOSIS — J4520 Mild intermittent asthma, uncomplicated: Secondary | ICD-10-CM | POA: Diagnosis present

## 2022-09-23 DIAGNOSIS — E039 Hypothyroidism, unspecified: Secondary | ICD-10-CM | POA: Diagnosis present

## 2022-09-23 DIAGNOSIS — R0981 Nasal congestion: Secondary | ICD-10-CM | POA: Diagnosis present

## 2022-09-23 DIAGNOSIS — R0789 Other chest pain: Secondary | ICD-10-CM | POA: Diagnosis not present

## 2022-09-23 DIAGNOSIS — Z90710 Acquired absence of both cervix and uterus: Secondary | ICD-10-CM | POA: Diagnosis not present

## 2022-09-23 DIAGNOSIS — R32 Unspecified urinary incontinence: Secondary | ICD-10-CM | POA: Diagnosis present

## 2022-09-23 DIAGNOSIS — K5730 Diverticulosis of large intestine without perforation or abscess without bleeding: Secondary | ICD-10-CM | POA: Diagnosis not present

## 2022-09-23 DIAGNOSIS — I10 Essential (primary) hypertension: Secondary | ICD-10-CM | POA: Diagnosis not present

## 2022-09-23 DIAGNOSIS — Z9049 Acquired absence of other specified parts of digestive tract: Secondary | ICD-10-CM | POA: Diagnosis not present

## 2022-09-23 DIAGNOSIS — N200 Calculus of kidney: Secondary | ICD-10-CM | POA: Diagnosis not present

## 2022-09-23 DIAGNOSIS — N179 Acute kidney failure, unspecified: Secondary | ICD-10-CM | POA: Diagnosis present

## 2022-09-23 DIAGNOSIS — Z887 Allergy status to serum and vaccine status: Secondary | ICD-10-CM

## 2022-09-23 LAB — CBC WITH DIFFERENTIAL/PLATELET
Abs Immature Granulocytes: 0.1 10*3/uL — ABNORMAL HIGH (ref 0.00–0.07)
Basophils Absolute: 0 10*3/uL (ref 0.0–0.1)
Basophils Relative: 0 %
Eosinophils Absolute: 0.2 10*3/uL (ref 0.0–0.5)
Eosinophils Relative: 1 %
HCT: 34.6 % — ABNORMAL LOW (ref 36.0–46.0)
Hemoglobin: 11.5 g/dL — ABNORMAL LOW (ref 12.0–15.0)
Immature Granulocytes: 1 %
Lymphocytes Relative: 18 %
Lymphs Abs: 2.5 10*3/uL (ref 0.7–4.0)
MCH: 29.6 pg (ref 26.0–34.0)
MCHC: 33.2 g/dL (ref 30.0–36.0)
MCV: 89.2 fL (ref 80.0–100.0)
Monocytes Absolute: 1.2 10*3/uL — ABNORMAL HIGH (ref 0.1–1.0)
Monocytes Relative: 8 %
Neutro Abs: 10.2 10*3/uL — ABNORMAL HIGH (ref 1.7–7.7)
Neutrophils Relative %: 72 %
Platelets: 417 10*3/uL — ABNORMAL HIGH (ref 150–400)
RBC: 3.88 MIL/uL (ref 3.87–5.11)
RDW: 13.5 % (ref 11.5–15.5)
WBC: 14.2 10*3/uL — ABNORMAL HIGH (ref 4.0–10.5)
nRBC: 0 % (ref 0.0–0.2)

## 2022-09-23 LAB — PROTIME-INR
INR: 1 (ref 0.8–1.2)
Prothrombin Time: 13.4 seconds (ref 11.4–15.2)

## 2022-09-23 LAB — URINALYSIS, W/ REFLEX TO CULTURE (INFECTION SUSPECTED)
Bilirubin Urine: NEGATIVE
Glucose, UA: NEGATIVE mg/dL
Ketones, ur: NEGATIVE mg/dL
Nitrite: POSITIVE — AB
Protein, ur: NEGATIVE mg/dL
Specific Gravity, Urine: <=1.005 (ref 1.005–1.030)
pH: 5.5 (ref 5.0–8.0)

## 2022-09-23 LAB — BASIC METABOLIC PANEL
Anion gap: 9 (ref 5–15)
BUN: 26 mg/dL — ABNORMAL HIGH (ref 8–23)
CO2: 20 mmol/L — ABNORMAL LOW (ref 22–32)
Calcium: 7.7 mg/dL — ABNORMAL LOW (ref 8.9–10.3)
Chloride: 107 mmol/L (ref 98–111)
Creatinine, Ser: 1.84 mg/dL — ABNORMAL HIGH (ref 0.44–1.00)
GFR, Estimated: 28 mL/min — ABNORMAL LOW (ref >60)
Glucose, Bld: 100 mg/dL — ABNORMAL HIGH (ref 70–99)
Potassium: 3.8 mmol/L (ref 3.5–5.1)
Sodium: 136 mmol/L (ref 135–145)

## 2022-09-23 LAB — COMPREHENSIVE METABOLIC PANEL
ALT: 45 U/L — ABNORMAL HIGH (ref 0–44)
AST: 43 U/L — ABNORMAL HIGH (ref 15–41)
Albumin: 3 g/dL — ABNORMAL LOW (ref 3.5–5.0)
Alkaline Phosphatase: 126 U/L (ref 38–126)
Anion gap: 12 (ref 5–15)
BUN: 29 mg/dL — ABNORMAL HIGH (ref 8–23)
CO2: 23 mmol/L (ref 22–32)
Calcium: 8.6 mg/dL — ABNORMAL LOW (ref 8.9–10.3)
Chloride: 101 mmol/L (ref 98–111)
Creatinine, Ser: 2.09 mg/dL — ABNORMAL HIGH (ref 0.44–1.00)
GFR, Estimated: 24 mL/min — ABNORMAL LOW (ref >60)
Glucose, Bld: 139 mg/dL — ABNORMAL HIGH (ref 70–99)
Potassium: 3.5 mmol/L (ref 3.5–5.1)
Sodium: 136 mmol/L (ref 135–145)
Total Bilirubin: 0.4 mg/dL (ref 0.3–1.2)
Total Protein: 7.2 g/dL (ref 6.5–8.1)

## 2022-09-23 LAB — LACTIC ACID, PLASMA: Lactic Acid, Venous: 1.5 mmol/L (ref 0.5–1.9)

## 2022-09-23 LAB — RESP PANEL BY RT-PCR (RSV, FLU A&B, COVID)  RVPGX2
Influenza A by PCR: NEGATIVE
Influenza B by PCR: NEGATIVE
Resp Syncytial Virus by PCR: NEGATIVE
SARS Coronavirus 2 by RT PCR: NEGATIVE

## 2022-09-23 LAB — TROPONIN I (HIGH SENSITIVITY)
Troponin I (High Sensitivity): 5 ng/L (ref <18)
Troponin I (High Sensitivity): 4 ng/L (ref <18)

## 2022-09-23 LAB — D-DIMER, QUANTITATIVE: D-Dimer, Quant: 2.32 ug/mL-FEU — ABNORMAL HIGH (ref 0.00–0.50)

## 2022-09-23 MED ORDER — SIMVASTATIN 20 MG PO TABS: 20.0000 mg | ORAL_TABLET | Freq: Every day | ORAL | Status: DC

## 2022-09-23 MED ORDER — SODIUM CHLORIDE 0.9 % IV SOLN: INTRAVENOUS | Status: DC | PRN

## 2022-09-23 MED ORDER — GUAIFENESIN 100 MG/5ML PO LIQD
5.0000 mL | ORAL | Status: DC | PRN
  Administered 2022-09-23 – 2022-09-25 (×4): 5 mL via ORAL
  Filled 2022-09-23 (×4): qty 10

## 2022-09-23 MED ORDER — SODIUM CHLORIDE 0.9 % IV BOLUS
1000.0000 mL | Freq: Once | INTRAVENOUS | Status: AC
  Administered 2022-09-23: 1000 mL via INTRAVENOUS

## 2022-09-23 MED ORDER — ALBUTEROL SULFATE (2.5 MG/3ML) 0.083% IN NEBU: 2.5000 mg | INHALATION_SOLUTION | RESPIRATORY_TRACT | Status: DC | PRN

## 2022-09-23 MED ORDER — ONDANSETRON HCL 4 MG PO TABS: 4.0000 mg | ORAL_TABLET | Freq: Four times a day (QID) | ORAL | Status: DC | PRN

## 2022-09-23 MED ORDER — HYDROMORPHONE HCL 1 MG/ML IJ SOLN
0.5000 mg | Freq: Once | INTRAMUSCULAR | Status: AC
  Administered 2022-09-23: 0.5 mg via INTRAVENOUS
  Filled 2022-09-23: qty 1

## 2022-09-23 MED ORDER — SODIUM CHLORIDE 0.9% FLUSH
3.0000 mL | Freq: Two times a day (BID) | INTRAVENOUS | Status: DC
  Administered 2022-09-23 – 2022-09-25 (×4): 3 mL via INTRAVENOUS

## 2022-09-23 MED ORDER — LEVOTHYROXINE SODIUM 100 MCG PO TABS
100.0000 ug | ORAL_TABLET | Freq: Every day | ORAL | Status: DC
  Administered 2022-09-24 – 2022-09-25 (×2): 100 ug via ORAL
  Filled 2022-09-23 (×2): qty 1

## 2022-09-23 MED ORDER — LACTATED RINGERS IV SOLN: INTRAVENOUS | Status: AC

## 2022-09-23 MED ORDER — OXYCODONE HCL 5 MG PO TABS: 5.0000 mg | ORAL_TABLET | ORAL | Status: DC | PRN

## 2022-09-23 MED ORDER — ZOLPIDEM TARTRATE 5 MG PO TABS
5.0000 mg | ORAL_TABLET | Freq: Every evening | ORAL | Status: DC | PRN
  Administered 2022-09-23: 5 mg via ORAL
  Filled 2022-09-23: qty 1

## 2022-09-23 MED ORDER — ACETAMINOPHEN 325 MG PO TABS: 650.0000 mg | ORAL_TABLET | Freq: Four times a day (QID) | ORAL | Status: DC | PRN

## 2022-09-23 MED ORDER — LEVOFLOXACIN IN D5W 750 MG/150ML IV SOLN: 750.0000 mg | INTRAVENOUS | Status: DC

## 2022-09-23 MED ORDER — HEPARIN SODIUM (PORCINE) 5000 UNIT/ML IJ SOLN
5000.0000 [IU] | Freq: Three times a day (TID) | INTRAMUSCULAR | Status: DC
  Administered 2022-09-23 – 2022-09-25 (×5): 5000 [IU] via SUBCUTANEOUS
  Filled 2022-09-23 (×5): qty 1

## 2022-09-23 MED ORDER — HYDRALAZINE HCL 25 MG PO TABS: 25.0000 mg | ORAL_TABLET | Freq: Four times a day (QID) | ORAL | Status: DC | PRN

## 2022-09-23 MED ORDER — ACETAMINOPHEN 650 MG RE SUPP: 650.0000 mg | Freq: Four times a day (QID) | RECTAL | Status: DC | PRN

## 2022-09-23 MED ORDER — LEVOFLOXACIN IN D5W 750 MG/150ML IV SOLN
750.0000 mg | Freq: Once | INTRAVENOUS | Status: AC
  Administered 2022-09-23: 750 mg via INTRAVENOUS
  Filled 2022-09-23: qty 150

## 2022-09-23 MED ORDER — ONDANSETRON HCL 4 MG/2ML IJ SOLN: 4.0000 mg | Freq: Four times a day (QID) | INTRAMUSCULAR | Status: DC | PRN

## 2022-09-23 NOTE — ED Notes (Signed)
Please call patient's neighbor Bonita Quin on cell number once patient is ready for discharge.

## 2022-09-23 NOTE — ED Notes (Signed)
Patient returned to room from CT. 

## 2022-09-23 NOTE — Progress Notes (Signed)
Pharmacy Antibiotic Note  Alicia Wallace is a 77 y.o. female admitted on 09/23/2022 with pneumonia.  Pharmacy has been consulted for Levaquin dosing.  In ED, Levaquin 750 mg IV x 1 administered at 1409  Plan: Continue Levaquin 750 mg IV every 48 hours Monitor clinical progress and renal function F/U procalcitonin, resp panel, C&S / LOT   Height: 5' (152.4 cm) Weight: 58.1 kg (128 lb) IBW/kg (Calculated) : 45.5  Temp (24hrs), Avg:97.9 F (36.6 C), Min:97.5 F (36.4 C), Max:98.4 F (36.9 C)  Recent Labs  Lab 09/23/22 0651 09/23/22 1036  WBC 14.2*  --   CREATININE 2.09* 1.84*  LATICACIDVEN 1.5  --     Estimated Creatinine Clearance: 21.1 mL/min (A) (by C-G formula based on SCr of 1.84 mg/dL (H)).    Allergies  Allergen Reactions   Influenza Vac Split Quad Other (See Comments)    Size of grapefruit red, swollen area at injection site  Leg cramps  Vaccine given was Fluarix Quadrivalent 2014/2015 formula by [employer]   Penicillins Hives    Has patient had a PCN reaction causing immediate rash, facial/tongue/throat swelling, SOB or lightheadedness with hypotension: yes Has patient had a PCN reaction causing severe rash involving mucus membranes or skin necrosis: no Has patient had a PCN reaction that required hospitalization: no Has patient had a PCN reaction occurring within the last 10 years: no If all of the above answers are "NO", then may proceed with Cephalosporin use.    Sulfonamide Derivatives Hives    Antimicrobials this admission: 5/14 Levaquin >>   Dose adjustments this admission: N/a  Microbiology results: 5/14 BCx: sent 5/14 UCx: sent    Thank you for allowing pharmacy to be a part of this patient's care.  Lynden Ang, PharmD, BCPS 09/23/2022 6:15 PM

## 2022-09-23 NOTE — ED Notes (Signed)
Patient transported to CT 

## 2022-09-23 NOTE — Progress Notes (Signed)
Received a call from med [facility] regarding admission for this patient.  Patient presented to the ER with complaints of shortness of breath, cough.  Apparently there was a recent thought that she had some URI type symptoms therefore was placed on doxycycline but her symptoms continued therefore comes to the hospital.  Upon admission she was noted to be tachycardic and gets hypoxic when speaking long sentences.  She also had mild leukocytosis therefore EDP cultured the patient and gave her fluid bolus.  Chest x-ray consistent with bilateral multifocal infiltrate.  Patient started on Levaquin.  Requesting admission.  I requested EDP to obtain BNP, procalcitonin, respiratory panel.  As long as blood pressure is stable, advised caution with IV fluids.  Supportive care.  Admit orders placed.  Stephania Fragmin MD [facility]

## 2022-09-23 NOTE — ED Notes (Signed)
Attempted report x1, receiving RN unavailable at this time.  

## 2022-09-23 NOTE — ED Provider Notes (Signed)
[facility] Provider Note   CSN: [id number] Arrival date & time: 09/23/22  4098     History  Chief Complaint  Patient presents with   Abdominal Pain    Alicia Wallace is a 77 y.o. female.  HPI   77 year old female presents emergency department with nasal congestion, profuse cough left-sided abdominal pain.  Patient states her symptoms started 4 days ago with intermittently productive cough.  She called her primary doctor, presumed to have a sinus infection and placed on Doxy which is typically what does well for her.  However her cough has continued.  Now she is having intermittent episodes of chest pain and left-sided abdominal pain specifically when coughing.  She feels short of breath even while at rest.  Denies any hemoptysis, history of DVT/PE.  She had an episode of fever over the weekend, Tmax 100.8.  Denies any GI symptoms.  Admits to significant decreased p.o. intake and urinary incontinence with profuse coughing.  Denies any dysuria/hematuria.  Home Medications Prior to Admission medications   Medication Sig Start Date End Date Taking? Authorizing Provider  albuterol (VENTOLIN HFA) 108 (90 Base) MCG/ACT inhaler TAKE 2 PUFFS BY MOUTH EVERY 6 HOURS AS NEEDED FOR WHEEZE OR SHORTNESS OF BREATH 08/18/22   Copland, Gwenlyn Found, MD  Calcium Citrate-Vitamin D (CITRUS CALCIUM 1500 + D PO) Take 1 tablet by mouth daily.     [provider]  Cholecalciferol (VITAMIN D3) 2000 UNITS TABS Take 2,000 Units by mouth daily.     [provider]  doxycycline (VIBRAMYCIN) 100 MG capsule Take 1 capsule (100 mg total) by mouth 2 (two) times daily. 09/19/22   Copland, Gwenlyn Found, MD  estradiol (CLIMARA - DOSED IN MG/24 HR) 0.025 mg/24hr patch APPLY 1 PATCH ONCE WEEKLY 02/19/17   [provider]  levocetirizine (XYZAL) 5 MG tablet Take 1 tablet every day in the evening. 03/26/22   Copland, Gwenlyn Found, MD  levothyroxine (SYNTHROID) 100  MCG tablet Take 1 tablet (100 mcg total) by mouth daily before breakfast. 05/28/22   Copland, Gwenlyn Found, MD  losartan (COZAAR) 100 MG tablet TAKE ONE TABLET BY MOUTH DAILY FOR BLOOD PRESSURE 04/21/22   Copland, Gwenlyn Found, MD  metoprolol tartrate (LOPRESSOR) 50 MG tablet Take 1 tablet (50 mg total) by mouth 2 (two) times daily. 03/26/22   Copland, Gwenlyn Found, MD  nortriptyline (PAMELOR) 10 MG capsule Take 2 at bedtime as needed 09/05/16   Copland, Gwenlyn Found, MD  predniSONE (DELTASONE) 20 MG tablet Take 40 mg by mouth daily for 3 days, then 20 mg daily for 3 days 07/23/22   Copland, Gwenlyn Found, MD  promethazine-dextromethorphan (PROMETHAZINE-DM) 6.25-15 MG/5ML syrup TAKE FOUR TIMES DAILY AS NEEDED FOR COUGH 09/19/22   Copland, Gwenlyn Found, MD  simvastatin (ZOCOR) 20 MG tablet Take 1 tablet (20 mg total) by mouth at bedtime. 04/16/22   Copland, Gwenlyn Found, MD  triamcinolone cream (KENALOG) 0.1 % Apply topically 2 (two) times daily as needed. 08/04/22   Copland, Gwenlyn Found, MD  zolpidem (AMBIEN) 10 MG tablet Take 1/2 tablet at bedtime as needed for sleep. May take 1 tablet if necessary 08/27/16   Copland, Gwenlyn Found, MD      Allergies    Influenza vac split quad, Penicillins, and Sulfonamide derivatives    Review of Systems   Review of Systems  Constitutional:  Positive for appetite change, chills, fatigue and fever.  HENT:  Positive for congestion, postnasal drip, rhinorrhea  and sinus pain.   Respiratory:  Positive for cough, chest tightness and shortness of breath.   Cardiovascular:  Positive for chest pain. Negative for palpitations and leg swelling.  Gastrointestinal:  Positive for abdominal pain. Negative for diarrhea and vomiting.  Genitourinary:  Negative for dysuria and hematuria.  Musculoskeletal:  Negative for back pain.  Skin:  Negative for rash.  Neurological:  Negative for headaches.    Physical Exam Updated Vital Signs BP 124/78   Pulse (!) 123   Temp (!) 97.5 F (36.4 C) (Oral)    Resp (!) 31   Ht 5' (1.524 m)   Wt 58.1 kg   SpO2 94%   BMI 25.00 kg/m  Physical Exam Vitals and nursing note reviewed.  Constitutional:      General: She is not in acute distress.    Appearance: Normal appearance. She is ill-appearing.  HENT:     Head: Normocephalic.     Mouth/Throat:     Mouth: Mucous membranes are moist.  Cardiovascular:     Rate and Rhythm: Normal rate.  Pulmonary:     Breath sounds: No wheezing or rales.     Comments: tachypneic Abdominal:     General: Bowel sounds are decreased. There is no distension.     Palpations: Abdomen is soft.     Tenderness: There is abdominal tenderness.     Comments: Mild TTP of left lateral abdomen, just above him, no CVA TTP  Skin:    General: Skin is warm.  Neurological:     Mental Status: She is alert and oriented to person, place, and time. Mental status is at baseline.  Psychiatric:        Mood and Affect: Mood normal.     ED Results / Procedures / Treatments   Labs (all labs ordered are listed, but only abnormal results are displayed) Labs Reviewed  COMPREHENSIVE METABOLIC PANEL - Abnormal; Notable for the following components:      Result Value   Glucose, Bld 139 (*)    BUN 29 (*)    Creatinine, Ser 2.09 (*)    Calcium 8.6 (*)    Albumin 3.0 (*)    AST 43 (*)    ALT 45 (*)    GFR, Estimated 24 (*)    All other components within normal limits  CBC WITH DIFFERENTIAL/PLATELET - Abnormal; Notable for the following components:   WBC 14.2 (*)    Hemoglobin 11.5 (*)    HCT 34.6 (*)    Platelets 417 (*)    Neutro Abs 10.2 (*)    Monocytes Absolute 1.2 (*)    Abs Immature Granulocytes 0.10 (*)    All other components within normal limits  URINALYSIS, W/ REFLEX TO CULTURE (INFECTION SUSPECTED) - Abnormal; Notable for the following components:   Color, Urine STRAW (*)    APPearance HAZY (*)    Hgb urine dipstick TRACE (*)    Nitrite POSITIVE (*)    Leukocytes,Ua SMALL (*)    Bacteria, UA FEW (*)    All  other components within normal limits  CULTURE, BLOOD (ROUTINE X 2)  CULTURE, BLOOD (ROUTINE X 2)  LACTIC ACID, PLASMA  PROTIME-INR  D-DIMER, QUANTITATIVE  TROPONIN I (HIGH SENSITIVITY)    EKG EKG Interpretation  Date/Time:  Tuesday Sep 23 2022 06:43:32 EDT Ventricular Rate:  133 PR Interval:  112 QRS Duration: 78 QT Interval:  300 QTC Calculation: 447 R Axis:   3 Text Interpretation: Sinus tachycardia Low voltage, precordial leads Borderline  T abnormalities, diffuse leads Baseline wander in lead(s) V2 Rate is faster Confirmed by Paula Libra ([id number]) on 09/23/2022 6:48:01 AM  Radiology DG Chest 2 View  Result Date: 09/23/2022 CLINICAL DATA:  77 year old female with suspected sepsis. EXAM: CHEST - 2 VIEW COMPARISON:  Chest radiographs 07/23/2022 and earlier. FINDINGS: Low lung volumes, similar to but progressed from multiple previous exams. Mediastinal contours remain normal. Visualized tracheal air column is within normal limits. No pneumothorax, pulmonary edema, pleural effusion or confluent lung opacity. No acute osseous abnormality identified. Negative visible bowel gas. Stable cholecystectomy clips. IMPRESSION: Low lung volumes, otherwise no acute cardiopulmonary abnormality. Electronically Signed   By: Odessa Fleming M.D.   On: 09/23/2022 07:29    Procedures Procedures    Medications Ordered in ED Medications  sodium chloride 0.9 % bolus 1,000 mL (1,000 mLs Intravenous New Bag/Given 09/23/22 0749)  HYDROmorphone (DILAUDID) injection 0.5 mg (0.5 mg Intravenous Given 09/23/22 0749)    ED Course/ Medical Decision Making/ A&P                             Medical Decision Making Amount and/or Complexity of Data Reviewed Labs: ordered. Radiology: ordered.  Risk Prescription drug management.   77 year old female presents emergency department with cough and fatigue.  She is tachycardic on arrival, tachypneic and short of breath.  She desaturates into the high 80s with conversation  on room air.  EKG shows sinus tachycardia.  Blood work shows a mild leukocytosis with new acute kidney injury.  Initial chest x-ray did not show any acute finding.  Main concern initially was possible PE with the tachycardia and hypoxia.  D-dimer was elevated, first troponin is negative.  After aggressive IV hydration repeat BMP still did not have an appropriate GFR for CT PE study.  For this reason noncontrast studies were done.  CT of the chest identifies bilateral pneumonia and CT of the abdomen pelvis shows no acute abnormality.  On reevaluation patient is significantly improved but continues to be tachycardic and hypoxic with conversation.  However comfortable at rest on room air.  Will plan for antibiotics and admission.  Patients evaluation and results requires admission for further treatment and care.  Spoke with hospitalist, reviewed patient's ED course and they accept admission.  Patient agrees with admission plan, offers no new complaints and is stable/unchanged at time of admit.        Final Clinical Impression(s) / ED Diagnoses Final diagnoses:  None    Rx / DC Orders ED Discharge Orders     None         Rozelle Logan, DO 09/23/22 1337

## 2022-09-23 NOTE — ED Notes (Signed)
[facility] called at 3:05pm for transport.

## 2022-09-23 NOTE — H&P (Signed)
History and Physical    Alicia Wallace ZOX:[id number] DOB: 12-07-1945 DOA: 09/23/2022  PCP: Pearline Cables, MD   Patient coming from: Home   Chief Complaint: Cough, malaise, left flank pain  HPI: Alicia Wallace is a pleasant 77 y.o. female with medical history significant for hypertension, hypothyroidism, and asthma who presents to the emergency department for evaluation of cough, malaise, and left flank pain.  Patient reports developing cough, congestion, and general malaise with loss of appetite roughly 1 week ago.  She was given doxycycline for this on 09/17/2022 but her symptoms have persisted.  She went on to develop severe pain in her left flank whenever she coughs.  She had some mild right lower extremity swelling recently which was new for her.  [facility] ED Course: Upon arrival to the ED, patient is found to be afebrile and saturating mid 90s on room air with tachypnea, tachycardia, and stable blood pressure.  CT chest is concerning for bilateral lower lobe pneumonia.  No acute findings noted on CT of the abdomen and pelvis.  Labs are most notable for creatinine 2.09, D-dimer 2.32, WBC 14,200, normal lactic acid, normal troponin, and slight elevation in transaminases.  Blood and urine cultures were collected and the patient was treated with Levaquin, 2 L of normal saline, and Dilaudid prior to being transferred to [facility] for admission.  Review of Systems:  All other systems reviewed and apart from HPI, are negative.  Past Medical History:  Diagnosis Date   Allergic rhinitis    Asthma    Hypertension    Hypothyroid     Past Surgical History:  Procedure Laterality Date   APPENDECTOMY     CHOLECYSTECTOMY     TONSILLECTOMY     TOTAL ABDOMINAL HYSTERECTOMY W/ BILATERAL SALPINGOOPHORECTOMY     WISDOM TOOTH EXTRACTION      Social History:   reports that she has never smoked. She has never used smokeless tobacco. She reports that she does not drink alcohol and does  not use drugs.  Allergies  Allergen Reactions   Influenza Vac Split Quad Other (See Comments)    Size of grapefruit red, swollen area at injection site  Leg cramps  Vaccine given was Fluarix Quadrivalent 2014/2015 formula by [employer]   Penicillins Hives    Has patient had a PCN reaction causing immediate rash, facial/tongue/throat swelling, SOB or lightheadedness with hypotension: yes Has patient had a PCN reaction causing severe rash involving mucus membranes or skin necrosis: no Has patient had a PCN reaction that required hospitalization: no Has patient had a PCN reaction occurring within the last 10 years: no If all of the above answers are "NO", then may proceed with Cephalosporin use.    Sulfonamide Derivatives Hives    Family History  Problem Relation Age of Onset   Kidney disease Mother    Heart disease Father    Cancer Father        bladder   Asthma Father    Allergies Father      Prior to Admission medications   Medication Sig Start Date End Date Taking? Authorizing Provider  albuterol (VENTOLIN HFA) 108 (90 Base) MCG/ACT inhaler TAKE 2 PUFFS BY MOUTH EVERY 6 HOURS AS NEEDED FOR WHEEZE OR SHORTNESS OF BREATH 08/18/22   Copland, Gwenlyn Found, MD  Calcium Citrate-Vitamin D (CITRUS CALCIUM 1500 + D PO) Take 1 tablet by mouth daily.     [provider]  Cholecalciferol (VITAMIN D3) 2000 UNITS TABS Take 2,000 Units  by mouth daily.     [provider]  doxycycline (VIBRAMYCIN) 100 MG capsule Take 1 capsule (100 mg total) by mouth 2 (two) times daily. 09/19/22   Copland, Gwenlyn Found, MD  estradiol (CLIMARA - DOSED IN MG/24 HR) 0.025 mg/24hr patch APPLY 1 PATCH ONCE WEEKLY 02/19/17   [provider]  levocetirizine (XYZAL) 5 MG tablet Take 1 tablet every day in the evening. 03/26/22   Copland, Gwenlyn Found, MD  levothyroxine (SYNTHROID) 100 MCG tablet Take 1 tablet (100 mcg total) by mouth daily before breakfast. 05/28/22   Copland, Gwenlyn Found, MD  losartan (COZAAR)  100 MG tablet TAKE ONE TABLET BY MOUTH DAILY FOR BLOOD PRESSURE 04/21/22   Copland, Gwenlyn Found, MD  metoprolol tartrate (LOPRESSOR) 50 MG tablet Take 1 tablet (50 mg total) by mouth 2 (two) times daily. 03/26/22   Copland, Gwenlyn Found, MD  nortriptyline (PAMELOR) 10 MG capsule Take 2 at bedtime as needed 09/05/16   Copland, Gwenlyn Found, MD  predniSONE (DELTASONE) 20 MG tablet Take 40 mg by mouth daily for 3 days, then 20 mg daily for 3 days 07/23/22   Copland, Gwenlyn Found, MD  promethazine-dextromethorphan (PROMETHAZINE-DM) 6.25-15 MG/5ML syrup TAKE FOUR TIMES DAILY AS NEEDED FOR COUGH 09/19/22   Copland, Gwenlyn Found, MD  simvastatin (ZOCOR) 20 MG tablet Take 1 tablet (20 mg total) by mouth at bedtime. 04/16/22   Copland, Gwenlyn Found, MD  triamcinolone cream (KENALOG) 0.1 % Apply topically 2 (two) times daily as needed. 08/04/22   Copland, Gwenlyn Found, MD  zolpidem (AMBIEN) 10 MG tablet Take 1/2 tablet at bedtime as needed for sleep. May take 1 tablet if necessary 08/27/16   CoplandGwenlyn Found, MD    Physical Exam: Vitals:   09/23/22 1605 09/23/22 1606 09/23/22 1618 09/23/22 1710  BP: (!) 143/74   (!) 154/88  Pulse: (!) 120 (!) 120  (!) 128  Resp: (!) 24   20  Temp:   98.4 F (36.9 C) 97.6 F (36.4 C)  TempSrc:   Oral Oral  SpO2: 98% 98%  100%  Weight:      Height:         Constitutional: NAD, calm  Eyes: PERTLA, lids and conjunctivae normal ENMT: Mucous membranes are moist. Posterior pharynx clear of any exudate or lesions.   Neck: supple, no masses  Respiratory: no wheezing. No accessory muscle use.  Cardiovascular: S1 & S2 heard, regular rate and rhythm. Trace pedal edema.  Abdomen: No distension, no tenderness, soft. Bowel sounds active.  Musculoskeletal: no clubbing / cyanosis. No joint deformity upper and lower extremities.   Skin: no significant rashes, lesions, ulcers. Warm, dry, well-perfused. Neurologic: CN 2-12 grossly intact. Moving all extremities. Alert and oriented.  Psychiatric:  Pleasant. Cooperative.    Labs and Imaging on Admission: I have personally reviewed following labs and imaging studies  CBC: Recent Labs  Lab 09/23/22 0651  WBC 14.2*  NEUTROABS 10.2*  HGB 11.5*  HCT 34.6*  MCV 89.2  PLT 417*   Basic Metabolic Panel: Recent Labs  Lab 09/23/22 0651 09/23/22 1036  NA 136 136  K 3.5 3.8  CL 101 107  CO2 23 20*  GLUCOSE 139* 100*  BUN 29* 26*  CREATININE 2.09* 1.84*  CALCIUM 8.6* 7.7*   GFR: Estimated Creatinine Clearance: 21.1 mL/min (A) (by C-G formula based on SCr of 1.84 mg/dL (H)). Liver Function Tests: Recent Labs  Lab 09/23/22 0651  AST 43*  ALT 45*  ALKPHOS 126  BILITOT  0.4  PROT 7.2  ALBUMIN 3.0*   No results for input(s): "LIPASE", "AMYLASE" in the last 168 hours. No results for input(s): "AMMONIA" in the last 168 hours. Coagulation Profile: Recent Labs  Lab 09/23/22 0651  INR 1.0   Cardiac Enzymes: No results for input(s): "CKTOTAL", "CKMB", "CKMBINDEX", "TROPONINI" in the last 168 hours. BNP (last 3 results) No results for input(s): "PROBNP" in the last 8760 hours. HbA1C: No results for input(s): "HGBA1C" in the last 72 hours. CBG: No results for input(s): "GLUCAP" in the last 168 hours. Lipid Profile: No results for input(s): "CHOL", "HDL", "LDLCALC", "TRIG", "CHOLHDL", "LDLDIRECT" in the last 72 hours. Thyroid Function Tests: No results for input(s): "TSH", "T4TOTAL", "FREET4", "T3FREE", "THYROIDAB" in the last 72 hours. Anemia Panel: No results for input(s): "VITAMINB12", "FOLATE", "FERRITIN", "TIBC", "IRON", "RETICCTPCT" in the last 72 hours. Urine analysis:    Component Value Date/Time   COLORURINE STRAW (A) 09/23/2022 0652   APPEARANCEUR HAZY (A) 09/23/2022 0652   LABSPEC <=1.005 09/23/2022 0652   PHURINE 5.5 09/23/2022 0652   GLUCOSEU NEGATIVE 09/23/2022 0652   HGBUR TRACE (A) 09/23/2022 0652   BILIRUBINUR NEGATIVE 09/23/2022 0652   KETONESUR NEGATIVE 09/23/2022 0652   PROTEINUR NEGATIVE  09/23/2022 0652   NITRITE POSITIVE (A) 09/23/2022 0652   LEUKOCYTESUR SMALL (A) 09/23/2022 0652   Sepsis Labs: @LABRCNTIP (procalcitonin:4,lacticidven:4) ) Recent Results (from the past 240 hour(s))  Resp panel by RT-PCR (RSV, Flu A&B, Covid) Anterior Nasal Swab     Status: None   Collection Time: 09/23/22  2:17 PM   Specimen: Anterior Nasal Swab  Result Value Ref Range Status   SARS Coronavirus 2 by RT PCR NEGATIVE NEGATIVE Final    Comment: (NOTE) SARS-CoV-2 target nucleic acids are NOT DETECTED.  The SARS-CoV-2 RNA is generally detectable in upper respiratory specimens during the acute phase of infection. The lowest concentration of SARS-CoV-2 viral copies this assay can detect is 138 copies/mL. A negative result does not preclude SARS-Cov-2 infection and should not be used as the sole basis for treatment or other patient management decisions. A negative result may occur with  improper specimen collection/handling, submission of specimen other than nasopharyngeal swab, presence of viral mutation(s) within the areas targeted by this assay, and inadequate number of viral copies(<138 copies/mL). A negative result must be combined with clinical observations, patient history, and epidemiological information. The expected result is Negative.  Fact Sheet for Patients:  BloggerCourse.com  Fact Sheet for Healthcare Providers:  SeriousBroker.it  This test is no t yet approved or cleared by the [place] FDA and  has been authorized for detection and/or diagnosis of SARS-CoV-2 by FDA under an Emergency Use Authorization (EUA). This EUA will remain  in effect (meaning this test can be used) for the duration of the COVID-19 declaration under Section 564(b)(1) of the Act, 21 U.S.C.section 360bbb-3(b)(1), unless the authorization is terminated  or revoked sooner.       Influenza A by PCR NEGATIVE NEGATIVE Final   Influenza B by PCR  NEGATIVE NEGATIVE Final    Comment: (NOTE) The Xpert Xpress SARS-CoV-2/FLU/RSV plus assay is intended as an aid in the diagnosis of influenza from Nasopharyngeal swab specimens and should not be used as a sole basis for treatment. Nasal washings and aspirates are unacceptable for Xpert Xpress SARS-CoV-2/FLU/RSV testing.  Fact Sheet for Patients: BloggerCourse.com  Fact Sheet for Healthcare Providers: SeriousBroker.it  This test is not yet approved or cleared by the [place] FDA and has been authorized for detection and/or diagnosis  of SARS-CoV-2 by FDA under an Emergency Use Authorization (EUA). This EUA will remain in effect (meaning this test can be used) for the duration of the COVID-19 declaration under Section 564(b)(1) of the Act, 21 U.S.C. section 360bbb-3(b)(1), unless the authorization is terminated or revoked.     Resp Syncytial Virus by PCR NEGATIVE NEGATIVE Final    Comment: (NOTE) Fact Sheet for Patients: BloggerCourse.com  Fact Sheet for Healthcare Providers: SeriousBroker.it  This test is not yet approved or cleared by the [place] FDA and has been authorized for detection and/or diagnosis of SARS-CoV-2 by FDA under an Emergency Use Authorization (EUA). This EUA will remain in effect (meaning this test can be used) for the duration of the COVID-19 declaration under Section 564(b)(1) of the Act, 21 U.S.C. section 360bbb-3(b)(1), unless the authorization is terminated or revoked.  Performed at [facility], [address] [place], [place] [address]      Radiological Exams on Admission: CT Renal Stone Study  Result Date: 09/23/2022 CLINICAL DATA:  Abdominal/flank pain, stone suspected.  Lobe EXAM: CT ABDOMEN AND PELVIS WITHOUT CONTRAST TECHNIQUE: Multidetector CT imaging of the abdomen and pelvis was performed following the standard  protocol without IV contrast. RADIATION DOSE REDUCTION: This exam was performed according to the departmental dose-optimization program which includes automated exposure control, adjustment of the mA and/or kV according to patient size and/or use of iterative reconstruction technique. COMPARISON:  CT examination dated November 09, 2015 FINDINGS: Lower chest: Bilateral lower lobe bronchial thickening and ground-glass/patchy lung opacities concerning for bilateral pneumonia. Hepatobiliary: No focal liver abnormality is seen. Status post cholecystectomy. No biliary dilatation. Pancreas: Generalized pancreatic atrophy. No pancreatic ductal dilatation or surrounding inflammatory changes. Spleen: Normal in size without focal abnormality. Adrenals/Urinary Tract: Adrenal glands are unremarkable. 3 mm nonobstructing calculus in the lower pole of the right kidney. No hydronephrosis or ureteral calculus. Nonspecific bilateral perinephric fat stranding. Bladder is unremarkable. Stomach/Bowel: Stomach is within normal limits. Appendix appears normal. Sigmoid colonic diverticulosis without evidence of acute diverticulitis. Vascular/Lymphatic: No significant vascular findings are present. No enlarged abdominal or pelvic lymph nodes. Reproductive: Status post hysterectomy. No adnexal masses. Other: No abdominal wall hernia or abnormality. No abdominopelvic ascites. 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 No acute osseous abnormality. IMPRESSION: 1. Bilateral lower lobe bronchial thickening and ground-glass/patchy lung opacities concerning for bilateral pneumonia. 2. 3 mm nonobstructing calculus in the lower pole of the right kidney. No hydronephrosis or ureteral calculus. 3. Sigmoid colonic diverticulosis without evidence of acute diverticulitis. 4. Normal appendix. No evidence of bowel obstruction. 5. Multilevel degenerate disc disease with disc height  loss and subchondral sclerosis prominent at L4-L5. No acute osseous abnormality. Electronically Signed   By: Larose Hires D.O.   On: 09/23/2022 12:56   CT Chest Wo Contrast  Result Date: 09/23/2022 CLINICAL DATA:  Sinus infection since last Wednesday. Left lower quadrant pain with cough. EXAM: CT CHEST WITHOUT CONTRAST TECHNIQUE: Multidetector CT imaging of the chest was performed following the standard protocol without IV contrast. RADIATION DOSE REDUCTION: This exam was performed according to the departmental dose-optimization program which includes automated exposure control, adjustment of the mA and/or kV according to patient size and/or use of iterative reconstruction technique. COMPARISON:  Chest radiograph performed earlier on the same date. FINDINGS: Cardiovascular: No significant vascular findings. Normal heart size. No pericardial effusion. Mediastinum/Nodes: No enlarged mediastinal or axillary lymph nodes. Thyroid gland, trachea, and esophagus demonstrate no significant findings.  Lungs/Pleura: There are bilateral lower lobe bronchial thickening and peribronchovascular ground-glass and small patchy lung opacities concerning for bilateral pneumonia. No pleural effusion or pneumothorax. Upper Abdomen: No acute abnormality.  Cholecystectomy changes. Musculoskeletal: No chest wall mass or suspicious bone lesions identified. IMPRESSION: Bilateral lower lobe peribronchovascular ground-glass and small patchy lung opacities concerning for bilateral pneumonia. Follow-up examination to resolution is recommended. Electronically Signed   By: Larose Hires D.O.   On: 09/23/2022 12:49   DG Chest 2 View  Result Date: 09/23/2022 CLINICAL DATA:  77 year old female with suspected sepsis. EXAM: CHEST - 2 VIEW COMPARISON:  Chest radiographs 07/23/2022 and earlier. FINDINGS: Low lung volumes, similar to but progressed from multiple previous exams. Mediastinal contours remain normal. Visualized tracheal air column is  within normal limits. No pneumothorax, pulmonary edema, pleural effusion or confluent lung opacity. No acute osseous abnormality identified. Negative visible bowel gas. Stable cholecystectomy clips. IMPRESSION: Low lung volumes, otherwise no acute cardiopulmonary abnormality. Electronically Signed   By: Odessa Fleming M.D.   On: 09/23/2022 07:29    EKG: Independently reviewed. Sinus tachycardia, rate 133.   Assessment/Plan  1. Pneumonia  - Continue Levaquin, follow cultures, trend procalcitonin, continue supportive care    2. AKI  - SCr is 2.09 in ED, up from baseline of ~0.6  - No hydro on CT in ED, likely prerenal in setting of loss of appetite  - Continue IVF hydration, renally-dose medications, repeat chem panel in am   3. Hypertension  - Treat as-needed only for now in light of prerenal AKI    4. Hypothyroidism  - Continue Synthroid   5. Mild intermittent asthma  - Not in exacerbation on admission, continue as-needed albuterol    6. Elevated d-dimer - D-dimer is 2.32 in ED  - Check LE venous US and V/Q scan     DVT prophylaxis: sq heparin  Code Status: Full  Level of Care: Level of care: Progressive Family Communication: None present  Disposition Plan:  Patient is from: home  Anticipated d/c is to: home  Anticipated d/c date is: 09/27/22  Patient currently: Pending stable respiratory status, improved renal function  Consults called: none  Admission status: Inpatient     Briscoe Deutscher, MD [facility]  09/23/2022, 6:47 PM

## 2022-09-23 NOTE — ED Notes (Signed)
[facility] at bedside 

## 2022-09-23 NOTE — ED Notes (Signed)
Patient ambulated to restroom with no assistance needed.

## 2022-09-23 NOTE — ED Triage Notes (Addendum)
Pt reports sinus infection since last Wednesday and is on Doxy for it. Pt reports 10/10 LLQ pain with cough. Pt states pain started constantly 2 days ago. Pt also reports prior to this LLQ pain she had CP as well with coughing. Pt states she cannot tolerate LLQ pain anymore. Pt with congested cough and O2 drops down to 91-92% when speaking or coughing. Pt tachypneic and tachycardic during triage.

## 2022-09-24 ENCOUNTER — Inpatient Hospital Stay (HOSPITAL_COMMUNITY): Payer: Medicare HMO

## 2022-09-24 ENCOUNTER — Telehealth: Payer: Self-pay | Admitting: Family Medicine

## 2022-09-24 DIAGNOSIS — M7989 Other specified soft tissue disorders: Secondary | ICD-10-CM | POA: Diagnosis not present

## 2022-09-24 DIAGNOSIS — J189 Pneumonia, unspecified organism: Secondary | ICD-10-CM | POA: Diagnosis not present

## 2022-09-24 LAB — STREP PNEUMONIAE URINARY ANTIGEN: Strep Pneumo Urinary Antigen: NEGATIVE

## 2022-09-24 LAB — BASIC METABOLIC PANEL
Anion gap: 8 (ref 5–15)
BUN: 23 mg/dL (ref 8–23)
CO2: 22 mmol/L (ref 22–32)
Calcium: 8.5 mg/dL — ABNORMAL LOW (ref 8.9–10.3)
Chloride: 108 mmol/L (ref 98–111)
Creatinine, Ser: 1.45 mg/dL — ABNORMAL HIGH (ref 0.44–1.00)
GFR, Estimated: 38 mL/min — ABNORMAL LOW (ref >60)
Glucose, Bld: 107 mg/dL — ABNORMAL HIGH (ref 70–99)
Potassium: 3.8 mmol/L (ref 3.5–5.1)
Sodium: 138 mmol/L (ref 135–145)

## 2022-09-24 LAB — CBC
HCT: 29.5 % — ABNORMAL LOW (ref 36.0–46.0)
Hemoglobin: 9.7 g/dL — ABNORMAL LOW (ref 12.0–15.0)
MCH: 30.5 pg (ref 26.0–34.0)
MCHC: 32.9 g/dL (ref 30.0–36.0)
MCV: 92.8 fL (ref 80.0–100.0)
Platelets: 340 10*3/uL (ref 150–400)
RBC: 3.18 MIL/uL — ABNORMAL LOW (ref 3.87–5.11)
RDW: 13.5 % (ref 11.5–15.5)
WBC: 10.4 10*3/uL (ref 4.0–10.5)
nRBC: 0 % (ref 0.0–0.2)

## 2022-09-24 LAB — URINE CULTURE

## 2022-09-24 LAB — HEPATIC FUNCTION PANEL
ALT: 33 U/L (ref 0–44)
AST: 26 U/L (ref 15–41)
Albumin: 2.4 g/dL — ABNORMAL LOW (ref 3.5–5.0)
Alkaline Phosphatase: 88 U/L (ref 38–126)
Bilirubin, Direct: <0.1 mg/dL (ref 0.0–0.2)
Total Bilirubin: 0.3 mg/dL (ref 0.3–1.2)
Total Protein: 6 g/dL — ABNORMAL LOW (ref 6.5–8.1)

## 2022-09-24 LAB — MAGNESIUM: Magnesium: 1.8 mg/dL (ref 1.7–2.4)

## 2022-09-24 LAB — PROCALCITONIN: Procalcitonin: <0.1 ng/mL

## 2022-09-24 MED ORDER — ORAL CARE MOUTH RINSE: 15.0000 mL | OROMUCOSAL | Status: DC | PRN

## 2022-09-24 MED ORDER — TECHNETIUM TO 99M ALBUMIN AGGREGATED
4.1000 | Freq: Once | INTRAVENOUS | Status: AC
  Administered 2022-09-24: 4.1 via INTRAVENOUS

## 2022-09-24 MED ORDER — METOPROLOL TARTRATE 50 MG PO TABS
50.0000 mg | ORAL_TABLET | Freq: Two times a day (BID) | ORAL | Status: DC
  Administered 2022-09-24 – 2022-09-25 (×3): 50 mg via ORAL
  Filled 2022-09-24 (×3): qty 1

## 2022-09-24 NOTE — Telephone Encounter (Signed)
Called her back- she is in the hospital and is making progress, she hopes to go home tomorrow

## 2022-09-24 NOTE — Progress Notes (Signed)
PROGRESS NOTE    Alicia Wallace  ZOX:[id number] DOB: 04-07-1946 DOA: 09/23/2022 PCP: Pearline Cables, MD  Chief Complaint  Patient presents with   Abdominal Pain    Brief Narrative:    Alicia Wallace is Alicia Wallace pleasant 77 y.o. female with medical history significant for hypertension, hypothyroidism, and asthma who presents to the emergency department for evaluation of cough, malaise, and left flank pain.   Patient reports developing cough, congestion, and general malaise with loss of appetite roughly 1 week ago.  She was given doxycycline for this on 09/17/2022 but her symptoms have persisted.  She went on to develop severe pain in her left flank whenever she coughs.  She had some mild right lower extremity swelling recently which was new for her.   [facility] ED Course: Upon arrival to the ED, patient is found to be afebrile and saturating mid 90s on room air with tachypnea, tachycardia, and stable blood pressure.  CT chest is concerning for bilateral lower lobe pneumonia.  No acute findings noted on CT of the abdomen and pelvis.  Labs are most notable for creatinine 2.09, D-dimer 2.32, WBC 14,200, normal lactic acid, normal troponin, and slight elevation in transaminases.   Blood and urine cultures were collected and the patient was treated with Levaquin, 2 L of normal saline, and Dilaudid prior to being transferred to [facility] for admission.  Assessment & Plan:   Principal Problem:   Pneumonia Active Problems:   Hypothyroidism   Asthma, mild intermittent   HTN (hypertension)   AKI (acute kidney injury) (HCC)  1. Pneumonia  - CT with bilateral lower lobe bronchial thickening and ground glass patchy lung opacities concerning for bilateral pneumonia -> needs follow up to resolution  - Continue Levaquin, follow cultures, negative urine strep, pending urine legionella, trend procalcitonin (less than 0.10), continue supportive care     2. AKI  - SCr is 2.09 in ED, up from baseline  of ~0.6  - improved today, will continue to trend with IVF - No hydro on CT in ED, likely prerenal in setting of loss of appetite  - Continue IVF hydration, renally-dose medications, repeat chem panel in am    3. Hypertension  - metoprolol - losartan on hold with AKI   4. Hypothyroidism  - Continue Synthroid    5. Mild intermittent asthma  - Not in exacerbation on admission, continue as-needed albuterol     6. Elevated d-dimer - D-dimer is 2.32 in ED  - Check LE venous US (negative for DVT) and V/Q scan (pending)    DVT prophylaxis: heparin Code Status: full Family Communication: none Disposition:   Status is: Inpatient Remains inpatient appropriate because: need for IV abx   Consultants:  none  Procedures:  LE [place] Summary:  BILATERAL:  - No evidence of deep vein thrombosis seen in the lower extremities,  bilaterally.  -No evidence of popliteal cyst, bilaterally.   Antimicrobials:  Anti-infectives (From admission, onward)    Start     Dose/Rate Route Frequency Ordered Stop   09/25/22 1400  levofloxacin (LEVAQUIN) IVPB 750 mg        750 mg 100 mL/hr over 90 Minutes Intravenous Every 48 hours 09/23/22 1813     09/23/22 1330  levofloxacin (LEVAQUIN) IVPB 750 mg        750 mg 100 mL/hr over 90 Minutes Intravenous  Once 09/23/22 1328 09/24/22 0740       Subjective: No complaints  Objective: Vitals:   09/24/22  0500 09/24/22 0800 09/24/22 0907 09/24/22 1321  BP:  (!) 149/92  (!) 152/87  Pulse:  (!) 112 (!) 117 95  Resp:  18  20  Temp:  98 F (36.7 C)  97.7 F (36.5 C)  TempSrc:  Oral  Oral  SpO2:  98%  98%  Weight: 65.7 kg     Height:        Intake/Output Summary (Last 24 hours) at 09/24/2022 1528 Last data filed at 09/24/2022 1000 Gross per 24 hour  Intake 948 ml  Output 200 ml  Net 748 ml   Filed Weights   09/23/22 0634 09/24/22 0500  Weight: 58.1 kg 65.7 kg    Examination:  General exam: Appears calm and comfortable  Respiratory system:  Clear to auscultation. Respiratory effort normal. Cardiovascular system: RRR Gastrointestinal system: Abdomen is nondistended, soft and nontender. Central nervous system: Alert and oriented. No focal neurological deficits. Extremities: no LEE   Data Reviewed: I have personally reviewed following labs and imaging studies  CBC: Recent Labs  Lab 09/23/22 0651 09/24/22 0417  WBC 14.2* 10.4  NEUTROABS 10.2*  --   HGB 11.5* 9.7*  HCT 34.6* 29.5*  MCV 89.2 92.8  PLT 417* 340    Basic Metabolic Panel: Recent Labs  Lab 09/23/22 0651 09/23/22 1036 09/24/22 0417  NA 136 136 138  K 3.5 3.8 3.8  CL 101 107 108  CO2 23 20* 22  GLUCOSE 139* 100* 107*  BUN 29* 26* 23  CREATININE 2.09* 1.84* 1.45*  CALCIUM 8.6* 7.7* 8.5*  MG  --   --  1.8    GFR: Estimated Creatinine Clearance: 28.4 mL/min (Alicia Wallace) (by C-G formula based on SCr of 1.45 mg/dL (H)).  Liver Function Tests: Recent Labs  Lab 09/23/22 0651 09/24/22 0417  AST 43* 26  ALT 45* 33  ALKPHOS 126 88  BILITOT 0.4 0.3  PROT 7.2 6.0*  ALBUMIN 3.0* 2.4*    CBG: No results for input(s): "GLUCAP" in the last 168 hours.   Recent Results (from the past 240 hour(s))  Urine Culture     Status: Abnormal (Preliminary result)   Collection Time: 09/23/22  6:25 AM   Specimen: Urine, Clean Catch  Result Value Ref Range Status   Specimen Description   Final    URINE, CLEAN CATCH Performed at [facility], [address] [place], [place] [address]    Special Requests   Final    NONE Performed at [facility], [address] [place], [place] [address]    Culture (Alicia Wallace)  Final    >=100,000 COLONIES/mL SERRATIA MARCESCENS SUSCEPTIBILITIES TO FOLLOW Performed at [facility], [address], [place], [place] [address]    Report Status PENDING  Incomplete  Culture, blood (Routine x 2)     Status: None (Preliminary result)   Collection Time: 09/23/22  6:50 AM   Specimen: BLOOD  Result Value Ref Range  Status   Specimen Description   Final    BLOOD RIGHT ANTECUBITAL Performed at [facility], [address] [place], [place] [address]    Special Requests   Final    Blood Culture adequate volume BOTTLES DRAWN AEROBIC AND ANAEROBIC Performed at [facility], [address] [place], [place] [address]    Culture   Final    NO GROWTH < 24 HOURS Performed at [facility], [address], [place], [place] [address]  Report Status PENDING  Incomplete  Culture, blood (Routine x 2)     Status: None (Preliminary result)   Collection Time: 09/23/22  6:55 AM   Specimen: BLOOD  Result Value Ref Range Status   Specimen Description   Final    BLOOD LEFT ANTECUBITAL Performed at [facility], [address] [place], [place] [address]    Special Requests   Final    Blood Culture adequate volume BOTTLES DRAWN AEROBIC AND ANAEROBIC Performed at [facility], [address] [place], [place] [address]    Culture   Final    NO GROWTH < 24 HOURS Performed at [facility], [address] [place], [place] [address]    Report Status PENDING  Incomplete  Resp panel by RT-PCR (RSV, Flu Benoit Meech&B, Covid) Anterior Nasal Swab     Status: None   Collection Time: 09/23/22  2:17 PM   Specimen: Anterior Nasal Swab  Result Value Ref Range Status   SARS Coronavirus 2 by RT PCR NEGATIVE NEGATIVE Final    Comment: (NOTE) SARS-CoV-2 target nucleic acids are NOT DETECTED.  The SARS-CoV-2 RNA is generally detectable in upper respiratory specimens during the acute phase of infection. The lowest concentration of SARS-CoV-2 viral copies this assay can detect is 138 copies/mL. Alicia Wallace negative result does not preclude SARS-Cov-2 infection and should not be used as the sole basis for treatment or other patient management decisions. Alicia Wallace negative result may occur with  improper specimen collection/handling, submission of specimen other than nasopharyngeal swab, presence  of viral mutation(s) within the areas targeted by this assay, and inadequate number of viral copies(<138 copies/mL). Alicia Wallace negative result must be combined with clinical observations, patient history, and epidemiological information. The expected result is Negative.  Fact Sheet for Patients:  BloggerCourse.com  Fact Sheet for Healthcare Providers:  SeriousBroker.it  This test is no t yet approved or cleared by the [place] FDA and  has been authorized for detection and/or diagnosis of SARS-CoV-2 by FDA under an Emergency Use Authorization (EUA). This EUA will remain  in effect (meaning this test can be used) for the duration of the COVID-19 declaration under Section 564(b)(1) of the Act, 21 U.S.C.section 360bbb-3(b)(1), unless the authorization is terminated  or revoked sooner.       Influenza Asti Mackley by PCR NEGATIVE NEGATIVE Final   Influenza B by PCR NEGATIVE NEGATIVE Final    Comment: (NOTE) The Xpert Xpress SARS-CoV-2/FLU/RSV plus assay is intended as an aid in the diagnosis of influenza from Nasopharyngeal swab specimens and should not be used as Marquin Patino sole basis for treatment. Nasal washings and aspirates are unacceptable for Xpert Xpress SARS-CoV-2/FLU/RSV testing.  Fact Sheet for Patients: BloggerCourse.com  Fact Sheet for Healthcare Providers: SeriousBroker.it  This test is not yet approved or cleared by the [place] FDA and has been authorized for detection and/or diagnosis of SARS-CoV-2 by FDA under an Emergency Use Authorization (EUA). This EUA will remain in effect (meaning this test can be used) for the duration of the COVID-19 declaration under Section 564(b)(1) of the Act, 21 U.S.C. section 360bbb-3(b)(1), unless the authorization is terminated or revoked.     Resp Syncytial Virus by PCR NEGATIVE NEGATIVE Final    Comment: (NOTE) Fact Sheet for  Patients: BloggerCourse.com  Fact Sheet for Healthcare Providers: SeriousBroker.it  This test is not yet approved or cleared by the [place] FDA and has been authorized for detection and/or diagnosis of SARS-CoV-2 by FDA under an  Emergency Use Authorization (EUA). This EUA will remain in effect (meaning this test can be used) for the duration of the COVID-19 declaration under Section 564(b)(1) of the Act, 21 U.S.C. section 360bbb-3(b)(1), unless the authorization is terminated or revoked.  Performed at [facility], [address] [place], [place] [address]          Radiology Studies: VAS [place] LOWER EXTREMITY VENOUS (DVT)  Result Date: 09/24/2022  Lower Venous DVT Study Patient Name:  Alicia PASKE  Date of Exam:   09/24/2022 Medical Rec #: [id number]       Accession #:    [id number] Date of Birth: 06-Dec-1945       Patient Gender: F Patient Age:   6 years Exam Location:  [facility] Procedure:      VAS [place] LOWER EXTREMITY VENOUS (DVT) Referring Phys: TIMOTHY OPYD --------------------------------------------------------------------------------  Indications: Elevated D-dimer (2.32).  Comparison Study: No previous exams Performing Technologist: Jody Hill RVT, RDMS  Examination Guidelines: Esparanza Krider complete evaluation includes B-mode imaging, spectral Doppler, color Doppler, and power Doppler as needed of all accessible portions of each vessel. Bilateral testing is considered an integral part of Sherrod Toothman complete examination. Limited examinations for reoccurring indications may be performed as noted. The reflux portion of the exam is performed with the patient in reverse Trendelenburg.  +---------+---------------+---------+-----------+----------+--------------+ RIGHT    CompressibilityPhasicitySpontaneityPropertiesThrombus Aging +---------+---------------+---------+-----------+----------+--------------+ CFV      Full            Yes      Yes                                 +---------+---------------+---------+-----------+----------+--------------+ SFJ      Full                                                        +---------+---------------+---------+-----------+----------+--------------+ FV Prox  Full           Yes      Yes                                 +---------+---------------+---------+-----------+----------+--------------+ FV Mid   Full           Yes      Yes                                 +---------+---------------+---------+-----------+----------+--------------+ FV DistalFull           Yes      Yes                                 +---------+---------------+---------+-----------+----------+--------------+ PFV      Full                                                        +---------+---------------+---------+-----------+----------+--------------+ POP      Full  Yes      Yes                                 +---------+---------------+---------+-----------+----------+--------------+ PTV      Full                                                        +---------+---------------+---------+-----------+----------+--------------+ PERO     Full                                                        +---------+---------------+---------+-----------+----------+--------------+   +---------+---------------+---------+-----------+----------+--------------+ LEFT     CompressibilityPhasicitySpontaneityPropertiesThrombus Aging +---------+---------------+---------+-----------+----------+--------------+ CFV      Full           Yes      Yes                                 +---------+---------------+---------+-----------+----------+--------------+ SFJ      Full                                                        +---------+---------------+---------+-----------+----------+--------------+ FV Prox  Full           Yes      Yes                                  +---------+---------------+---------+-----------+----------+--------------+ FV Mid   Full           Yes      Yes                                 +---------+---------------+---------+-----------+----------+--------------+ FV DistalFull           Yes      Yes                                 +---------+---------------+---------+-----------+----------+--------------+ PFV      Full                                                        +---------+---------------+---------+-----------+----------+--------------+ POP      Full           Yes      Yes                                 +---------+---------------+---------+-----------+----------+--------------+ PTV      Full                                                        +---------+---------------+---------+-----------+----------+--------------+  PERO     Full                                                        +---------+---------------+---------+-----------+----------+--------------+    Summary: BILATERAL: - No evidence of deep vein thrombosis seen in the lower extremities, bilaterally. -No evidence of popliteal cyst, bilaterally.   *See table(s) above for measurements and observations.    Preliminary    CT Renal Stone Study  Result Date: 09/23/2022 CLINICAL DATA:  Abdominal/flank pain, stone suspected.  Lobe EXAM: CT ABDOMEN AND PELVIS WITHOUT CONTRAST TECHNIQUE: Multidetector CT imaging of the abdomen and pelvis was performed following the standard protocol without IV contrast. RADIATION DOSE REDUCTION: This exam was performed according to the departmental dose-optimization program which includes automated exposure control, adjustment of the mA and/or kV according to patient size and/or use of iterative reconstruction technique. COMPARISON:  CT examination dated November 09, 2015 FINDINGS: Lower chest: Bilateral lower lobe bronchial thickening and ground-glass/patchy lung opacities concerning for bilateral pneumonia.  Hepatobiliary: No focal liver abnormality is seen. Status post cholecystectomy. No biliary dilatation. Pancreas: Generalized pancreatic atrophy. No pancreatic ductal dilatation or surrounding inflammatory changes. Spleen: Normal in size without focal abnormality. Adrenals/Urinary Tract: Adrenal glands are unremarkable. 3 mm nonobstructing calculus in the lower pole of the right kidney. No hydronephrosis or ureteral calculus. Nonspecific bilateral perinephric fat stranding. Bladder is unremarkable. Stomach/Bowel: Stomach is within normal limits. Appendix appears normal. Sigmoid colonic diverticulosis without evidence of acute diverticulitis. Vascular/Lymphatic: No significant vascular findings are present. No enlarged abdominal or pelvic lymph nodes. Reproductive: Status post hysterectomy. No adnexal masses. Other: No abdominal wall hernia or abnormality. No abdominopelvic ascites. 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 No acute osseous abnormality. IMPRESSION: 1. Bilateral lower lobe bronchial thickening and ground-glass/patchy lung opacities concerning for bilateral pneumonia. 2. 3 mm nonobstructing calculus in the lower pole of the right kidney. No hydronephrosis or ureteral calculus. 3. Sigmoid colonic diverticulosis without evidence of acute diverticulitis. 4. Normal appendix. No evidence of bowel obstruction. 5. 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 No acute osseous abnormality. Electronically Signed   By: Larose Hires D.O.   On: 09/23/2022 12:56   CT Chest Wo Contrast  Result Date: 09/23/2022 CLINICAL DATA:  Sinus infection since last Wednesday. Left lower quadrant pain with cough. EXAM: CT CHEST WITHOUT CONTRAST TECHNIQUE: Multidetector CT imaging of the chest was performed following the standard protocol without IV contrast. RADIATION DOSE REDUCTION: This exam  was performed according to the departmental dose-optimization program which includes automated exposure control, adjustment of the mA and/or kV according to patient size and/or use of iterative reconstruction technique. COMPARISON:  Chest radiograph performed earlier on the same date. FINDINGS: Cardiovascular: No significant vascular findings. Normal heart size. No pericardial effusion. Mediastinum/Nodes: No enlarged mediastinal or axillary lymph nodes. Thyroid gland, trachea, and esophagus demonstrate no significant findings. Lungs/Pleura: There are bilateral lower lobe bronchial thickening and peribronchovascular ground-glass and small patchy lung opacities concerning for bilateral pneumonia. No pleural effusion or pneumothorax. Upper Abdomen: No acute abnormality.  Cholecystectomy changes. Musculoskeletal: No chest wall mass or suspicious bone lesions identified. IMPRESSION: Bilateral lower lobe peribronchovascular ground-glass and small patchy lung opacities concerning for bilateral pneumonia. Follow-up examination to resolution is recommended. Electronically  Signed   By: Larose Hires D.O.   On: 09/23/2022 12:49   DG Chest 2 View  Result Date: 09/23/2022 CLINICAL DATA:  77 year old female with suspected sepsis. EXAM: CHEST - 2 VIEW COMPARISON:  Chest radiographs 07/23/2022 and earlier. FINDINGS: Low lung volumes, similar to but progressed from multiple previous exams. Mediastinal contours remain normal. Visualized tracheal air column is within normal limits. No pneumothorax, pulmonary edema, pleural effusion or confluent lung opacity. No acute osseous abnormality identified. Negative visible bowel gas. Stable cholecystectomy clips. IMPRESSION: Low lung volumes, otherwise no acute cardiopulmonary abnormality. Electronically Signed   By: Odessa Fleming M.D.   On: 09/23/2022 07:29        Scheduled Meds:  heparin  5,000 Units Subcutaneous Q8H   levothyroxine  100 mcg Oral Q0600   metoprolol tartrate  50 mg  Oral BID   sodium chloride flush  3 mL Intravenous Q12H   Continuous Infusions:  sodium chloride 10 mL/hr at 09/23/22 1407   [START ON 09/25/2022] levofloxacin (LEVAQUIN) IV       LOS: 1 day    Time spent: over 30 min    Lacretia Nicks, MD [facility]   To contact the attending provider between 7A-7P or the covering provider during after hours 7P-7A, please log into the web site [email or web address] and access using universal [place] password for that web site. If you do not have the password, please call the hospital operator.  09/24/2022, 3:28 PM

## 2022-09-24 NOTE — Telephone Encounter (Signed)
Pt called stating that Dr. Patsy Lager had given her a call and wanted to let her know that she has been admitted to the hospital with pnemonia in both lungs.

## 2022-09-24 NOTE — Progress Notes (Signed)
BLE venous duplex has been completed.   Results can be found under chart review under CV PROC. 09/24/2022 10:06 AM Harm Jou RVT, RDMS

## 2022-09-25 DIAGNOSIS — J189 Pneumonia, unspecified organism: Secondary | ICD-10-CM | POA: Diagnosis not present

## 2022-09-25 LAB — CBC
HCT: 30.7 % — ABNORMAL LOW (ref 36.0–46.0)
Hemoglobin: 9.9 g/dL — ABNORMAL LOW (ref 12.0–15.0)
MCH: 30.3 pg (ref 26.0–34.0)
MCHC: 32.2 g/dL (ref 30.0–36.0)
MCV: 93.9 fL (ref 80.0–100.0)
Platelets: 382 10*3/uL (ref 150–400)
RBC: 3.27 MIL/uL — ABNORMAL LOW (ref 3.87–5.11)
RDW: 13.5 % (ref 11.5–15.5)
WBC: 11.8 10*3/uL — ABNORMAL HIGH (ref 4.0–10.5)
nRBC: 0 % (ref 0.0–0.2)

## 2022-09-25 LAB — URINE CULTURE: Culture: >=100000 — AB

## 2022-09-25 LAB — BASIC METABOLIC PANEL
Anion gap: 8 (ref 5–15)
BUN: 23 mg/dL (ref 8–23)
CO2: 22 mmol/L (ref 22–32)
Calcium: 8.6 mg/dL — ABNORMAL LOW (ref 8.9–10.3)
Chloride: 108 mmol/L (ref 98–111)
Creatinine, Ser: 1 mg/dL (ref 0.44–1.00)
GFR, Estimated: 59 mL/min — ABNORMAL LOW (ref >60)
Glucose, Bld: 106 mg/dL — ABNORMAL HIGH (ref 70–99)
Potassium: 4 mmol/L (ref 3.5–5.1)
Sodium: 138 mmol/L (ref 135–145)

## 2022-09-25 MED ORDER — LEVOFLOXACIN 750 MG PO TABS: 750.0000 mg | ORAL_TABLET | ORAL | 0 refills | Status: AC

## 2022-09-25 MED ORDER — ESTRADIOL 0.025 MG/24HR TD PTWK: 0.0250 mg | MEDICATED_PATCH | TRANSDERMAL | Status: DC

## 2022-09-25 NOTE — Progress Notes (Signed)
SATURATION QUALIFICATIONS: (This note is used to comply with regulatory documentation for home oxygen)  Patient Saturations on Room Air at Rest = 96%  Patient Saturations on Room Air while Ambulating = 95%  Patient Saturations on 0 Liters of oxygen while Ambulating = 95%  Please briefly explain why patient needs home oxygen: no O2 needed

## 2022-09-25 NOTE — Progress Notes (Signed)
AVS given to patient and explained at the bedside. Medications and follow up appointments have been explained with pt verbalizing understanding.  

## 2022-09-25 NOTE — Discharge Summary (Signed)
Physician Discharge Summary  [name] WJX:[id number] DOB: Feb 04, 1946 DOA: 09/23/2022  PCP: Pearline Cables, MD  Admit date: 09/23/2022 Discharge date: 09/25/2022  Time spent: 40 minutes  Recommendations for Outpatient Follow-up:  Follow outpatient CBC/CMP Follow repeat chest imaging outpatient (follow up to resolution recommended of abnormal CT findings)  Discharge Diagnoses:  Principal Problem:   Pneumonia Active Problems:   Hypothyroidism   Asthma, mild intermittent   HTN (hypertension)   AKI (acute kidney injury) (HCC)   Community acquired pneumonia   Discharge Condition: stable  Diet recommendation: heart healthy  Filed Weights   09/23/22 0634 09/24/22 0500  Weight: 58.1 kg 65.7 kg    History of present illness:   Alicia Wallace is Alicia Wallace pleasant 77 y.o. female with medical history significant for hypertension, hypothyroidism, and asthma who presents to the emergency department for evaluation of cough, malaise, and left flank pain.   Patient reports developing cough, congestion, and general malaise with loss of appetite roughly 1 week ago.  She was given doxycycline for this on 09/17/2022 but her symptoms have persisted.  She went on to develop severe pain in her left flank whenever she coughs.    She was diagnosed with pneumonia.  She's improved on antibiotics.   See below for additional details   Hospital Course:  Assessment and Plan:  1. Pneumonia  - CT with bilateral lower lobe bronchial thickening and ground glass patchy lung opacities concerning for bilateral pneumonia -> needs follow up to resolution  - Continue Levaquin, follow cultures, negative urine strep, pending urine legionella, trend procalcitonin (less than 0.10), continue supportive care   - discharge with plan to complete course of levaquin   # Serratia UTI  - suspect this is asymptomatic bacteruria - levaquin should cover well  2. AKI  - SCr is 2.09 in ED, up from baseline of ~0.6  -  improved at discharge - No hydro on CT in ED   3. Hypertension  - metoprolol - aki resolved, ok to resume arb at discharge   4. Hypothyroidism  - Continue Synthroid    5. Mild intermittent asthma  - Not in exacerbation on admission, continue as-needed albuterol     6. Elevated d-dimer - D-dimer is 2.32 in ED  - Check LE venous US (negative for DVT) and V/Q scan (negative)     Procedures: LE [place] Summary:  BILATERAL:  - No evidence of deep vein thrombosis seen in the lower extremities,  bilaterally.  -No evidence of popliteal cyst, bilaterally.   Consultations: none  Discharge Exam: Vitals:   09/24/22 2125 09/25/22 0524  BP: (!) 158/72 (!) 142/64  Pulse: (!) 101 89  Resp: 20 20  Temp: 98.3 F (36.8 C) 97.7 F (36.5 C)  SpO2: 94% 99%   Feels better, eager to go home  General: No acute distress. Cardiovascular: Heart sounds show Pocahontas Cohenour regular rate, and rhythm.  Lungs: Clear to auscultation bilaterally  Abdomen: Soft, nontender, nondistended  Neurological: Alert and oriented 3. Moves all extremities 4 with equal strength. Cranial nerves II through XII grossly intact. Extremities: No clubbing or cyanosis. No edema.   Discharge Instructions   Discharge Instructions     Call MD for:  difficulty breathing, headache or visual disturbances   Complete by: As directed    Call MD for:  extreme fatigue   Complete by: As directed    Call MD for:  hives   Complete by: As directed    Call MD for:  persistant dizziness or light-headedness   Complete by: As directed    Call MD for:  persistant nausea and vomiting   Complete by: As directed    Call MD for:  redness, tenderness, or signs of infection (pain, swelling, redness, odor or green/yellow discharge around incision site)   Complete by: As directed    Call MD for:  severe uncontrolled pain   Complete by: As directed    Call MD for:  temperature >100.4   Complete by: As directed    Diet - low sodium heart healthy    Complete by: As directed    Discharge instructions   Complete by: As directed    You were admitted with pneumonia.  You've improved on antibiotics with levaquin.  We'll send you home with antibiotics to finish your course.  You CT chest was abnormal and should be followed up with your outpatient doctor.  It may be only related to changes from pneumonia, but you'll need to follow up with your PCP to ensure these changes resolve.  Repeat labs with your PCP within 1 week or so.  Return for new, recurrent, or worsening symptoms.  Please ask your PCP to request records from this hospitalization so they know what was done and what the next steps will be.   Increase activity slowly   Complete by: As directed       Allergies as of 09/25/2022       Reactions   Influenza Vac Split Quad Swelling, Other (See Comments)   Size of grapefruit red, swollen area at injection site Leg cramps Vaccine given was Fluarix Quadrivalent 2014/2015 formula by [employer]   Penicillins Hives   Has patient had Adaline Trejos PCN reaction causing immediate rash, facial/tongue/throat swelling, SOB or lightheadedness with hypotension: yes Has patient had Marylu Dudenhoeffer PCN reaction causing severe rash involving mucus membranes or skin necrosis: no Has patient had Aureliano Oshields PCN reaction that required hospitalization: no Has patient had Josecarlos Harriott PCN reaction occurring within the last 10 years: no If all of the above answers are "NO", then may proceed with Cephalosporin use.   Sulfonamide Derivatives Hives        Medication List     STOP taking these medications    doxycycline 100 MG capsule Commonly known as: VIBRAMYCIN   predniSONE 20 MG tablet Commonly known as: DELTASONE   simvastatin 20 MG tablet Commonly known as: ZOCOR       TAKE these medications    Alaway 0.035 % ophthalmic solution Generic drug: ketotifen Place 1 drop into both eyes in the morning.   albuterol 108 (90 Base) MCG/ACT inhaler Commonly known as: VENTOLIN HFA TAKE 2  PUFFS BY MOUTH EVERY 6 HOURS AS NEEDED FOR WHEEZE OR SHORTNESS OF BREATH What changed: See the new instructions.   estradiol 0.025 mg/24hr patch Commonly known as: CLIMARA - Dosed in mg/24 hr Place 0.025 mg onto the skin every Thursday.   levocetirizine 5 MG tablet Commonly known as: XYZAL Take 1 tablet every day in the evening. What changed:  how much to take how to take this when to take this additional instructions   levofloxacin 750 MG tablet Commonly known as: Levaquin Take 1 tablet (750 mg total) by mouth every other day for 2 doses. (Take on 5/16 and 5/18)   levothyroxine 100 MCG tablet Commonly known as: SYNTHROID Take 1 tablet (100 mcg total) by mouth daily before breakfast.   losartan 100 MG tablet Commonly known as: COZAAR TAKE ONE TABLET BY MOUTH DAILY  FOR BLOOD PRESSURE What changed: See the new instructions.   metoprolol tartrate 50 MG tablet Commonly known as: LOPRESSOR Take 1 tablet (50 mg total) by mouth 2 (two) times daily.   nortriptyline 10 MG capsule Commonly known as: PAMELOR Take 2 at bedtime as needed What changed:  how much to take how to take this when to take this additional instructions   promethazine-dextromethorphan 6.25-15 MG/5ML syrup Commonly known as: PROMETHAZINE-DM TAKE FOUR TIMES DAILY AS NEEDED FOR COUGH What changed:  how much to take how to take this when to take this reasons to take this additional instructions   triamcinolone cream 0.1 % Commonly known as: KENALOG Apply topically 2 (two) times daily as needed. What changed:  how much to take reasons to take this   Vitamin D3 25 MCG (1000 UT) Chew Chew 1,500 Units by mouth daily.   zolpidem 10 MG tablet Commonly known as: AMBIEN Take 1/2 tablet at bedtime as needed for sleep. May take 1 tablet if necessary What changed:  how much to take how to take this when to take this additional instructions       Allergies  Allergen Reactions   Influenza Vac  Split Quad Swelling and Other (See Comments)    Size of grapefruit red, swollen area at injection site  Leg cramps  Vaccine given was Fluarix Quadrivalent 2014/2015 formula by [employer]   Penicillins Hives    Has patient had Anav Lammert PCN reaction causing immediate rash, facial/tongue/throat swelling, SOB or lightheadedness with hypotension: yes Has patient had Pragya Lofaso PCN reaction causing severe rash involving mucus membranes or skin necrosis: no Has patient had Creston Klas PCN reaction that required hospitalization: no Has patient had Kalyani Maeda PCN reaction occurring within the last 10 years: no If all of the above answers are "NO", then may proceed with Cephalosporin use.    Sulfonamide Derivatives Hives      The results of significant diagnostics from this hospitalization (including imaging, microbiology, ancillary and laboratory) are listed below for reference.    Significant Diagnostic Studies: VAS [place] LOWER EXTREMITY VENOUS (DVT)  Result Date: 09/24/2022  Lower Venous DVT Study Patient Name:  RONNESHA PYNES  Date of Exam:   09/24/2022 Medical Rec #: [id number]       Accession #:    [id number] Date of Birth: 04-Jan-1946       Patient Gender: F Patient Age:   77 years Exam Location:  [facility] Procedure:      VAS [place] LOWER EXTREMITY VENOUS (DVT) Referring Phys: TIMOTHY OPYD --------------------------------------------------------------------------------  Indications: Elevated D-dimer (2.32).  Comparison Study: No previous exams Performing Technologist: Jody Hill RVT, RDMS  Examination Guidelines: Rosina Cressler complete evaluation includes B-mode imaging, spectral Doppler, color Doppler, and power Doppler as needed of all accessible portions of each vessel. Bilateral testing is considered an integral part of Courtnay Petrilla complete examination. Limited examinations for reoccurring indications may be performed as noted. The reflux portion of the exam is performed with the patient in reverse Trendelenburg.   +---------+---------------+---------+-----------+----------+--------------+ RIGHT    CompressibilityPhasicitySpontaneityPropertiesThrombus Aging +---------+---------------+---------+-----------+----------+--------------+ CFV      Full           Yes      Yes                                 +---------+---------------+---------+-----------+----------+--------------+ SFJ      Full                                                        +---------+---------------+---------+-----------+----------+--------------+  FV Prox  Full           Yes      Yes                                 +---------+---------------+---------+-----------+----------+--------------+ FV Mid   Full           Yes      Yes                                 +---------+---------------+---------+-----------+----------+--------------+ FV DistalFull           Yes      Yes                                 +---------+---------------+---------+-----------+----------+--------------+ PFV      Full                                                        +---------+---------------+---------+-----------+----------+--------------+ POP      Full           Yes      Yes                                 +---------+---------------+---------+-----------+----------+--------------+ PTV      Full                                                        +---------+---------------+---------+-----------+----------+--------------+ PERO     Full                                                        +---------+---------------+---------+-----------+----------+--------------+   +---------+---------------+---------+-----------+----------+--------------+ LEFT     CompressibilityPhasicitySpontaneityPropertiesThrombus Aging +---------+---------------+---------+-----------+----------+--------------+ CFV      Full           Yes      Yes                                  +---------+---------------+---------+-----------+----------+--------------+ SFJ      Full                                                        +---------+---------------+---------+-----------+----------+--------------+ FV Prox  Full           Yes      Yes                                 +---------+---------------+---------+-----------+----------+--------------+ FV Mid   Full  Yes      Yes                                 +---------+---------------+---------+-----------+----------+--------------+ FV DistalFull           Yes      Yes                                 +---------+---------------+---------+-----------+----------+--------------+ PFV      Full                                                        +---------+---------------+---------+-----------+----------+--------------+ POP      Full           Yes      Yes                                 +---------+---------------+---------+-----------+----------+--------------+ PTV      Full                                                        +---------+---------------+---------+-----------+----------+--------------+ PERO     Full                                                        +---------+---------------+---------+-----------+----------+--------------+     Summary: BILATERAL: - No evidence of deep vein thrombosis seen in the lower extremities, bilaterally. -No evidence of popliteal cyst, bilaterally.   *See table(s) above for measurements and observations. Electronically signed by Coral Else MD on 09/24/2022 at 3:39:56 PM.    Final    NM Pulmonary Perfusion  Result Date: 09/24/2022 CLINICAL DATA:  Positive D-dimer.  Loader image probability. EXAM: NUCLEAR MEDICINE PERFUSION LUNG SCAN TECHNIQUE: Perfusion images were obtained in multiple projections after intravenous injection of radiopharmaceutical. RADIOPHARMACEUTICALS:  4.1 mCi Tc-55m MAA COMPARISON:  Radiograph 09/23/2022, CT 514  FINDINGS: No wedge-shaped peripheral perfusion defects to suggest acute pulmonary embolism. Elevation RIGHT hemidiaphragm noted. IMPRESSION: No evidence acute pulmonary disease. Electronically Signed   By: Genevive Bi M.D.   On: 09/24/2022 15:33   CT Renal Stone Study  Result Date: 09/23/2022 CLINICAL DATA:  Abdominal/flank pain, stone suspected.  Lobe EXAM: CT ABDOMEN AND PELVIS WITHOUT CONTRAST TECHNIQUE: Multidetector CT imaging of the abdomen and pelvis was performed following the standard protocol without IV contrast. RADIATION DOSE REDUCTION: This exam was performed according to the departmental dose-optimization program which includes automated exposure control, adjustment of the mA and/or kV according to patient size and/or use of iterative reconstruction technique. COMPARISON:  CT examination dated November 09, 2015 FINDINGS: Lower chest: Bilateral lower lobe bronchial thickening and ground-glass/patchy lung opacities concerning for bilateral pneumonia. Hepatobiliary: No focal liver abnormality is seen. Status post cholecystectomy. No biliary dilatation. Pancreas: Generalized pancreatic atrophy. No pancreatic ductal dilatation or surrounding inflammatory changes. Spleen:  Normal in size without focal abnormality. Adrenals/Urinary Tract: Adrenal glands are unremarkable. 3 mm nonobstructing calculus in the lower pole of the right kidney. No hydronephrosis or ureteral calculus. Nonspecific bilateral perinephric fat stranding. Bladder is unremarkable. Stomach/Bowel: Stomach is within normal limits. Appendix appears normal. Sigmoid colonic diverticulosis without evidence of acute diverticulitis. Vascular/Lymphatic: No significant vascular findings are present. No enlarged abdominal or pelvic lymph nodes. Reproductive: Status post hysterectomy. No adnexal masses. Other: No abdominal wall hernia or abnormality. No abdominopelvic ascites. 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 No acute osseous abnormality. IMPRESSION: 1. Bilateral lower lobe bronchial thickening and ground-glass/patchy lung opacities concerning for bilateral pneumonia. 2. 3 mm nonobstructing calculus in the lower pole of the right kidney. No hydronephrosis or ureteral calculus. 3. Sigmoid colonic diverticulosis without evidence of acute diverticulitis. 4. Normal appendix. No evidence of bowel obstruction. 5. 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 No acute osseous abnormality. Electronically Signed   By: Larose Hires D.O.   On: 09/23/2022 12:56   CT Chest Wo Contrast  Result Date: 09/23/2022 CLINICAL DATA:  Sinus infection since last Wednesday. Left lower quadrant pain with cough. EXAM: CT CHEST WITHOUT CONTRAST TECHNIQUE: Multidetector CT imaging of the chest was performed following the standard protocol without IV contrast. RADIATION DOSE REDUCTION: This exam was performed according to the departmental dose-optimization program which includes automated exposure control, adjustment of the mA and/or kV according to patient size and/or use of iterative reconstruction technique. COMPARISON:  Chest radiograph performed earlier on the same date. FINDINGS: Cardiovascular: No significant vascular findings. Normal heart size. No pericardial effusion. Mediastinum/Nodes: No enlarged mediastinal or axillary lymph nodes. Thyroid gland, trachea, and esophagus demonstrate no significant findings. Lungs/Pleura: There are bilateral lower lobe bronchial thickening and peribronchovascular ground-glass and small patchy lung opacities concerning for bilateral pneumonia. No pleural effusion or pneumothorax. Upper Abdomen: No acute abnormality.  Cholecystectomy changes. Musculoskeletal: No chest wall mass or suspicious bone lesions identified. IMPRESSION: Bilateral lower lobe peribronchovascular ground-glass and small patchy lung  opacities concerning for bilateral pneumonia. Follow-up examination to resolution is recommended. Electronically Signed   By: Larose Hires D.O.   On: 09/23/2022 12:49   DG Chest 2 View  Result Date: 09/23/2022 CLINICAL DATA:  77 year old female with suspected sepsis. EXAM: CHEST - 2 VIEW COMPARISON:  Chest radiographs 07/23/2022 and earlier. FINDINGS: Low lung volumes, similar to but progressed from multiple previous exams. Mediastinal contours remain normal. Visualized tracheal air column is within normal limits. No pneumothorax, pulmonary edema, pleural effusion or confluent lung opacity. No acute osseous abnormality identified. Negative visible bowel gas. Stable cholecystectomy clips. IMPRESSION: Low lung volumes, otherwise no acute cardiopulmonary abnormality. Electronically Signed   By: Odessa Fleming M.D.   On: 09/23/2022 07:29    Microbiology: Recent Results (from the past 240 hour(s))  Urine Culture     Status: Abnormal   Collection Time: 09/23/22  6:25 AM   Specimen: Urine, Clean Catch  Result Value Ref Range Status   Specimen Description   Final    URINE, CLEAN CATCH Performed at [facility], [address] [place], [place] [address]    Special Requests   Final    NONE Performed at [facility], [address] [place], [place] [address]    Culture >=100,000 COLONIES/mL SERRATIA MARCESCENS (Garth Diffley)  Final   Report Status 09/25/2022 FINAL  Final   Organism ID,  Bacteria SERRATIA MARCESCENS (Kristiane Morsch)  Final      Susceptibility   Serratia marcescens - MIC*    CEFEPIME <=0.12 SENSITIVE Sensitive     CEFTRIAXONE <=0.25 SENSITIVE Sensitive     CIPROFLOXACIN <=0.25 SENSITIVE Sensitive     GENTAMICIN <=1 SENSITIVE Sensitive     NITROFURANTOIN 128 RESISTANT Resistant     TRIMETH/SULFA <=20 SENSITIVE Sensitive     * >=100,000 COLONIES/mL SERRATIA MARCESCENS  Culture, blood (Routine x 2)     Status: None (Preliminary result)   Collection Time: 09/23/22  6:50 AM   Specimen: BLOOD   Result Value Ref Range Status   Specimen Description   Final    BLOOD RIGHT ANTECUBITAL Performed at [facility], [address] [place], [place] [address]    Special Requests   Final    Blood Culture adequate volume BOTTLES DRAWN AEROBIC AND ANAEROBIC Performed at [facility], [address] [place], [place] [address]    Culture   Final    NO GROWTH 2 DAYS Performed at [facility], [address], [place], [place] [address]    Report Status PENDING  Incomplete  Culture, blood (Routine x 2)     Status: None (Preliminary result)   Collection Time: 09/23/22  6:55 AM   Specimen: BLOOD  Result Value Ref Range Status   Specimen Description   Final    BLOOD LEFT ANTECUBITAL Performed at [facility], [address] [place], [place] [address]    Special Requests   Final    Blood Culture adequate volume BOTTLES DRAWN AEROBIC AND ANAEROBIC Performed at [facility], [address] [place], [place] [address]    Culture   Final    NO GROWTH 2 DAYS Performed at [facility], [address] [place], [place] [address]    Report Status PENDING  Incomplete  Resp panel by RT-PCR (RSV, Flu Jamel Holzmann&B, Covid) Anterior Nasal Swab     Status: None   Collection Time: 09/23/22  2:17 PM   Specimen: Anterior Nasal Swab  Result Value Ref Range Status   SARS Coronavirus 2 by RT PCR NEGATIVE NEGATIVE Final    Comment: (NOTE) SARS-CoV-2 target nucleic acids are NOT DETECTED.  The SARS-CoV-2 RNA is generally detectable in upper respiratory specimens during the acute phase of infection. The lowest concentration of SARS-CoV-2 viral copies this assay can detect is 138 copies/mL. Ardean Melroy negative result does not preclude SARS-Cov-2 infection and should not be used as the sole basis for treatment or other patient management decisions. Emmalena Canny negative result may occur with  improper specimen collection/handling, submission of specimen other than  nasopharyngeal swab, presence of viral mutation(s) within the areas targeted by this assay, and inadequate number of viral copies(<138 copies/mL). Mohit Zirbes negative result must be combined with clinical observations, patient history, and epidemiological information. The expected result is Negative.  Fact Sheet for Patients:  BloggerCourse.com  Fact Sheet for Healthcare Providers:  SeriousBroker.it  This test is no t yet approved or cleared by the [place] FDA and  has been authorized for detection and/or diagnosis of SARS-CoV-2 by FDA under an Emergency Use Authorization (EUA). This EUA will remain  in effect (meaning this test can be used) for the duration of the COVID-19 declaration under Section 564(b)(1) of the Act, 21 U.S.C.section 360bbb-3(b)(1), unless the authorization is terminated  or revoked sooner.       Influenza Frutoso Dimare by PCR NEGATIVE  NEGATIVE Final   Influenza B by PCR NEGATIVE NEGATIVE Final    Comment: (NOTE) The Xpert Xpress SARS-CoV-2/FLU/RSV plus assay is intended as an aid in the diagnosis of influenza from Nasopharyngeal swab specimens and should not be used as Karsten Howry sole basis for treatment. Nasal washings and aspirates are unacceptable for Xpert Xpress SARS-CoV-2/FLU/RSV testing.  Fact Sheet for Patients: BloggerCourse.com  Fact Sheet for Healthcare Providers: SeriousBroker.it  This test is not yet approved or cleared by the [place] FDA and has been authorized for detection and/or diagnosis of SARS-CoV-2 by FDA under an Emergency Use Authorization (EUA). This EUA will remain in effect (meaning this test can be used) for the duration of the COVID-19 declaration under Section 564(b)(1) of the Act, 21 U.S.C. section 360bbb-3(b)(1), unless the authorization is terminated or revoked.     Resp Syncytial Virus by PCR NEGATIVE NEGATIVE Final    Comment:  (NOTE) Fact Sheet for Patients: BloggerCourse.com  Fact Sheet for Healthcare Providers: SeriousBroker.it  This test is not yet approved or cleared by the [place] FDA and has been authorized for detection and/or diagnosis of SARS-CoV-2 by FDA under an Emergency Use Authorization (EUA). This EUA will remain in effect (meaning this test can be used) for the duration of the COVID-19 declaration under Section 564(b)(1) of the Act, 21 U.S.C. section 360bbb-3(b)(1), unless the authorization is terminated or revoked.  Performed at [facility], [address] [place], [place] [address]      Labs: Basic Metabolic Panel: Recent Labs  Lab 09/23/22 1478 09/23/22 1036 09/24/22 0417 09/25/22 0437  NA 136 136 138 138  K 3.5 3.8 3.8 4.0  CL 101 107 108 108  CO2 23 20* 22 22  GLUCOSE 139* 100* 107* 106*  BUN 29* 26* 23 23  CREATININE 2.09* 1.84* 1.45* 1.00  CALCIUM 8.6* 7.7* 8.5* 8.6*  MG  --   --  1.8  --    Liver Function Tests: Recent Labs  Lab 09/23/22 0651 09/24/22 0417  AST 43* 26  ALT 45* 33  ALKPHOS 126 88  BILITOT 0.4 0.3  PROT 7.2 6.0*  ALBUMIN 3.0* 2.4*   No results for input(s): "LIPASE", "AMYLASE" in the last 168 hours. No results for input(s): "AMMONIA" in the last 168 hours. CBC: Recent Labs  Lab 09/23/22 0651 09/24/22 0417 09/25/22 0437  WBC 14.2* 10.4 11.8*  NEUTROABS 10.2*  --   --   HGB 11.5* 9.7* 9.9*  HCT 34.6* 29.5* 30.7*  MCV 89.2 92.8 93.9  PLT 417* 340 382   Cardiac Enzymes: No results for input(s): "CKTOTAL", "CKMB", "CKMBINDEX", "TROPONINI" in the last 168 hours. BNP: BNP (last 3 results) No results for input(s): "BNP" in the last 8760 hours.  ProBNP (last 3 results) No results for input(s): "PROBNP" in the last 8760 hours.  CBG: No results for input(s): "GLUCAP" in the last 168 hours.     Signed:  Lacretia Nicks MD.  [facility] 09/25/2022, 11:35  AM

## 2022-09-26 ENCOUNTER — Telehealth: Payer: Self-pay

## 2022-09-26 LAB — LEGIONELLA PNEUMOPHILA SEROGP 1 UR AG: L. pneumophila Serogp 1 Ur Ag: NEGATIVE

## 2022-09-26 NOTE — Transitions of Care (Post Inpatient/ED Visit) (Signed)
09/26/2022  Name: Alicia Wallace MRN: [id number] DOB: 1945/11/26  Today's TOC FU Call Status: Today's TOC FU Call Status:: Successful TOC FU Call Competed TOC FU Call Complete Date: 09/26/22  Transition Care Management Follow-up Telephone Call Date of Discharge: 09/25/22 Discharge Facility: [facility]) Type of Discharge: Inpatient Admission Primary Inpatient Discharge Diagnosis:: Pneumonia How have you been since you were released from the hospital?: Better Any questions or concerns?: No  Items Reviewed: Did you receive and understand the discharge instructions provided?: Yes Medications obtained,verified, and reconciled?: Yes (Medications Reviewed) Any new allergies since your discharge?: No Dietary orders reviewed?: Yes Type of Diet Ordered:: Heart Healthy Do you have support at home?: Yes People in Home: friend(s) Name of Support/Comfort Primary Source: Onalee Hua  Medications Reviewed Today: Medications Reviewed Today     Reviewed by Jodelle Gross, RN (Case Manager) on 09/26/22 at 1303  Med List Status: <None>   Medication Order Taking? Sig Documenting Provider Last Dose Status Informant  ALAWAY 0.035 % ophthalmic solution [id number] Yes Place 1 drop into both eyes in the morning. [provider] Taking Active Self  albuterol (VENTOLIN HFA) 108 (90 Base) MCG/ACT inhaler [id number] Yes TAKE 2 PUFFS BY MOUTH EVERY 6 HOURS AS NEEDED FOR WHEEZE OR SHORTNESS OF BREATH  Patient taking differently: Inhale 2 puffs into the lungs every 6 (six) hours as needed for wheezing or shortness of breath.   Copland, Gwenlyn Found, MD Taking Active Self  Cholecalciferol (VITAMIN D3) 25 MCG (1000 UT) CHEW [id number] Yes Chew 1,500 Units by mouth daily. [provider] Taking Active Self  estradiol (CLIMARA - DOSED IN MG/24 HR) 0.025 mg/24hr patch [id number] Yes Place 0.025 mg onto the skin every Thursday. [provider] Taking Active Self  levocetirizine (XYZAL) 5 MG  tablet [id number] Yes Take 1 tablet every day in the evening.  Patient taking differently: Take 5 mg by mouth daily.   Copland, Gwenlyn Found, MD Taking Active Self  levofloxacin (LEVAQUIN) 750 MG tablet [id number] Yes Take 1 tablet (750 mg total) by mouth every other day for 2 doses. (Take on 5/16 and 5/18) Zigmund Daniel., MD Taking Active   levothyroxine (SYNTHROID) 100 MCG tablet [id number] Yes Take 1 tablet (100 mcg total) by mouth daily before breakfast. Copland, Gwenlyn Found, MD Taking Active Self  losartan (COZAAR) 100 MG tablet [id number] Yes TAKE ONE TABLET BY MOUTH DAILY FOR BLOOD PRESSURE  Patient taking differently: Take 100 mg by mouth daily.   Copland, Gwenlyn Found, MD Taking Active Self  metoprolol tartrate (LOPRESSOR) 50 MG tablet [id number] Yes Take 1 tablet (50 mg total) by mouth 2 (two) times daily. Copland, Gwenlyn Found, MD Taking Active Self  nortriptyline (PAMELOR) 10 MG capsule [id number] Yes Take 2 at bedtime as needed  Patient taking differently: Take 20 mg by mouth at bedtime.   Copland, Gwenlyn Found, MD Taking Active Self  promethazine-dextromethorphan (PROMETHAZINE-DM) 6.25-15 MG/5ML syrup [id number] Yes TAKE FOUR TIMES DAILY AS NEEDED FOR COUGH  Patient taking differently: Take 5 mLs by mouth 4 (four) times daily as needed for cough.   Copland, Gwenlyn Found, MD Taking Active Self  triamcinolone cream (KENALOG) 0.1 % [id number] No Apply topically 2 (two) times daily as needed.  Patient taking differently: Apply 1 Application topically 2 (two) times daily as needed (to affected areas- for itching or irritation).   Copland, Gwenlyn Found, MD Unknown Active Self  zolpidem (AMBIEN) 10 MG tablet [id number] Yes Take 1/2 tablet at bedtime as needed for  sleep. May take 1 tablet if necessary  Patient taking differently: Take 10 mg by mouth at bedtime.   Copland, Gwenlyn Found, MD Taking Active Self  Med List Note Maple Hudson, Rennis Chris, MD 01/18/15 [phone]): Allergy vaccine 1:10 GH dc'd             Home Care and Equipment/Supplies: Were Home Health Services Ordered?: No Any new equipment or medical supplies ordered?: No  Functional Questionnaire: Do you need assistance with bathing/showering or dressing?: No Do you need assistance with meal preparation?: No Do you need assistance with eating?: No Do you have difficulty maintaining continence: No Do you need assistance with getting out of bed/getting out of a chair/moving?: No Do you have difficulty managing or taking your medications?: No  Follow up appointments reviewed: PCP Follow-up appointment confirmed?: No (Patient prefers to set up her own appointment) MD Provider Line [phone] Given: No Specialist Hospital Follow-up appointment confirmed?: NA Do you need transportation to your follow-up appointment?: No Do you understand care options if your condition(s) worsen?: Yes-patient verbalized understanding  SDOH Interventions Today    Flowsheet Row Most Recent Value  SDOH Interventions   Food Insecurity Interventions Intervention Not Indicated  Housing Interventions Intervention Not Indicated  Transportation Interventions Intervention Not Indicated  Utilities Interventions Intervention Not Indicated  Financial Strain Interventions Intervention Not Indicated      Jodelle Gross, RN, BSN, CCM Care Management Coordinator [place] Phone: [phone]/Fax: [phone]

## 2022-09-28 LAB — CULTURE, BLOOD (ROUTINE X 2)
Culture: NO GROWTH
Culture: NO GROWTH
Special Requests: ADEQUATE
Special Requests: ADEQUATE

## 2022-09-29 NOTE — Progress Notes (Unsigned)
[facility] [address] [place], [place] [address] [phone]  Date:  10/01/2022   Name:  Alicia Wallace   DOB:  09-09-1945   MRN:  [id number]  PCP:  Pearline Cables, MD    Chief Complaint: No chief complaint on file.   History of Present Illness:  Alicia Wallace is a 77 y.o. very pleasant female patient who presents with the following:  Pt seen today for a hospital recheck visit.  History of hypertension, hypothyroidism, hyperlipidemia, prediabetes    Most recently seen by myself about 6 weeks ago at which time I treated her for a cough with albuterol and prednisone  - however unfortunately she worsened and was admitted with pneumonia  Admit date: 09/23/2022 Discharge date: 09/25/2022 Recommendations for Outpatient Follow-up:  Follow outpatient CBC/CMP Follow repeat chest imaging outpatient (follow up to resolution recommended of abnormal CT findings)   Assessment and Plan: 1. Pneumonia  - CT with bilateral lower lobe bronchial thickening and ground glass patchy lung opacities concerning for bilateral pneumonia -> needs follow up to resolution  - Continue Levaquin, follow cultures, negative urine strep, pending urine legionella, trend procalcitonin (less than 0.10), continue supportive care   - discharge with plan to complete course of levaquin # Serratia UTI  - suspect this is asymptomatic bacteruria - levaquin should cover well 2. AKI  - SCr is 2.09 in ED, up from baseline of ~0.6  - improved at discharge - No hydro on CT in ED 3. Hypertension  - metoprolol - aki resolved, ok to resume arb at discharge 4. Hypothyroidism  - Continue Synthroid  5. Mild intermittent asthma  - Not in exacerbation on admission, continue as-needed albuterol   6. Elevated d-dimer - D-dimer is 2.32 in ED  - Check LE venous US (negative for DVT) and V/Q scan (negative)  CXR 5/14 CLINICAL DATA:  77 year old female with  suspected sepsis. EXAM: CHEST - 2 VIEW COMPARISON:  Chest radiographs 07/23/2022 and earlier.   FINDINGS: Low lung volumes, similar to but progressed from multiple previous exams. Mediastinal contours remain normal. Visualized tracheal air column is within normal limits. No pneumothorax, pulmonary edema, pleural effusion or confluent lung opacity. No acute osseous abnormality identified. Negative visible bowel gas. Stable cholecystectomy clips. IMPRESSION: Low lung volumes, otherwise no acute cardiopulmonary abnormality. Patient Active Problem List   Diagnosis Date Noted   Community acquired pneumonia 09/24/2022   Pneumonia 09/23/2022   AKI (acute kidney injury) (HCC) 09/23/2022   Prediabetes 06/03/2021   Other and unspecified hyperlipidemia 08/22/2013   HTN (hypertension) 04/06/2012   Osteopenia 04/06/2012   Seasonal and perennial allergic rhinitis 07/15/2010   Hypothyroidism 05/30/2008   Asthma, mild intermittent 07/14/2007    Past Medical History:  Diagnosis Date   Allergic rhinitis    Asthma    Hypertension    Hypothyroid     Past Surgical History:  Procedure Laterality Date   APPENDECTOMY     CHOLECYSTECTOMY     TONSILLECTOMY     TOTAL ABDOMINAL HYSTERECTOMY W/ BILATERAL SALPINGOOPHORECTOMY     WISDOM TOOTH EXTRACTION      Social History   Tobacco Use   Smoking status: Never   Smokeless tobacco: Never  Vaping Use   Vaping Use: Never used  Substance Use Topics   Alcohol use: No    Alcohol/week: 0.0 standard drinks of alcohol   Drug use: No    Family History  Problem Relation  Age of Onset   Kidney disease Mother    Heart disease Father    Cancer Father        bladder   Asthma Father    Allergies Father     Allergies  Allergen Reactions   Influenza Vac Split Quad Swelling and Other (See Comments)    Size of grapefruit red, swollen area at injection site  Leg cramps  Vaccine given was Fluarix Quadrivalent 2014/2015 formula by [employer]    Penicillins Hives    Has patient had a PCN reaction causing immediate rash, facial/tongue/throat swelling, SOB or lightheadedness with hypotension: yes Has patient had a PCN reaction causing severe rash involving mucus membranes or skin necrosis: no Has patient had a PCN reaction that required hospitalization: no Has patient had a PCN reaction occurring within the last 10 years: no If all of the above answers are "NO", then may proceed with Cephalosporin use.    Sulfonamide Derivatives Hives    Medication list has been reviewed and updated.  Current Outpatient Medications on File Prior to Visit  Medication Sig Dispense Refill   ALAWAY 0.035 % ophthalmic solution Place 1 drop into both eyes in the morning.     albuterol (VENTOLIN HFA) 108 (90 Base) MCG/ACT inhaler TAKE 2 PUFFS BY MOUTH EVERY 6 HOURS AS NEEDED FOR WHEEZE OR SHORTNESS OF BREATH (Patient taking differently: Inhale 2 puffs into the lungs every 6 (six) hours as needed for wheezing or shortness of breath.) 8.5 each 3   Cholecalciferol (VITAMIN D3) 25 MCG (1000 UT) CHEW Chew 1,500 Units by mouth daily.     estradiol (CLIMARA - DOSED IN MG/24 HR) 0.025 mg/24hr patch Place 0.025 mg onto the skin every Thursday.  2   levocetirizine (XYZAL) 5 MG tablet Take 1 tablet every day in the evening. (Patient taking differently: Take 5 mg by mouth daily.) 90 tablet 3   levothyroxine (SYNTHROID) 100 MCG tablet Take 1 tablet (100 mcg total) by mouth daily before breakfast. 90 tablet 1   losartan (COZAAR) 100 MG tablet TAKE ONE TABLET BY MOUTH DAILY FOR BLOOD PRESSURE (Patient taking differently: Take 100 mg by mouth daily.) 90 tablet 3   metoprolol tartrate (LOPRESSOR) 50 MG tablet Take 1 tablet (50 mg total) by mouth 2 (two) times daily. 180 tablet 3   nortriptyline (PAMELOR) 10 MG capsule Take 2 at bedtime as needed (Patient taking differently: Take 20 mg by mouth at bedtime.) 180 capsule 3   promethazine-dextromethorphan (PROMETHAZINE-DM) 6.25-15  MG/5ML syrup TAKE FOUR TIMES DAILY AS NEEDED FOR COUGH (Patient taking differently: Take 5 mLs by mouth 4 (four) times daily as needed for cough.) 118 mL 0   triamcinolone cream (KENALOG) 0.1 % Apply topically 2 (two) times daily as needed. (Patient taking differently: Apply 1 Application topically 2 (two) times daily as needed (to affected areas- for itching or irritation).) 30 g 1   zolpidem (AMBIEN) 10 MG tablet Take 1/2 tablet at bedtime as needed for sleep. May take 1 tablet if necessary (Patient taking differently: Take 10 mg by mouth at bedtime.) 30 tablet 5   No current facility-administered medications on file prior to visit.    Review of Systems:  As per HPI- otherwise negative.   Physical Examination: There were no vitals filed for this visit. There were no vitals filed for this visit. There is no height or weight on file to calculate BMI. Ideal Body Weight:    GEN: no acute distress. HEENT: Atraumatic, Normocephalic.  Ears and  Nose: No external deformity. CV: RRR, No M/G/R. No JVD. No thrill. No extra heart sounds. PULM: CTA B, no wheezes, crackles, rhonchi. No retractions. No resp. distress. No accessory muscle use. ABD: S, NT, ND, +BS. No rebound. No HSM. EXTR: No c/c/e PSYCH: Normally interactive. Conversant.    Assessment and Plan: ***  Signed Abbe Amsterdam, MD

## 2022-10-01 ENCOUNTER — Ambulatory Visit (INDEPENDENT_AMBULATORY_CARE_PROVIDER_SITE_OTHER): Payer: Medicare HMO | Admitting: Family Medicine

## 2022-10-01 VITALS — BP 124/78 | HR 51 | Temp 97.9°F | Resp 18 | Ht 60.0 in | Wt 143.4 lb

## 2022-10-01 DIAGNOSIS — J189 Pneumonia, unspecified organism: Secondary | ICD-10-CM

## 2022-10-01 DIAGNOSIS — D649 Anemia, unspecified: Secondary | ICD-10-CM

## 2022-10-01 DIAGNOSIS — N289 Disorder of kidney and ureter, unspecified: Secondary | ICD-10-CM | POA: Diagnosis not present

## 2022-10-01 LAB — POCT HEMOGLOBIN: Hemoglobin: 10 g/dL — AB (ref 11–14.6)

## 2022-10-01 NOTE — Patient Instructions (Signed)
It was good to see you today- assuming you continue to feel better please schedule your repeat chest CT and labs for about one month from now If any other concerns in the meantime please alert me!

## 2022-10-21 ENCOUNTER — Telehealth: Payer: Self-pay | Admitting: Family Medicine

## 2022-10-21 NOTE — Telephone Encounter (Signed)
Pt called stating that her insurance company had told her the authorization for her CT scan is only good till 6.21.24. Pt is currently scheduled for 6.24.24 and imaging could not get her in any sooner. Pt would like for [place] to resubmit the authorization to encompass the current appt she has.

## 2022-10-22 ENCOUNTER — Other Ambulatory Visit: Payer: Self-pay | Admitting: Family Medicine

## 2022-10-22 DIAGNOSIS — L309 Dermatitis, unspecified: Secondary | ICD-10-CM

## 2022-11-01 ENCOUNTER — Ambulatory Visit (HOSPITAL_BASED_OUTPATIENT_CLINIC_OR_DEPARTMENT_OTHER)
Admission: RE | Admit: 2022-11-01 | Discharge: 2022-11-01 | Disposition: A | Discharge status: Home / Self Care | Payer: Medicare HMO | Source: Ambulatory Visit | Attending: Family Medicine | Admitting: Family Medicine

## 2022-11-01 DIAGNOSIS — J189 Pneumonia, unspecified organism: Secondary | ICD-10-CM | POA: Diagnosis not present

## 2022-11-03 ENCOUNTER — Encounter: Payer: Self-pay | Admitting: Family Medicine

## 2022-11-03 ENCOUNTER — Ambulatory Visit (HOSPITAL_BASED_OUTPATIENT_CLINIC_OR_DEPARTMENT_OTHER): Payer: Medicare HMO

## 2022-11-03 ENCOUNTER — Other Ambulatory Visit (INDEPENDENT_AMBULATORY_CARE_PROVIDER_SITE_OTHER): Payer: Medicare HMO

## 2022-11-03 DIAGNOSIS — N289 Disorder of kidney and ureter, unspecified: Secondary | ICD-10-CM

## 2022-11-03 DIAGNOSIS — D649 Anemia, unspecified: Secondary | ICD-10-CM | POA: Diagnosis not present

## 2022-11-03 LAB — COMPREHENSIVE METABOLIC PANEL
ALT: 11 U/L (ref 0–35)
AST: 14 U/L (ref 0–37)
Albumin: 3.8 g/dL (ref 3.5–5.2)
Alkaline Phosphatase: 57 U/L (ref 39–117)
BUN: 12 mg/dL (ref 6–23)
CO2: 26 mEq/L (ref 19–32)
Calcium: 9.2 mg/dL (ref 8.4–10.5)
Chloride: 105 mEq/L (ref 96–112)
Creatinine, Ser: 0.64 mg/dL (ref 0.40–1.20)
GFR: 86.55 mL/min (ref >60.00)
Glucose, Bld: 89 mg/dL (ref 70–99)
Potassium: 4.2 mEq/L (ref 3.5–5.1)
Sodium: 138 mEq/L (ref 135–145)
Total Bilirubin: 0.5 mg/dL (ref 0.2–1.2)
Total Protein: 6.4 g/dL (ref 6.0–8.3)

## 2022-11-03 LAB — CBC
HCT: 35 % — ABNORMAL LOW (ref 36.0–46.0)
Hemoglobin: 11.4 g/dL — ABNORMAL LOW (ref 12.0–15.0)
MCHC: 32.4 g/dL (ref 30.0–36.0)
MCV: 92.2 fl (ref 78.0–100.0)
Platelets: 329 10*3/uL (ref 150.0–400.0)
RBC: 3.79 Mil/uL — ABNORMAL LOW (ref 3.87–5.11)
RDW: 14.9 % (ref 11.5–15.5)
WBC: 8.4 10*3/uL (ref 4.0–10.5)

## 2022-11-05 ENCOUNTER — Telehealth: Payer: Self-pay | Admitting: Family Medicine

## 2022-11-05 ENCOUNTER — Encounter: Payer: Self-pay | Admitting: Family Medicine

## 2022-11-05 NOTE — Telephone Encounter (Signed)
Pt called to see if CT results were available yet. CT was done on 11/01/22 which is over the 48-72 hour turnaround time. Please follow up and call patient to advise.

## 2022-11-05 NOTE — Telephone Encounter (Signed)
CT still not available

## 2022-11-06 ENCOUNTER — Encounter: Payer: Self-pay | Admitting: Family Medicine

## 2022-12-19 ENCOUNTER — Ambulatory Visit: Payer: Medicare HMO | Admitting: *Deleted

## 2022-12-19 VITALS — BP 120/80 | Ht 60.0 in | Wt 130.0 lb

## 2022-12-19 DIAGNOSIS — J301 Allergic rhinitis due to pollen: Secondary | ICD-10-CM | POA: Diagnosis not present

## 2022-12-19 DIAGNOSIS — Z0000 Encounter for general adult medical examination without abnormal findings: Secondary | ICD-10-CM | POA: Diagnosis not present

## 2022-12-19 MED ORDER — LEVOCETIRIZINE DIHYDROCHLORIDE 5 MG PO TABS
ORAL_TABLET | ORAL | 3 refills | Status: AC
Start: 2022-12-19 — End: ?

## 2022-12-19 NOTE — Patient Instructions (Signed)
Alicia Wallace , Thank you for taking time to come for your Medicare Wellness Visit. I appreciate your ongoing commitment to your health goals. Please review the following plan we discussed and let me know if I can assist you in the future.    This is a list of the screening recommended for you and due dates:  Health Maintenance  Topic Date Due   Mammogram  04/13/2023   Medicare Annual Wellness Visit  12/19/2023   Cologuard (Stool DNA test)  10/28/2024   DTaP/Tdap/Td vaccine (3 - Td or Tdap) 07/30/2028   Pneumonia Vaccine  Completed   DEXA scan (bone density measurement)  Completed   Hepatitis C Screening  Completed   Zoster (Shingles) Vaccine  Completed   HPV Vaccine  Aged Out   COVID-19 Vaccine  Discontinued    Next appointment: Follow up in one year for your annual wellness visit.   Preventive Care 40 Years and Older, Female Preventive care refers to lifestyle choices and visits with your health care provider that can promote health and wellness. What does preventive care include? A yearly physical exam. This is also called an annual well check. Dental exams once or twice a year. Routine eye exams. Ask your health care provider how often you should have your eyes checked. Personal lifestyle choices, including: Daily care of your teeth and gums. Regular physical activity. Eating a healthy diet. Avoiding tobacco and drug use. Limiting alcohol use. Practicing safe sex. Taking low-dose aspirin every day. Taking vitamin and mineral supplements as recommended by your health care provider. What happens during an annual well check? The services and screenings done by your health care provider during your annual well check will depend on your age, overall health, lifestyle risk factors, and family history of disease. Counseling  Your health care provider may ask you questions about your: Alcohol use. Tobacco use. Drug use. Emotional well-being. Home and relationship  well-being. Sexual activity. Eating habits. History of falls. Memory and ability to understand (cognition). Work and work Astronomer. Reproductive health. Screening  You may have the following tests or measurements: Height, weight, and BMI. Blood pressure. Lipid and cholesterol levels. These may be checked every 5 years, or more frequently if you are over 5 years old. Skin check. Lung cancer screening. You may have this screening every year starting at age 65 if you have a 30-pack-year history of smoking and currently smoke or have quit within the past 15 years. Fecal occult blood test (FOBT) of the stool. You may have this test every year starting at age 52. Flexible sigmoidoscopy or colonoscopy. You may have a sigmoidoscopy every 5 years or a colonoscopy every 10 years starting at age 13. Hepatitis C blood test. Hepatitis B blood test. Sexually transmitted disease (STD) testing. Diabetes screening. This is done by checking your blood sugar (glucose) after you have not eaten for a while (fasting). You may have this done every 1-3 years. Bone density scan. This is done to screen for osteoporosis. You may have this done starting at age 1. Mammogram. This may be done every 1-2 years. Talk to your health care provider about how often you should have regular mammograms. Talk with your health care provider about your test results, treatment options, and if necessary, the need for more tests. Vaccines  Your health care provider may recommend certain vaccines, such as: Influenza vaccine. This is recommended every year. Tetanus, diphtheria, and acellular pertussis (Tdap, Td) vaccine. You may need a Td booster every 10 years.  Zoster vaccine. You may need this after age 55. Pneumococcal 13-valent conjugate (PCV13) vaccine. One dose is recommended after age 85. Pneumococcal polysaccharide (PPSV23) vaccine. One dose is recommended after age 3. Talk to your health care provider about which  screenings and vaccines you need and how often you need them. This information is not intended to replace advice given to you by your health care provider. Make sure you discuss any questions you have with your health care provider. Document Released: 05/25/2015 Document Revised: 01/16/2016 Document Reviewed: 02/27/2015 Elsevier Interactive Patient Education  2017 [employer].  Fall Prevention in the Home Falls can cause injuries. They can happen to people of all ages. There are many things you can do to make your home safe and to help prevent falls. What can I do on the outside of my home? Regularly fix the edges of walkways and driveways and fix any cracks. Remove anything that might make you trip as you walk through a door, such as a raised step or threshold. Trim any bushes or trees on the path to your home. Use bright outdoor lighting. Clear any walking paths of anything that might make someone trip, such as rocks or tools. Regularly check to see if handrails are loose or broken. Make sure that both sides of any steps have handrails. Any raised decks and porches should have guardrails on the edges. Have any leaves, snow, or ice cleared regularly. Use sand or salt on walking paths during winter. Clean up any spills in your garage right away. This includes oil or grease spills. What can I do in the bathroom? Use night lights. Install grab bars by the toilet and in the tub and shower. Do not use towel bars as grab bars. Use non-skid mats or decals in the tub or shower. If you need to sit down in the shower, use a plastic, non-slip stool. Keep the floor dry. Clean up any water that spills on the floor as soon as it happens. Remove soap buildup in the tub or shower regularly. Attach bath mats securely with double-sided non-slip rug tape. Do not have throw rugs and other things on the floor that can make you trip. What can I do in the bedroom? Use night lights. Make sure that you have a  light by your bed that is easy to reach. Do not use any sheets or blankets that are too big for your bed. They should not hang down onto the floor. Have a firm chair that has side arms. You can use this for support while you get dressed. Do not have throw rugs and other things on the floor that can make you trip. What can I do in the kitchen? Clean up any spills right away. Avoid walking on wet floors. Keep items that you use a lot in easy-to-reach places. If you need to reach something above you, use a strong step stool that has a grab bar. Keep electrical cords out of the way. Do not use floor polish or wax that makes floors slippery. If you must use wax, use non-skid floor wax. Do not have throw rugs and other things on the floor that can make you trip. What can I do with my stairs? Do not leave any items on the stairs. Make sure that there are handrails on both sides of the stairs and use them. Fix handrails that are broken or loose. Make sure that handrails are as long as the stairways. Check any carpeting to make sure that  it is firmly attached to the stairs. Fix any carpet that is loose or worn. Avoid having throw rugs at the top or bottom of the stairs. If you do have throw rugs, attach them to the floor with carpet tape. Make sure that you have a light switch at the top of the stairs and the bottom of the stairs. If you do not have them, ask someone to add them for you. What else can I do to help prevent falls? Wear shoes that: Do not have high heels. Have rubber bottoms. Are comfortable and fit you well. Are closed at the toe. Do not wear sandals. If you use a stepladder: Make sure that it is fully opened. Do not climb a closed stepladder. Make sure that both sides of the stepladder are locked into place. Ask someone to hold it for you, if possible. Clearly mark and make sure that you can see: Any grab bars or handrails. First and last steps. Where the edge of each step  is. Use tools that help you move around (mobility aids) if they are needed. These include: Canes. Walkers. Scooters. Crutches. Turn on the lights when you go into a dark area. Replace any light bulbs as soon as they burn out. Set up your furniture so you have a clear path. Avoid moving your furniture around. If any of your floors are uneven, fix them. If there are any pets around you, be aware of where they are. Review your medicines with your doctor. Some medicines can make you feel dizzy. This can increase your chance of falling. Ask your doctor what other things that you can do to help prevent falls. This information is not intended to replace advice given to you by your health care provider. Make sure you discuss any questions you have with your health care provider. Document Released: 02/22/2009 Document Revised: 10/04/2015 Document Reviewed: 06/02/2014 Elsevier Interactive Patient Education  2017 [employer].

## 2022-12-19 NOTE — Progress Notes (Signed)
Subjective:   Alicia Wallace is a 77 y.o. female who presents for Medicare Annual (Subsequent) preventive examination.  Visit Complete: Virtual  I connected with  Alicia Wallace on 12/19/22 by a audio enabled telemedicine application and verified that I am speaking with the correct person using two identifiers.  Patient Location: Home  Provider Location: Office/Clinic  I discussed the limitations of evaluation and management by telemedicine. The patient expressed understanding and agreed to proceed.   Review of Systems     Cardiac Risk Factors include: advanced age (>2men, >20 women);dyslipidemia;hypertension     Objective:   Pt reported vitals. Today's Vitals   12/19/22 1038  BP: 120/80  Weight: 130 lb (59 kg)  Height: 5' (1.524 m)   Body mass index is 25.39 kg/m.     12/19/2022   10:29 AM 09/23/2022    7:10 PM 09/23/2022    6:38 AM 12/17/2021    8:26 AM 12/10/2020    9:42 AM 11/16/2019    8:05 AM 11/09/2015   11:10 AM  Advanced Directives  Does Patient Have a Medical Advance Directive? Yes  No No No Yes No  Type of Estate agent of [place];Living will     Healthcare Power of [place];Living will   Does patient want to make changes to medical advance directive? No - Patient declined     No - Patient declined   Copy of Healthcare Power of Attorney in Chart? No - copy requested     No - copy requested   Would patient like information on creating a medical advance directive?  No - Patient declined  No - Patient declined No - Patient declined      Current Medications (verified) Outpatient Encounter Medications as of 12/19/2022  Medication Sig   ALAWAY 0.035 % ophthalmic solution Place 1 drop into both eyes in the morning.   albuterol (VENTOLIN HFA) 108 (90 Base) MCG/ACT inhaler TAKE 2 PUFFS BY MOUTH EVERY 6 HOURS AS NEEDED FOR WHEEZE OR SHORTNESS OF BREATH (Patient taking differently: Inhale 2 puffs into the lungs every 6 (six) hours as needed for wheezing  or shortness of breath.)   Cholecalciferol (VITAMIN D3) 25 MCG (1000 UT) CHEW Chew 1,500 Units by mouth daily.   estradiol (CLIMARA - DOSED IN MG/24 HR) 0.025 mg/24hr patch Place 0.025 mg onto the skin every Thursday.   levocetirizine (XYZAL) 5 MG tablet Take 1 tablet every day in the evening.   levothyroxine (SYNTHROID) 100 MCG tablet Take 1 tablet (100 mcg total) by mouth daily before breakfast.   losartan (COZAAR) 100 MG tablet TAKE ONE TABLET BY MOUTH DAILY FOR BLOOD PRESSURE (Patient taking differently: Take 100 mg by mouth daily.)   metoprolol tartrate (LOPRESSOR) 50 MG tablet Take 1 tablet (50 mg total) by mouth 2 (two) times daily.   nortriptyline (PAMELOR) 10 MG capsule Take 2 at bedtime as needed (Patient taking differently: Take 20 mg by mouth at bedtime.)   promethazine-dextromethorphan (PROMETHAZINE-DM) 6.25-15 MG/5ML syrup TAKE FOUR TIMES DAILY AS NEEDED FOR COUGH (Patient taking differently: Take 5 mLs by mouth 4 (four) times daily as needed for cough.)   triamcinolone cream (KENALOG) 0.1 % APPLY TO THE AFFECTED AREA(S) TWICE DAILY AS NEEDED   zolpidem (AMBIEN) 10 MG tablet Take 1/2 tablet at bedtime as needed for sleep. May take 1 tablet if necessary (Patient taking differently: Take 10 mg by mouth at bedtime.)   [DISCONTINUED] levocetirizine (XYZAL) 5 MG tablet Take 1 tablet every day  in the evening. (Patient taking differently: Take 5 mg by mouth daily.)   No facility-administered encounter medications on file as of 12/19/2022.    Allergies (verified) Influenza vac split quad, Penicillins, and Sulfonamide derivatives   History: Past Medical History:  Diagnosis Date   Allergic rhinitis    Asthma    Hypertension    Hypothyroid    Past Surgical History:  Procedure Laterality Date   APPENDECTOMY     CHOLECYSTECTOMY     TONSILLECTOMY     TOTAL ABDOMINAL HYSTERECTOMY W/ BILATERAL SALPINGOOPHORECTOMY     WISDOM TOOTH EXTRACTION     Family History  Problem Relation Age  of Onset   Kidney disease Mother    Heart disease Father    Cancer Father        bladder   Asthma Father    Allergies Father    Social History   Socioeconomic History   Marital status: Divorced    Spouse name: Not on file   Number of children: 0   Years of education: Not on file   Highest education level: Not on file  Occupational History   Not on file  Tobacco Use   Smoking status: Never   Smokeless tobacco: Never  Vaping Use   Vaping status: Never Used  Substance and Sexual Activity   Alcohol use: No    Alcohol/week: 0.0 standard drinks of alcohol   Drug use: No   Sexual activity: Not on file  Other Topics Concern   Not on file  Social History Narrative   States exercises 3x week   Drinks 1/2 cup caffeine a day   separated   Social Determinants of Health   Financial Resource Strain: Low Risk  (09/26/2022)   Overall Financial Resource Strain (CARDIA)    Difficulty of Paying Living Expenses: Not hard at all  Food Insecurity: No Food Insecurity (09/26/2022)   Hunger Vital Sign    Worried About Running Out of Food in the Last Year: Never true    Ran Out of Food in the Last Year: Never true  Transportation Needs: No Transportation Needs (09/26/2022)   PRAPARE - Administrator, Civil Service (Medical): No    Lack of Transportation (Non-Medical): No  Physical Activity: Insufficiently Active (12/19/2022)   Exercise Vital Sign    Days of Exercise per Week: 4 days    Minutes of Exercise per Session: 20 min  Stress: No Stress Concern Present (12/19/2022)   [employer] of Occupational Health - Occupational Stress Questionnaire    Feeling of Stress : Not at all  Social Connections: Moderately Isolated (12/19/2022)   Social Connection and Isolation Panel [NHANES]    Frequency of Communication with Friends and Family: More than three times a week    Frequency of Social Gatherings with Friends and Family: More than three times a week    Attends Religious Services:  Never    Database administrator or Organizations: Yes    Attends Engineer, structural: More than 4 times per year    Marital Status: Divorced    Tobacco Counseling Counseling given: Not Answered   Clinical Intake:  Pre-visit preparation completed: Yes  Pain : No/denies pain  BMI - recorded: 25.39 Nutritional Status: BMI 25 -29 Overweight Nutritional Risks: None Diabetes: No  How often do you need to have someone help you when you read instructions, pamphlets, or other written materials from your doctor or pharmacy?: 1 - Never  Interpreter Needed?: No  Information entered by :: Donne Anon, CMA   Activities of Daily Living    12/19/2022   10:22 AM 09/23/2022    6:46 PM  In your present state of health, do you have any difficulty performing the following activities:  Hearing? 0 0  Vision? 0 0  Difficulty concentrating or making decisions? 0 0  Walking or climbing stairs? 0 0  Dressing or bathing? 0 0  Doing errands, shopping? 0 0  Preparing Food and eating ? N   Using the Toilet? N   In the past six months, have you accidently leaked urine? N   Do you have problems with loss of bowel control? N   Managing your Medications? N   Managing your Finances? N   Housekeeping or managing your Housekeeping? N     Patient Care Team: Copland, Gwenlyn Found, MD as PCP - General (Family Medicine) Lynden Ang, NP as Nurse Practitioner (Obstetrics and Gynecology)  Indicate any recent Medical Services you may have received from other than [facility] providers in the past year (date may be approximate).     Assessment:   This is a routine wellness examination for Angeline.  Hearing/Vision screen No results found.  Dietary issues and exercise activities discussed:     Goals Addressed   None    Depression Screen    12/19/2022   10:28 AM 03/26/2022    8:12 AM 12/17/2021    8:27 AM 11/19/2021    1:08 PM 12/10/2020    9:44 AM 11/16/2019    8:12 AM 08/27/2016    8:32 AM  PHQ  2/9 Scores  PHQ - 2 Score 0 0 0 0 0 0 0    Fall Risk    12/19/2022   10:22 AM 03/26/2022    8:11 AM 12/17/2021    8:27 AM 11/19/2021    1:08 PM 12/10/2020    9:43 AM  Fall Risk   Falls in the past year? 0 0 0 0 0  Number falls in past yr: 0 0 0 0 0  Injury with Fall? 0 0 0 0 0  Risk for fall due to : No Fall Risks  No Fall Risks    Follow up Falls evaluation completed Falls evaluation completed Falls evaluation completed  Falls prevention discussed    MEDICARE RISK AT HOME:  Medicare Risk at Home - 12/19/22 1024     Any stairs in or around the home? No    If so, are there any without handrails? No    Home free of loose throw rugs in walkways, pet beds, electrical cords, etc? Yes    Adequate lighting in your home to reduce risk of falls? Yes    Life alert? No    Use of a cane, [place] or w/c? No    Grab bars in the bathroom? No    Shower chair or bench in shower? Yes   built in bench   Elevated toilet seat or a handicapped toilet? No             TIMED UP AND GO:  Was the test performed?  No    Cognitive Function:        12/19/2022   10:30 AM 12/17/2021    8:31 AM  6CIT Screen  What Year? 0 points 0 points  What month? 0 points 0 points  What time? 0 points 0 points  Count back from 20 0 points 0 points  Months in reverse 0 points 0 points  Repeat phrase 2 points 0 points  Total Score 2 points 0 points    Immunizations Immunization History  Administered Date(s) Administered   Fluad Quad(high Dose 65+) 01/11/2019   Influenza Inj Mdck Quad With Preservative 01/25/2022   Influenza Split 02/10/2011, 03/03/2012, 01/11/2014, 12/28/2015   Influenza Whole 01/31/2008, 02/28/2010   Influenza, High Dose Seasonal PF 12/27/2014, 01/04/2017, 01/21/2018   Influenza,inj,Quad PF,6+ Mos 01/17/2013   Influenza-Unspecified 01/04/2017, 01/21/2018   PFIZER Comirnaty(Gray Top)Covid-19 Tri-Sucrose Vaccine 09/05/2020   PFIZER(Purple Top)SARS-COV-2 Vaccination 08/04/2019, 08/25/2019    Pfizer Covid-19 Vaccine Bivalent Booster 5y-11y 01/10/2021   Pneumococcal Conjugate-13 12/22/2014, 11/29/2018   Pneumococcal Polysaccharide-23 02/17/2013, 09/22/2017   Tdap 07/10/2008, 07/31/2018   Zoster Recombinant(Shingrix) 09/22/2017, 11/22/2017    TDAP status: Up to date  Flu Vaccine status: Up to date  Pneumococcal vaccine status: Up to date  Covid-19 vaccine status: Information provided on how to obtain vaccines.   Qualifies for Shingles Vaccine? Yes   Zostavax completed No   Shingrix Completed?: Yes  Screening Tests Health Maintenance  Topic Date Due   Medicare Annual Wellness (AWV)  12/18/2022   MAMMOGRAM  04/13/2023   Fecal DNA (Cologuard)  10/28/2024   DTaP/Tdap/Td (3 - Td or Tdap) 07/30/2028   Pneumonia Vaccine 53+ Years old  Completed   DEXA SCAN  Completed   Hepatitis C Screening  Completed   Zoster Vaccines- Shingrix  Completed   HPV VACCINES  Aged Out   COVID-19 Vaccine  Discontinued    Health Maintenance  Health Maintenance Due  Topic Date Due   Medicare Annual Wellness (AWV)  12/18/2022    Colorectal cancer screening: Type of screening: Cologuard. Completed 10/28/21. Repeat every 3 years  Mammogram status: Completed 04/12/22. Repeat every year  Bone Density status: Completed 05/25/20. Results reflect: Bone density results: OSTEOPENIA. Repeat every 2 years.  Lung Cancer Screening: (Low Dose CT Chest recommended if Age 14-80 years, 20 pack-year currently smoking OR have quit w/in 15years.) does not qualify.   Additional Screening:  Hepatitis C Screening: does qualify; Completed 08/27/16  Vision Screening: Recommended annual ophthalmology exams for early detection of glaucoma and other disorders of the eye. Is the patient up to date with their annual eye exam?  Yes  Who is the provider or what is the name of the office in which the patient attends annual eye exams? Dr. Ace Gins If pt is not established with a provider, would they like to be  referred to a provider to establish care? No .   Dental Screening: Recommended annual dental exams for proper oral hygiene  Diabetic Foot Exam: N/a  Community Resource Referral / Chronic Care Management: CRR required this visit?  No   CCM required this visit?  No     Plan:     I have personally reviewed and noted the following in the patient's chart:   Medical and social history Use of alcohol, tobacco or illicit drugs  Current medications and supplements including opioid prescriptions. Patient is not currently taking opioid prescriptions. Functional ability and status Nutritional status Physical activity Advanced directives List of other physicians Hospitalizations, surgeries, and ER visits in previous 12 months Vitals Screenings to include cognitive, depression, and falls Referrals and appointments  In addition, I have reviewed and discussed with patient certain preventive protocols, quality metrics, and best practice recommendations. A written personalized care plan for preventive services as well as general preventive health recommendations were provided to patient.     Donne Anon, CMA   12/19/2022   After  Visit Summary: (MyChart) Due to this being a telephonic visit, the after visit summary with patients personalized plan was offered to patient via MyChart   Nurse Notes: None

## 2023-01-03 ENCOUNTER — Other Ambulatory Visit: Payer: Self-pay | Admitting: Family Medicine

## 2023-01-03 DIAGNOSIS — I10 Essential (primary) hypertension: Secondary | ICD-10-CM

## 2023-01-20 ENCOUNTER — Other Ambulatory Visit: Payer: Self-pay | Admitting: Family Medicine

## 2023-01-20 DIAGNOSIS — J301 Allergic rhinitis due to pollen: Secondary | ICD-10-CM

## 2023-01-20 DIAGNOSIS — E785 Hyperlipidemia, unspecified: Secondary | ICD-10-CM

## 2023-01-25 DIAGNOSIS — R0981 Nasal congestion: Secondary | ICD-10-CM | POA: Diagnosis not present

## 2023-01-25 DIAGNOSIS — Z20822 Contact with and (suspected) exposure to covid-19: Secondary | ICD-10-CM | POA: Diagnosis not present

## 2023-01-28 DIAGNOSIS — R0981 Nasal congestion: Secondary | ICD-10-CM | POA: Diagnosis not present

## 2023-01-28 DIAGNOSIS — J309 Allergic rhinitis, unspecified: Secondary | ICD-10-CM | POA: Diagnosis not present

## 2023-01-28 DIAGNOSIS — Z20822 Contact with and (suspected) exposure to covid-19: Secondary | ICD-10-CM | POA: Diagnosis not present

## 2023-02-02 DIAGNOSIS — Z20822 Contact with and (suspected) exposure to covid-19: Secondary | ICD-10-CM | POA: Diagnosis not present

## 2023-02-02 DIAGNOSIS — J309 Allergic rhinitis, unspecified: Secondary | ICD-10-CM | POA: Diagnosis not present

## 2023-02-02 DIAGNOSIS — R0981 Nasal congestion: Secondary | ICD-10-CM | POA: Diagnosis not present

## 2023-02-05 DIAGNOSIS — J309 Allergic rhinitis, unspecified: Secondary | ICD-10-CM | POA: Diagnosis not present

## 2023-02-05 DIAGNOSIS — R0981 Nasal congestion: Secondary | ICD-10-CM | POA: Diagnosis not present

## 2023-02-05 DIAGNOSIS — Z20822 Contact with and (suspected) exposure to covid-19: Secondary | ICD-10-CM | POA: Diagnosis not present

## 2023-02-09 DIAGNOSIS — Z20822 Contact with and (suspected) exposure to covid-19: Secondary | ICD-10-CM | POA: Diagnosis not present

## 2023-02-09 DIAGNOSIS — J309 Allergic rhinitis, unspecified: Secondary | ICD-10-CM | POA: Diagnosis not present

## 2023-02-09 DIAGNOSIS — R0981 Nasal congestion: Secondary | ICD-10-CM | POA: Diagnosis not present

## 2023-02-11 DIAGNOSIS — F341 Dysthymic disorder: Secondary | ICD-10-CM | POA: Diagnosis not present

## 2023-02-11 DIAGNOSIS — G4700 Insomnia, unspecified: Secondary | ICD-10-CM | POA: Diagnosis not present

## 2023-02-12 DIAGNOSIS — R0981 Nasal congestion: Secondary | ICD-10-CM | POA: Diagnosis not present

## 2023-02-12 DIAGNOSIS — Z20822 Contact with and (suspected) exposure to covid-19: Secondary | ICD-10-CM | POA: Diagnosis not present

## 2023-02-12 DIAGNOSIS — J309 Allergic rhinitis, unspecified: Secondary | ICD-10-CM | POA: Diagnosis not present

## 2023-02-17 DIAGNOSIS — Z20822 Contact with and (suspected) exposure to covid-19: Secondary | ICD-10-CM | POA: Diagnosis not present

## 2023-02-17 DIAGNOSIS — J309 Allergic rhinitis, unspecified: Secondary | ICD-10-CM | POA: Diagnosis not present

## 2023-02-17 DIAGNOSIS — R0981 Nasal congestion: Secondary | ICD-10-CM | POA: Diagnosis not present

## 2023-02-20 DIAGNOSIS — R0981 Nasal congestion: Secondary | ICD-10-CM | POA: Diagnosis not present

## 2023-02-20 DIAGNOSIS — Z20822 Contact with and (suspected) exposure to covid-19: Secondary | ICD-10-CM | POA: Diagnosis not present

## 2023-02-20 DIAGNOSIS — J309 Allergic rhinitis, unspecified: Secondary | ICD-10-CM | POA: Diagnosis not present

## 2023-02-26 DIAGNOSIS — J309 Allergic rhinitis, unspecified: Secondary | ICD-10-CM | POA: Diagnosis not present

## 2023-02-26 DIAGNOSIS — Z20822 Contact with and (suspected) exposure to covid-19: Secondary | ICD-10-CM | POA: Diagnosis not present

## 2023-03-17 ENCOUNTER — Other Ambulatory Visit: Payer: Self-pay | Admitting: Family Medicine

## 2023-03-17 DIAGNOSIS — L309 Dermatitis, unspecified: Secondary | ICD-10-CM

## 2023-03-18 DIAGNOSIS — Z01419 Encounter for gynecological examination (general) (routine) without abnormal findings: Secondary | ICD-10-CM | POA: Diagnosis not present

## 2023-03-27 ENCOUNTER — Other Ambulatory Visit: Payer: Self-pay

## 2023-03-27 DIAGNOSIS — I10 Essential (primary) hypertension: Secondary | ICD-10-CM

## 2023-03-27 MED ORDER — LOSARTAN POTASSIUM 100 MG PO TABS
100.0000 mg | ORAL_TABLET | Freq: Every day | ORAL | 0 refills | Status: DC
Start: 2023-03-27 — End: 2023-05-27

## 2023-04-16 DIAGNOSIS — Z1231 Encounter for screening mammogram for malignant neoplasm of breast: Secondary | ICD-10-CM | POA: Diagnosis not present

## 2023-04-16 DIAGNOSIS — M8588 Other specified disorders of bone density and structure, other site: Secondary | ICD-10-CM | POA: Diagnosis not present

## 2023-04-16 LAB — HM MAMMOGRAPHY

## 2023-04-16 LAB — HM DEXA SCAN

## 2023-04-28 DIAGNOSIS — E119 Type 2 diabetes mellitus without complications: Secondary | ICD-10-CM | POA: Diagnosis not present

## 2023-05-04 ENCOUNTER — Ambulatory Visit: Payer: Medicare HMO | Admitting: Family Medicine

## 2023-05-20 ENCOUNTER — Telehealth: Payer: Self-pay | Admitting: *Deleted

## 2023-05-20 NOTE — Telephone Encounter (Signed)
 LMOM notifying pt of dexa results.

## 2023-05-20 NOTE — Telephone Encounter (Signed)
 Pt returned call and was given Dexa results.  A copy was mailed to her at her request.

## 2023-05-26 NOTE — Patient Instructions (Addendum)
 Good to see you again today!  I will be in touch with your labs asap  We will start you on Fosamax weekly for your bone density-  rescan in 2 years Let me know if any side effects or other concerns

## 2023-05-26 NOTE — Progress Notes (Signed)
 [facility] [address] [place], [place] [address] [phone]  Date:  05/27/2023   Name:  Alicia Wallace   DOB:  1946/01/09   MRN:  [id number]  PCP:  Kaylee Partridge, MD    Chief Complaint: 6 month follow up (Concerns/ questions: none)   History of Present Illness:  Alicia Wallace is a 78 y.o. very pleasant female patient who presents with the following:  Pt seen today for periodic recheck Last seen by myself in May - History of hypertension, hypothyroidism, hyperlipidemia, prediabetes    She had pneumonia over the summer and we did a recheck CT in June- IMPRESSION: 1. Near complete clearing of previously seen bilateral lower lobe pneumonia. No further follow-up is recommended. 2. No acute cardiopulmonary findings. 3. Other nonacute observations, as described above  Labs updated in June- needs A1c today, TSH- she is fasting this am  Lab Results  Component Value Date   HGBA1C 6.3 03/26/2022   Levothyroxine  100 Losartan  100 Lopressor  50 BID Nortriptyline  100 at bedtime Ambien     She had a bone density scan in December- osteopenia with hip fracture risk over 3% She has not yet started on medication but would be interested in doing so.  It sounds like she did take a bisphosphonate many years ago, she tolerated this well and it was eventually stopped.  She is okay with using oral Fosamax  once a week.  She notes no difficulty with swallowing  Patient Active Problem List   Diagnosis Date Noted   Community acquired pneumonia 09/24/2022   Pneumonia 09/23/2022   AKI (acute kidney injury) (HCC) 09/23/2022   Prediabetes 06/03/2021   Other and unspecified hyperlipidemia 08/22/2013   HTN (hypertension) 04/06/2012   Osteopenia 04/06/2012   Seasonal and perennial allergic rhinitis 07/15/2010   Hypothyroidism 05/30/2008   Asthma, mild intermittent 07/14/2007    Past Medical History:  Diagnosis Date   Allergic  rhinitis    Asthma    Hypertension    Hypothyroid     Past Surgical History:  Procedure Laterality Date   APPENDECTOMY     CHOLECYSTECTOMY     TONSILLECTOMY     TOTAL ABDOMINAL HYSTERECTOMY W/ BILATERAL SALPINGOOPHORECTOMY     WISDOM TOOTH EXTRACTION      Social History   Tobacco Use   Smoking status: Never   Smokeless tobacco: Never  Vaping Use   Vaping status: Never Used  Substance Use Topics   Alcohol use: No    Alcohol/week: 0.0 standard drinks of alcohol   Drug use: No    Family History  Problem Relation Age of Onset   Kidney disease Mother    Heart disease Father    Cancer Father        bladder   Asthma Father    Allergies Father     Allergies  Allergen Reactions   Influenza Vac Split Quad Swelling and Other (See Comments)    Size of grapefruit red, swollen area at injection site  Leg cramps  Vaccine given was Fluarix Quadrivalent 2014/2015 formula by [employer]   Penicillins Hives    Has patient had a PCN reaction causing immediate rash, facial/tongue/throat swelling, SOB or lightheadedness with hypotension: yes Has patient had a PCN reaction causing severe rash involving mucus membranes or skin necrosis: no Has patient had a PCN reaction that required hospitalization: no Has patient had a PCN reaction occurring within the last 10 years: no If  all of the above answers are "NO", then may proceed with Cephalosporin use.    Sulfonamide Derivatives Hives    Medication list has been reviewed and updated.  Current Outpatient Medications on File Prior to Visit  Medication Sig Dispense Refill   ALAWAY 0.035 % ophthalmic solution Place 1 drop into both eyes in the morning.     albuterol  (VENTOLIN  HFA) 108 (90 Base) MCG/ACT inhaler TAKE 2 PUFFS BY MOUTH EVERY 6 HOURS AS NEEDED FOR WHEEZE OR SHORTNESS OF BREATH (Patient taking differently: Inhale 2 puffs into the lungs every 6 (six) hours as needed for wheezing or shortness of breath.) 8.5 each 3   Cholecalciferol  (VITAMIN D3) 25 MCG (1000 UT) CHEW Chew 1,500 Units by mouth daily.     estradiol  (CLIMARA  - DOSED IN MG/24 HR) 0.025 mg/24hr patch Place 0.025 mg onto the skin every Thursday.  2   levocetirizine (XYZAL ) 5 MG tablet Take 1 tablet every day in the evening. 90 tablet 3   levothyroxine  (SYNTHROID ) 100 MCG tablet Take 1 tablet (100 mcg total) by mouth daily before breakfast. 90 tablet 1   losartan  (COZAAR ) 100 MG tablet Take 1 tablet (100 mg total) by mouth daily. 90 tablet 0   metoprolol  tartrate (LOPRESSOR ) 50 MG tablet TAKE 1 TABLET TWICE DAILY 180 tablet 3   nortriptyline  (PAMELOR ) 10 MG capsule Take 2 at bedtime as needed (Patient taking differently: Take 20 mg by mouth at bedtime.) 180 capsule 3   promethazine -dextromethorphan (PROMETHAZINE -DM) 6.25-15 MG/5ML syrup TAKE FOUR TIMES DAILY AS NEEDED FOR COUGH (Patient taking differently: Take 5 mLs by mouth 4 (four) times daily as needed for cough.) 118 mL 0   triamcinolone  cream (KENALOG ) 0.1 % APPLY TOPICLLY TO THE AFFECTED AREA(S) TWO TIMES DAILY AS NEEDED 80 g 11   zolpidem  (AMBIEN ) 10 MG tablet Take 1/2 tablet at bedtime as needed for sleep. May take 1 tablet if necessary (Patient taking differently: Take 10 mg by mouth at bedtime.) 30 tablet 5   No current facility-administered medications on file prior to visit.    Review of Systems:  As per HPI- otherwise negative.   Physical Examination: Vitals:   05/27/23 0817  BP: 110/72  Pulse: 74  Resp: 18  Temp: 97.8 F (36.6 C)  SpO2: 98%   Vitals:   05/27/23 0817  Weight: 151 lb 9.6 oz (68.8 kg)  Height: 5' (1.524 m)   Body mass index is 29.61 kg/m. Ideal Body Weight: Weight in (lb) to have BMI = 25: 127.7  GEN: no acute distress.  Overweight, looks well HEENT: Atraumatic, Normocephalic.  Bilateral TM wnl, oropharynx normal.  PEERL,EOMI.   Ears and Nose: No external deformity. CV: RRR, No M/G/R. No JVD. No thrill. No extra heart sounds. PULM: CTA B, no wheezes, crackles,  rhonchi. No retractions. No resp. distress. No accessory muscle use. ABD: S, NT, ND. No rebound. No HSM. EXTR: No c/c/e PSYCH: Normally interactive. Conversant.    Assessment and Plan: Essential hypertension - Plan: Basic metabolic panel, CBC, losartan  (COZAAR ) 100 MG tablet  Anemia, unspecified type - Plan: CBC  Dyslipidemia - Plan: Lipid panel  Hypothyroidism due to acquired atrophy of thyroid  - Plan: TSH  Prediabetes - Plan: Hemoglobin A1c  Seasonal allergic rhinitis due to pollen - Plan: promethazine -dextromethorphan (PROMETHAZINE -DM) 6.25-15 MG/5ML syrup  Osteopenia, unspecified location - Plan: alendronate  (FOSAMAX ) 70 MG tablet  Patient seen today for periodic follow-up.  Blood pressure is under good control on current regimen Lab work is pending  as above Patient likes to keep a Phenergan  DM prescription on hand for cough, refilled this for her today Started Fosamax , plan to rescan in 2 years. Refill thyroid  med once her labs come in  Signed Gates Kasal, MD  Received labs as below, message to patient Thyroid  is in range, refilled her levothyroxine  Results for orders placed or performed in visit on 05/27/23  Hemoglobin A1c   Collection Time: 05/27/23  8:40 AM  Result Value Ref Range   Hgb A1c MFr Bld 6.0 4.6 - 6.5 %  TSH   Collection Time: 05/27/23  8:40 AM  Result Value Ref Range   TSH 0.95 0.35 - 5.50 uIU/mL  Basic metabolic panel   Collection Time: 05/27/23  8:40 AM  Result Value Ref Range   Sodium 138 135 - 145 mEq/L   Potassium 4.2 3.5 - 5.1 mEq/L   Chloride 103 96 - 112 mEq/L   CO2 26 19 - 32 mEq/L   Glucose, Bld 88 70 - 99 mg/dL   BUN 15 6 - 23 mg/dL   Creatinine, Ser 4.09 0.40 - 1.20 mg/dL   GFR 81.19 >14.78 mL/min   Calcium  9.2 8.4 - 10.5 mg/dL  CBC   Collection Time: 05/27/23  8:40 AM  Result Value Ref Range   WBC 8.7 4.0 - 10.5 K/uL   RBC 4.29 3.87 - 5.11 Mil/uL   Platelets 292.0 150.0 - 400.0 K/uL   Hemoglobin 13.0 12.0 - 15.0 g/dL    HCT 29.5 62.1 - 30.8 %   MCV 93.5 78.0 - 100.0 fl   MCHC 32.4 30.0 - 36.0 g/dL   RDW 65.7 84.6 - 96.2 %  Lipid panel   Collection Time: 05/27/23  8:40 AM  Result Value Ref Range   Cholesterol 236 (H) 0 - 200 mg/dL   Triglycerides 952.8 0.0 - 149.0 mg/dL   HDL 41.32 >44.01 mg/dL   VLDL 02.7 0.0 - 25.3 mg/dL   LDL Cholesterol 664 (H) 0 - 99 mg/dL   Total CHOL/HDL Ratio 4    NonHDL 176.77

## 2023-05-27 ENCOUNTER — Encounter: Payer: Self-pay | Admitting: Family Medicine

## 2023-05-27 ENCOUNTER — Ambulatory Visit (INDEPENDENT_AMBULATORY_CARE_PROVIDER_SITE_OTHER): Payer: Medicare HMO | Admitting: Family Medicine

## 2023-05-27 VITALS — BP 110/72 | HR 74 | Temp 97.8°F | Resp 18 | Ht 60.0 in | Wt 151.6 lb

## 2023-05-27 DIAGNOSIS — E034 Atrophy of thyroid (acquired): Secondary | ICD-10-CM | POA: Diagnosis not present

## 2023-05-27 DIAGNOSIS — M8580 Other specified disorders of bone density and structure, unspecified site: Secondary | ICD-10-CM | POA: Diagnosis not present

## 2023-05-27 DIAGNOSIS — R7303 Prediabetes: Secondary | ICD-10-CM

## 2023-05-27 DIAGNOSIS — D649 Anemia, unspecified: Secondary | ICD-10-CM

## 2023-05-27 DIAGNOSIS — I10 Essential (primary) hypertension: Secondary | ICD-10-CM

## 2023-05-27 DIAGNOSIS — J301 Allergic rhinitis due to pollen: Secondary | ICD-10-CM

## 2023-05-27 DIAGNOSIS — E785 Hyperlipidemia, unspecified: Secondary | ICD-10-CM | POA: Diagnosis not present

## 2023-05-27 LAB — CBC
HCT: 40.1 % (ref 36.0–46.0)
Hemoglobin: 13 g/dL (ref 12.0–15.0)
MCHC: 32.4 g/dL (ref 30.0–36.0)
MCV: 93.5 fL (ref 78.0–100.0)
Platelets: 292 10*3/uL (ref 150.0–400.0)
RBC: 4.29 Mil/uL (ref 3.87–5.11)
RDW: 14.6 % (ref 11.5–15.5)
WBC: 8.7 10*3/uL (ref 4.0–10.5)

## 2023-05-27 LAB — LIPID PANEL
Cholesterol: 236 mg/dL — ABNORMAL HIGH (ref 0–200)
HDL: 59.5 mg/dL (ref >39.00)
LDL Cholesterol: 150 mg/dL — ABNORMAL HIGH (ref 0–99)
NonHDL: 176.77
Total CHOL/HDL Ratio: 4
Triglycerides: 136 mg/dL (ref 0.0–149.0)
VLDL: 27.2 mg/dL (ref 0.0–40.0)

## 2023-05-27 LAB — BASIC METABOLIC PANEL
BUN: 15 mg/dL (ref 6–23)
CO2: 26 meq/L (ref 19–32)
Calcium: 9.2 mg/dL (ref 8.4–10.5)
Chloride: 103 meq/L (ref 96–112)
Creatinine, Ser: 0.6 mg/dL (ref 0.40–1.20)
GFR: 87.56 mL/min (ref >60.00)
Glucose, Bld: 88 mg/dL (ref 70–99)
Potassium: 4.2 meq/L (ref 3.5–5.1)
Sodium: 138 meq/L (ref 135–145)

## 2023-05-27 LAB — TSH: TSH: 0.95 u[IU]/mL (ref 0.35–5.50)

## 2023-05-27 LAB — HEMOGLOBIN A1C: Hgb A1c MFr Bld: 6 % (ref 4.6–6.5)

## 2023-05-27 MED ORDER — PROMETHAZINE-DM 6.25-15 MG/5ML PO SYRP
5.0000 mL | ORAL_SOLUTION | Freq: Four times a day (QID) | ORAL | 0 refills | Status: DC | PRN
Start: 2023-05-27 — End: 2023-10-14

## 2023-05-27 MED ORDER — LEVOTHYROXINE SODIUM 100 MCG PO TABS: 100.0000 ug | ORAL_TABLET | Freq: Every day | ORAL | 3 refills | Status: DC

## 2023-05-27 MED ORDER — ALENDRONATE SODIUM 70 MG PO TABS
70.0000 mg | ORAL_TABLET | ORAL | 3 refills | Status: DC
Start: 2023-05-27 — End: 2024-02-24

## 2023-05-27 MED ORDER — LOSARTAN POTASSIUM 100 MG PO TABS: 100.0000 mg | ORAL_TABLET | Freq: Every day | ORAL | 3 refills | Status: DC

## 2023-05-28 MED ORDER — ROSUVASTATIN CALCIUM 10 MG PO TABS
10.0000 mg | ORAL_TABLET | Freq: Every day | ORAL | 3 refills | Status: AC
Start: 2023-05-28 — End: ?

## 2023-06-03 ENCOUNTER — Other Ambulatory Visit: Payer: Self-pay | Admitting: Family Medicine

## 2023-06-03 DIAGNOSIS — J301 Allergic rhinitis due to pollen: Secondary | ICD-10-CM

## 2023-10-14 ENCOUNTER — Other Ambulatory Visit: Payer: Self-pay | Admitting: Family Medicine

## 2023-10-14 DIAGNOSIS — J301 Allergic rhinitis due to pollen: Secondary | ICD-10-CM

## 2023-10-18 ENCOUNTER — Other Ambulatory Visit: Payer: Self-pay | Admitting: Family Medicine

## 2023-10-18 DIAGNOSIS — I10 Essential (primary) hypertension: Secondary | ICD-10-CM

## 2023-10-22 DIAGNOSIS — Z5181 Encounter for therapeutic drug level monitoring: Secondary | ICD-10-CM | POA: Diagnosis not present

## 2023-10-22 DIAGNOSIS — Z79899 Other long term (current) drug therapy: Secondary | ICD-10-CM | POA: Diagnosis not present

## 2023-10-27 ENCOUNTER — Telehealth: Payer: Self-pay | Admitting: Family Medicine

## 2023-10-27 DIAGNOSIS — J301 Allergic rhinitis due to pollen: Secondary | ICD-10-CM

## 2023-10-27 MED ORDER — LEVOCETIRIZINE DIHYDROCHLORIDE 5 MG PO TABS: 5.0000 mg | ORAL_TABLET | Freq: Every evening | ORAL | 1 refills | Status: DC

## 2023-10-27 NOTE — Telephone Encounter (Signed)
 Copied from CRM [id number]. Topic: Clinical - Medication Refill >> Oct 27, 2023  9:52 AM Vivian Z wrote: Medication: levocetirizine (XYZAL ) 5 MG tablet  Has the patient contacted their pharmacy? Yes (Agent: If no, request that the patient contact the pharmacy for the refill. If patient does not wish to contact the pharmacy document the reason why and proceed with request.) (Agent: If yes, when and what did the pharmacy advise?)  This is the patient's preferred pharmacy:  [facility] - [place], [place] - [address] [place] [address] Phone: [phone] Fax: [phone]  Is this the correct pharmacy for this prescription? Yes If no, delete pharmacy and type the correct one.   Has the prescription been filled recently? No  Is the patient out of the medication? No  Has the patient been seen for an appointment in the last year OR does the patient have an upcoming appointment? Yes  Can we respond through MyChart? No  Agent: Please be advised that Rx refills may take up to 3 business days. We ask that you follow-up with your pharmacy.

## 2023-11-07 ENCOUNTER — Other Ambulatory Visit: Payer: Self-pay | Admitting: Family Medicine

## 2023-11-07 DIAGNOSIS — J301 Allergic rhinitis due to pollen: Secondary | ICD-10-CM

## 2023-12-10 ENCOUNTER — Telehealth: Payer: Self-pay | Admitting: Family Medicine

## 2023-12-10 NOTE — Telephone Encounter (Unsigned)
 Copied from CRM [id number]. Topic: General - Other >> Dec 10, 2023  9:46 AM Antonio DEL wrote: Reason for CRM: Outbound call to patient, no answer, left voicemail. If patient calls back, her appointment for August 11 needs to be corrected. Appointment is for an annual wellness visit and is scheduled with a provider. AWV should not be scheduled with a provider but with a wellness coach. Needs to be scheduled on the schedule for [facility] Annual Well Visit 1.

## 2023-12-21 ENCOUNTER — Ambulatory Visit: Admitting: Physician Assistant

## 2023-12-29 DIAGNOSIS — F341 Dysthymic disorder: Secondary | ICD-10-CM | POA: Diagnosis not present

## 2023-12-30 ENCOUNTER — Telehealth: Payer: Self-pay | Admitting: *Deleted

## 2023-12-30 ENCOUNTER — Ambulatory Visit (INDEPENDENT_AMBULATORY_CARE_PROVIDER_SITE_OTHER): Admitting: *Deleted

## 2023-12-30 VITALS — Ht 60.0 in | Wt 135.0 lb

## 2023-12-30 DIAGNOSIS — Z0000 Encounter for general adult medical examination without abnormal findings: Secondary | ICD-10-CM | POA: Diagnosis not present

## 2023-12-30 DIAGNOSIS — G4700 Insomnia, unspecified: Secondary | ICD-10-CM

## 2023-12-30 NOTE — Patient Instructions (Signed)
 Alicia Wallace , Thank you for taking time out of your busy schedule to complete your Annual Wellness Visit with me. I enjoyed our conversation and look forward to speaking with you again next year. I, as well as your care team,  appreciate your ongoing commitment to your health goals. Please review the following plan we discussed and let me know if I can assist you in the future. Your Game plan/ To Do List   Please remember to schedule your mammogram at [facility] after 04/14/24.  Follow up Visits: Next Medicare AWV with our clinical staff: 12/30/24 8:20am    Next Office Visit with your provider: Please call to schedule a follow up with Dr Watt soon.  Clinician Recommendations:  Aim for 30 minutes of exercise or brisk walking, 6-8 glasses of water, and 5 servings of fruits and vegetables each day.       This is a list of the screening recommended for you and due dates:  Health Maintenance  Topic Date Due   Mammogram  04/15/2024   Medicare Annual Wellness Visit  12/29/2024   DTaP/Tdap/Td vaccine (3 - Td or Tdap) 07/30/2028   Pneumococcal Vaccine for age over 52  Completed   DEXA scan (bone density measurement)  Completed   Hepatitis C Screening  Completed   Zoster (Shingles) Vaccine  Completed   HPV Vaccine  Aged Out   Meningitis B Vaccine  Aged Out   COVID-19 Vaccine  Discontinued   Cologuard (Stool DNA test)  Discontinued    Advanced directives: (Copy Requested) Please bring a copy of your health care power of attorney and living will to the office to be added to your chart at your convenience. You can mail to [facility] [address] [place], [place] [address] or email to [email or web address] Advance Care Planning is important because it:  [x]  Makes sure you receive the medical care that is consistent with your values, goals, and preferences  [x]  It provides guidance to your family and loved ones and reduces their decisional burden about whether or not they are  making the right decisions based on your wishes.  Follow the link provided in your after visit summary or read over the paperwork we have mailed to you to help you started getting your Advance Directives in place. If you need assistance in completing these, please reach out to us  so that we can help you!  See attachments for Preventive Care and Fall Prevention Tips.

## 2023-12-30 NOTE — Progress Notes (Signed)
 Subjective:   Alicia Wallace is a 78 y.o. who presents for a Medicare Wellness preventive visit.  As a reminder, Annual Wellness Visits don't include a physical exam, and some assessments may be limited, especially if this visit is performed virtually. We may recommend an in-person follow-up visit with your provider if needed.  Visit Complete: Virtual I connected with  Alicia Wallace on 12/30/23 by a audio enabled telemedicine application and verified that I am speaking with the correct person using two identifiers.  Patient Location: Home  Provider Location: Office/Clinic  I discussed the limitations of evaluation and management by telemedicine. The patient expressed understanding and agreed to proceed.  Vital Signs: Because this visit was a virtual/telehealth visit, some criteria may be missing or patient reported. Any vitals not documented were not able to be obtained and vitals that have been documented are patient reported.  VideoDeclined- This patient declined Librarian, academic. Therefore the visit was completed with audio only.  Persons Participating in Visit: Patient.  AWV Questionnaire: No: Patient Medicare AWV questionnaire was not completed prior to this visit.  Cardiac Risk Factors include: advanced age (>22men, >65 women);dyslipidemia;hypertension     Objective:    Today's Vitals   12/30/23 0820  Weight: 135 lb (61.2 kg)  Height: 5' (1.524 m)   Body mass index is 26.37 kg/m.     12/30/2023    8:34 AM 12/19/2022   10:29 AM 09/23/2022    7:10 PM 09/23/2022    6:38 AM 12/17/2021    8:26 AM 12/10/2020    9:42 AM 11/16/2019    8:05 AM  Advanced Directives  Does Patient Have a Medical Advance Directive? Yes Yes  No No No Yes  Type of Advance Directive Living will Healthcare Power of [place];Living will     Healthcare Power of [place];Living will  Does patient want to make changes to medical advance directive? No - Patient declined No -  Patient declined     No - Patient declined  Copy of Healthcare Power of Attorney in Chart?  No - copy requested     No - copy requested  Would patient like information on creating a medical advance directive?   No - Patient declined  No - Patient declined No - Patient declined     Current Medications (verified) Outpatient Encounter Medications as of 12/30/2023  Medication Sig   ALAWAY 0.035 % ophthalmic solution Place 1 drop into both eyes in the morning.   albuterol  (VENTOLIN  HFA) 108 (90 Base) MCG/ACT inhaler TAKE 2 PUFFS BY MOUTH EVERY 6 HOURS AS NEEDED FOR WHEEZE OR SHORTNESS OF BREATH   alendronate  (FOSAMAX ) 70 MG tablet Take 1 tablet (70 mg total) by mouth every 7 (seven) days. Take with a full glass of water on an empty stomach.   Cholecalciferol (VITAMIN D3) 25 MCG (1000 UT) CHEW Chew 1,500 Units by mouth daily.   estradiol  (CLIMARA  - DOSED IN MG/24 HR) 0.025 mg/24hr patch Place 0.025 mg onto the skin every Thursday.   levocetirizine (XYZAL ) 5 MG tablet Take 1 tablet (5 mg total) by mouth every evening.   levothyroxine  (SYNTHROID ) 100 MCG tablet Take 1 tablet (100 mcg total) by mouth daily before breakfast.   losartan  (COZAAR ) 100 MG tablet Take 1 tablet (100 mg total) by mouth daily.   metoprolol  tartrate (LOPRESSOR ) 50 MG tablet Take 1 tablet (50 mg total) by mouth 2 (two) times daily.   rosuvastatin  (CRESTOR ) 10 MG tablet Take 1 tablet (10  mg total) by mouth daily.   triamcinolone  cream (KENALOG ) 0.1 % APPLY TOPICLLY TO THE AFFECTED AREA(S) TWO TIMES DAILY AS NEEDED   zolpidem  (AMBIEN ) 10 MG tablet Take 1/2 tablet at bedtime as needed for sleep. May take 1 tablet if necessary   nortriptyline  (PAMELOR ) 10 MG capsule Take 2 at bedtime as needed (Patient taking differently: Take 20 mg by mouth at bedtime.)   promethazine -dextromethorphan (PROMETHAZINE -DM) 6.25-15 MG/5ML syrup TAKE 5 MLS BY MOUTH 4 (FOUR) TIMES DAILY AS NEEDED FOR COUGH.   No facility-administered encounter medications  on file as of 12/30/2023.    Allergies (verified) Influenza vac split quad, Penicillins, and Sulfonamide derivatives   History: Past Medical History:  Diagnosis Date   Allergic rhinitis    Asthma    Hypertension    Hypothyroid    Past Surgical History:  Procedure Laterality Date   APPENDECTOMY     CHOLECYSTECTOMY     TONSILLECTOMY     TOTAL ABDOMINAL HYSTERECTOMY W/ BILATERAL SALPINGOOPHORECTOMY     WISDOM TOOTH EXTRACTION     Family History  Problem Relation Age of Onset   Kidney disease Mother    Heart disease Father    Cancer Father        bladder   Asthma Father    Allergies Father    Social History   Socioeconomic History   Marital status: Divorced    Spouse name: Not on file   Number of children: 0   Years of education: Not on file   Highest education level: Not on file  Occupational History   Not on file  Tobacco Use   Smoking status: Never   Smokeless tobacco: Never  Vaping Use   Vaping status: Never Used  Substance and Sexual Activity   Alcohol use: No    Alcohol/week: 0.0 standard drinks of alcohol   Drug use: No   Sexual activity: Not on file  Other Topics Concern   Not on file  Social History Narrative   States exercises 3x week   Drinks 1/2 cup caffeine a day   separated   Social Drivers of Corporate investment banker Strain: Low Risk  (12/30/2023)   Overall Financial Resource Strain (CARDIA)    Difficulty of Paying Living Expenses: Not hard at all  Food Insecurity: No Food Insecurity (12/30/2023)   Hunger Vital Sign    Worried About Running Out of Food in the Last Year: Never true    Ran Out of Food in the Last Year: Never true  Transportation Needs: No Transportation Needs (12/30/2023)   PRAPARE - Administrator, Civil Service (Medical): No    Lack of Transportation (Non-Medical): No  Physical Activity: Insufficiently Active (12/30/2023)   Exercise Vital Sign    Days of Exercise per Week: 4 days    Minutes of Exercise per  Session: 20 min  Stress: No Stress Concern Present (12/30/2023)   [employer] of Occupational Health - Occupational Stress Questionnaire    Feeling of Stress: Not at all  Social Connections: Moderately Integrated (12/30/2023)   Social Connection and Isolation Panel    Frequency of Communication with Friends and Family: More than three times a week    Frequency of Social Gatherings with Friends and Family: Three times a week    Attends Religious Services: More than 4 times per year    Active Member of Clubs or Organizations: No    Attends Banker Meetings: 1 to 4 times per year  Marital Status: Divorced    Tobacco Counseling Counseling given: Not Answered    Clinical Intake:  Pre-visit preparation completed: Yes  Pain : No/denies pain     BMI - recorded: 26.37 Nutritional Status: BMI 25 -29 Overweight Nutritional Risks: None Diabetes: No  Lab Results  Component Value Date   HGBA1C 6.0 05/27/2023   HGBA1C 6.3 03/26/2022   HGBA1C 6.1 06/03/2021     How often do you need to have someone help you when you read instructions, pamphlets, or other written materials from your doctor or pharmacy?: 1 - Never What is the last grade level you completed in school?: associate's degree  Interpreter Needed?: No  Information entered by :: Lolita Libra, CMA   Activities of Daily Living     12/30/2023    8:29 AM  In your present state of health, do you have any difficulty performing the following activities:  Hearing? 0  Vision? 0  Difficulty concentrating or making decisions? 0  Walking or climbing stairs? 0  Dressing or bathing? 0  Doing errands, shopping? 0  Preparing Food and eating ? N  Using the Toilet? N  In the past six months, have you accidently leaked urine? N  Do you have problems with loss of bowel control? N  Managing your Medications? N  Managing your Finances? N  Housekeeping or managing your Housekeeping? N    Patient Care  Team: Copland, Harlene BROCKS, MD as PCP - General (Family Medicine) Stuart Norris, NP as Nurse Practitioner (Obstetrics and Gynecology) [facility]  I have updated your Care Teams any recent Medical Services you may have received from other providers in the past year.     Assessment:   This is a routine wellness examination for Alicia Wallace.  Hearing/Vision screen Hearing Screening - Comments:: Denies hearing difficulties.  Vision Screening - Comments:: Up to date with [facility] on [address]   Goals Addressed             This Visit's Progress    Maintain healthy active lifestyle.   On track      Depression Screen     12/30/2023    8:32 AM 05/27/2023    8:24 AM 12/19/2022   10:28 AM 03/26/2022    8:12 AM 12/17/2021    8:27 AM 11/19/2021    1:08 PM 12/10/2020    9:44 AM  PHQ 2/9 Scores  PHQ - 2 Score 0 0 0 0 0 0 0  PHQ- 9 Score 0          Fall Risk     12/30/2023    8:28 AM 05/27/2023    8:24 AM 12/19/2022   10:22 AM 03/26/2022    8:11 AM 12/17/2021    8:27 AM  Fall Risk   Falls in the past year? 0 0 0 0 0  Number falls in past yr: 0 0 0 0 0  Injury with Fall? 0 0 0 0 0  Risk for fall due to : No Fall Risks No Fall Risks No Fall Risks  No Fall Risks  Follow up Education provided Follow up appointment Falls evaluation completed Falls evaluation completed  Falls evaluation completed      Data saved with a previous flowsheet row definition    MEDICARE RISK AT HOME:  Medicare Risk at Home Any stairs in or around the home?: No If so, are there any without handrails?: No Home free of loose throw rugs in walkways, pet beds, electrical  cords, etc?: Yes Adequate lighting in your home to reduce risk of falls?: Yes Life alert?: No Use of a cane, Avedisian or w/c?: No Grab bars in the bathroom?: Yes Shower chair or bench in shower?: Yes Elevated toilet seat or a handicapped toilet?: No  TIMED UP AND GO:  Was the test performed?  No,audio  Cognitive  Function: 6CIT completed        12/30/2023    8:36 AM 12/19/2022   10:30 AM 12/17/2021    8:31 AM  6CIT Screen  What Year? 0 points 0 points 0 points  What month? 0 points 0 points 0 points  What time? 0 points 0 points 0 points  Count back from 20 0 points 0 points 0 points  Months in reverse 0 points 0 points 0 points  Repeat phrase 0 points 2 points 0 points  Total Score 0 points 2 points 0 points    Immunizations Immunization History  Administered Date(s) Administered   Fluad Quad(high Dose 65+) 01/11/2019   Influenza Inj Mdck Quad With Preservative 01/25/2022   Influenza Split 02/10/2011, 03/03/2012, 01/11/2014, 12/28/2015   Influenza Whole 01/31/2008, 02/28/2010   Influenza, High Dose Seasonal PF 12/27/2014, 01/04/2017, 01/21/2018   Influenza,inj,Quad PF,6+ Mos 01/17/2013   Influenza-Unspecified 01/04/2017, 01/21/2018, 03/13/2023   PFIZER Comirnaty(Gray Top)Covid-19 Tri-Sucrose Vaccine 09/05/2020   PFIZER(Purple Top)SARS-COV-2 Vaccination 08/04/2019, 08/25/2019   Pfizer Covid-19 Vaccine Bivalent Booster 5y-11y 01/10/2021   Pneumococcal Conjugate-13 12/22/2014, 11/29/2018   Pneumococcal Polysaccharide-23 02/17/2013, 09/22/2017   Tdap 07/10/2008, 07/31/2018   Unspecified SARS-COV-2 Vaccination 03/13/2023   Zoster Recombinant(Shingrix) 09/22/2017, 11/22/2017    Screening Tests Health Maintenance  Topic Date Due   MAMMOGRAM  04/15/2024   Medicare Annual Wellness (AWV)  12/29/2024   DTaP/Tdap/Td (3 - Td or Tdap) 07/30/2028   Pneumococcal Vaccine: 50+ Years  Completed   DEXA SCAN  Completed   Hepatitis C Screening  Completed   Zoster Vaccines- Shingrix  Completed   HPV VACCINES  Aged Out   Meningococcal B Vaccine  Aged Out   COVID-19 Vaccine  Discontinued   Fecal DNA (Cologuard)  Discontinued    Health Maintenance  There are no preventive care reminders to display for this patient.  Health Maintenance Items Addressed: Pt will Call [facility] to schedule mammogram. All  other HM up to date.  Additional Screening:  Vision Screening: Recommended annual ophthalmology exams for early detection of glaucoma and other disorders of the eye. Would you like a referral to an eye doctor? No    Dental Screening: Recommended annual dental exams for proper oral hygiene  Community Resource Referral / Chronic Care Management: CRR required this visit?  No   CCM required this visit?  No   Plan:    I have personally reviewed and noted the following in the patient's chart:   Medical and social history Use of alcohol, tobacco or illicit drugs  Current medications and supplements including opioid prescriptions. Patient is not currently taking opioid prescriptions. Functional ability and status Nutritional status Physical activity Advanced directives List of other physicians Hospitalizations, surgeries, and ER visits in previous 12 months Vitals Screenings to include cognitive, depression, and falls Referrals and appointments  In addition, I have reviewed and discussed with patient certain preventive protocols, quality metrics, and best practice recommendations. A written personalized care plan for preventive services as well as general preventive health recommendations were provided to patient.   Lolita Libra, CMA   12/30/2023   After Visit Summary: (MyChart) Due to this being a  telephonic visit, the after visit summary with patients personalized plan was offered to patient via MyChart   Notes: see phone note

## 2023-12-30 NOTE — Telephone Encounter (Signed)
 Pt had AWV today. Pt stated she has not taken Nortriptyline  in almost a year and feels like she no longer needs the medication. I have pended the med for removal in her AWV today if PCP is agreeable.  Also, previous counselor, Slater Mam has retired and was prescribing pt Ambien . New counselor advised pt to reach out to her PCP for future refills. Also advised pt that follow up with them would be as needed. Please advise refill request. Pt does not have future appt with PCP at this time and I have left her a message to call us  back to schedule one at her earliest convenience.  Pt wanted to make you aware that her job has cut her position down to part time now and she is not the happiest about this but is adjusting.

## 2024-01-18 ENCOUNTER — Other Ambulatory Visit: Payer: Self-pay | Admitting: Family Medicine

## 2024-01-18 ENCOUNTER — Encounter: Payer: Self-pay | Admitting: Family Medicine

## 2024-01-18 NOTE — Telephone Encounter (Signed)
 Copied from CRM [id number]. Topic: Clinical - Medication Question >> Jan 18, 2024 10:58 AM Vena HERO wrote: Reason for CRM: Pt called in to request a script for a covid vax. Please send to [id number] - [place], [place] - [address] [place] [address] Phone: [phone] Fax: [phone] Hours: Not open 24 hours

## 2024-02-17 ENCOUNTER — Other Ambulatory Visit: Payer: Self-pay | Admitting: Family Medicine

## 2024-02-17 DIAGNOSIS — J301 Allergic rhinitis due to pollen: Secondary | ICD-10-CM

## 2024-02-24 ENCOUNTER — Other Ambulatory Visit: Payer: Self-pay | Admitting: Family Medicine

## 2024-02-24 DIAGNOSIS — M8580 Other specified disorders of bone density and structure, unspecified site: Secondary | ICD-10-CM

## 2024-03-02 NOTE — Progress Notes (Signed)
 [facility] [address], [address] [place], [place] [address] [phone]  Date:  03/14/2024   Name:  Alicia Wallace   DOB:  May 24, 1945   MRN:  [id number]  PCP:  Watt Harlene BROCKS, MD    Chief Complaint: No chief complaint on file.   History of Present Illness:  Alicia Wallace is a 78 y.o. very pleasant female patient who presents with the following:  Patient seen today for periodic follow-up.  I saw her most recently in January History of hypertension, hypothyroidism, hyperlipidemia, prediabetes, osteopenia We discussed her recent bone density scan and December and started her back on Fosamax  which she had taken previously  Labs done in January; can update Mammogram due in December DEXA scan up-to-date Cologuard up-to-date Recommend 1 dose of RSV if not done already- done 11/24 Flu shot- done at [facility]  COVID booster- done already   Levothyroxine  100 Losartan  100 Lopressor  50 BID Nortriptyline  100 at bedtime- she is NOT taking any longer actually  Ambien    Discussed the use of AI scribe software for clinical note transcription with the patient, who gave verbal consent to proceed.  History of Present Illness Alicia Wallace is a 78 year old female who presents for medication refills and follow-up.  She manages her allergies with Xazol and occasionally Mucinex , which helps alleviate mucus. She experiences occasional shortness of breath, which she attributes to allergies and residual effects from a past COVID-19 infection. Her lung function has not fully recovered since the infection. She denies new chest pain or significant difficulty breathing, noting that shortness of breath occurs sporadically and is not specifically triggered by exercise.  She is currently taking Ambien , metoprolol , losartan , and levothyroxine . She has discontinued her nighttime dose of metoprolol  due to frequent urination disrupting her sleep, and has  been taking only the morning dose for the past six weeks.  She has received her flu, COVID, and RSV vaccinations, with the RSV vaccine administered around November of the previous year. She is due for a mammogram next month and her bone density is up to date. She is currently on Fosamax , which she tolerates well.  She has reduced her work hours to part-time, finding it beneficial as she was feeling tired after full-time work. She engages in walking for exercise. She has a history of pneumonia but did not receive medication during her last hospital visit for it. She has since stopped taking nortriptyline .    Patient Active Problem List   Diagnosis Date Noted   Community acquired pneumonia 09/24/2022   Pneumonia 09/23/2022   AKI (acute kidney injury) 09/23/2022   Prediabetes 06/03/2021   Other and unspecified hyperlipidemia 08/22/2013   HTN (hypertension) 04/06/2012   Osteopenia 04/06/2012   Seasonal and perennial allergic rhinitis 07/15/2010   Hypothyroidism 05/30/2008   Asthma, mild intermittent 07/14/2007    Past Medical History:  Diagnosis Date   Allergic rhinitis    Asthma    Hypertension    Hypothyroid     Past Surgical History:  Procedure Laterality Date   APPENDECTOMY     CHOLECYSTECTOMY     TONSILLECTOMY     TOTAL ABDOMINAL HYSTERECTOMY W/ BILATERAL SALPINGOOPHORECTOMY     WISDOM TOOTH EXTRACTION      Social History   Tobacco Use   Smoking status: Never   Smokeless tobacco: Never  Vaping Use   Vaping status: Never Used  Substance Use Topics   Alcohol use: No  Alcohol/week: 0.0 standard drinks of alcohol   Drug use: No    Family History  Problem Relation Age of Onset   Kidney disease Mother    Heart disease Father    Cancer Father        bladder   Asthma Father    Allergies Father     Allergies  Allergen Reactions   Influenza Vac Split Quad Swelling and Other (See Comments)    Size of grapefruit red, swollen area at injection site  Leg  cramps  Vaccine given was Fluarix Quadrivalent 2014/2015 formula by [employer]   Penicillins Hives    Has patient had a PCN reaction causing immediate rash, facial/tongue/throat swelling, SOB or lightheadedness with hypotension: yes Has patient had a PCN reaction causing severe rash involving mucus membranes or skin necrosis: no Has patient had a PCN reaction that required hospitalization: no Has patient had a PCN reaction occurring within the last 10 years: no If all of the above answers are NO, then may proceed with Cephalosporin use.    Sulfonamide Derivatives Hives    Medication list has been reviewed and updated.  Current Outpatient Medications on File Prior to Visit  Medication Sig Dispense Refill   ALAWAY 0.035 % ophthalmic solution Place 1 drop into both eyes in the morning.     albuterol  (VENTOLIN  HFA) 108 (90 Base) MCG/ACT inhaler TAKE 2 PUFFS BY MOUTH EVERY 6 HOURS AS NEEDED FOR WHEEZE OR SHORTNESS OF BREATH 8.5 each 3   alendronate  (FOSAMAX ) 70 MG tablet TAKE 1 TABLET EVERY 7 DAYS WITH A FULL GLASS OF WATER ON AN EMPTY STOMACH. 12 tablet 3   Cholecalciferol (VITAMIN D3) 25 MCG (1000 UT) CHEW Chew 1,500 Units by mouth daily.     estradiol  (CLIMARA  - DOSED IN MG/24 HR) 0.025 mg/24hr patch Place 0.025 mg onto the skin every Thursday.  2   levocetirizine (XYZAL ) 5 MG tablet Take 1 tablet (5 mg total) by mouth every evening. Needs appt 90 tablet 0   promethazine -dextromethorphan (PROMETHAZINE -DM) 6.25-15 MG/5ML syrup Take 5 mLs by mouth 4 (four) times daily as needed for cough. Do not combine with ambien  90 mL 0   rosuvastatin  (CRESTOR ) 10 MG tablet Take 1 tablet (10 mg total) by mouth daily. 90 tablet 3   triamcinolone  cream (KENALOG ) 0.1 % APPLY TOPICLLY TO THE AFFECTED AREA(S) TWO TIMES DAILY AS NEEDED 80 g 11   No current facility-administered medications on file prior to visit.    Review of Systems:  As per HPI- otherwise negative.   Physical Examination: Vitals:    03/14/24 0838 03/14/24 0854  BP: (!) 145/81 (!) 140/85  Pulse: 61   Resp: 16   SpO2: 100%    Vitals:   03/14/24 0838  Weight: 150 lb (68 kg)  Height: 4' 10 (1.473 m)   Body mass index is 31.35 kg/m. Ideal Body Weight: Weight in (lb) to have BMI = 25: 119.4  GEN: no acute distress. Looks well, petite build, mild obesity. Very spry for age  HEENT: Atraumatic, Normocephalic.  Ears and Nose: No external deformity. CV: RRR, No M/G/R. No JVD. No thrill. No extra heart sounds. PULM: CTA B, no wheezes, crackles, rhonchi. No retractions. No resp. distress. No accessory muscle use. ABD: S, NT, ND, EXTR: No c/c/e PSYCH: Normally interactive. Conversant.    Assessment and Plan: Osteopenia, unspecified location  Essential hypertension - Plan: CBC, Comprehensive metabolic panel with GFR, losartan  (COZAAR ) 100 MG tablet, metoprolol  succinate (TOPROL -XL) 50 MG 24 hr tablet  Dyslipidemia - Plan: Lipid panel  Prediabetes - Plan: Hemoglobin A1c  Hypothyroidism due to acquired atrophy of thyroid  - Plan: TSH, levothyroxine  (SYNTHROID ) 100 MCG tablet  Insomnia, unspecified type - Plan: zolpidem  (AMBIEN ) 10 MG tablet  Seasonal allergic rhinitis due to pollen  Assessment & Plan Essential hypertension Blood pressure slightly elevated, possibly due to missed metoprolol  dose. Nocturia led to discontinuation of evening dose. - Switch to metoprolol  XL 50 mg once daily. - Monitor blood pressure and symptoms for hypotension or lightheadedness.  Seasonal allergic rhinitis due to pollen Allergies managed with levocetirizine and occasional Mucinex .  Osteopenia Managed with Fosamax , well-tolerated.  Hypothyroidism due to acquired atrophy of thyroid  Managed with levothyroxine , requires refill. - Refill levothyroxine  prescription.  General Health Maintenance Vaccinations up to date. Mammogram due next month. Bone density up to date. Cologuard screening due next summer. - Schedule mammogram for  next month. - Ensure Cologuard screening is scheduled for next summer.  Signed Harlene Schroeder, MD  Received her labs 11/4, message to patient  Results for orders placed or performed in visit on 03/14/24  CBC   Collection Time: 03/14/24  8:56 AM  Result Value Ref Range   WBC 8.0 4.0 - 10.5 K/uL   RBC 4.28 3.87 - 5.11 Mil/uL   Platelets 261.0 150.0 - 400.0 K/uL   Hemoglobin 13.1 12.0 - 15.0 g/dL   HCT 60.4 63.9 - 53.9 %   MCV 92.4 78.0 - 100.0 fl   MCHC 33.1 30.0 - 36.0 g/dL   RDW 85.7 88.4 - 84.4 %  Comprehensive metabolic panel with GFR   Collection Time: 03/14/24  8:56 AM  Result Value Ref Range   Sodium 137 135 - 145 mEq/L   Potassium 4.4 3.5 - 5.1 mEq/L   Chloride 100 96 - 112 mEq/L   CO2 30 19 - 32 mEq/L   Glucose, Bld 87 70 - 99 mg/dL   BUN 15 6 - 23 mg/dL   Creatinine, Ser 9.31 0.40 - 1.20 mg/dL   Total Bilirubin 0.5 0.2 - 1.2 mg/dL   Alkaline Phosphatase 64 39 - 117 U/L   AST 17 0 - 37 U/L   ALT 15 0 - 35 U/L   Total Protein 6.8 6.0 - 8.3 g/dL   Albumin  4.2 3.5 - 5.2 g/dL   GFR 15.51 >39.99 mL/min   Calcium  9.1 8.4 - 10.5 mg/dL  Hemoglobin J8r   Collection Time: 03/14/24  8:56 AM  Result Value Ref Range   Hgb A1c MFr Bld 6.0 4.6 - 6.5 %  Lipid panel   Collection Time: 03/14/24  8:56 AM  Result Value Ref Range   Cholesterol 234 (H) 0 - 200 mg/dL   Triglycerides 829.9 (H) 0.0 - 149.0 mg/dL   HDL 45.49 >60.99 mg/dL   VLDL 65.9 0.0 - 59.9 mg/dL   LDL Cholesterol 853 (H) 0 - 99 mg/dL   Total CHOL/HDL Ratio 4    NonHDL 179.74   TSH   Collection Time: 03/14/24  8:56 AM  Result Value Ref Range   TSH 5.82 (H) 0.35 - 5.50 uIU/mL

## 2024-03-02 NOTE — Patient Instructions (Addendum)
 It was great to see you again today I will be in touch with your labs Recommend a COVID booster this fall and 1 dose of RSV if not done already- DONE!  We will change to Toprol  XL- once a day metoprolol  Please see me in about 6 month

## 2024-03-14 ENCOUNTER — Ambulatory Visit: Admitting: Family Medicine

## 2024-03-14 VITALS — BP 140/85 | HR 61 | Resp 16 | Ht <= 58 in | Wt 150.0 lb

## 2024-03-14 DIAGNOSIS — M8580 Other specified disorders of bone density and structure, unspecified site: Secondary | ICD-10-CM

## 2024-03-14 DIAGNOSIS — R7303 Prediabetes: Secondary | ICD-10-CM | POA: Diagnosis not present

## 2024-03-14 DIAGNOSIS — I10 Essential (primary) hypertension: Secondary | ICD-10-CM

## 2024-03-14 DIAGNOSIS — E034 Atrophy of thyroid (acquired): Secondary | ICD-10-CM | POA: Diagnosis not present

## 2024-03-14 DIAGNOSIS — J301 Allergic rhinitis due to pollen: Secondary | ICD-10-CM

## 2024-03-14 DIAGNOSIS — E785 Hyperlipidemia, unspecified: Secondary | ICD-10-CM

## 2024-03-14 DIAGNOSIS — G4700 Insomnia, unspecified: Secondary | ICD-10-CM | POA: Diagnosis not present

## 2024-03-14 LAB — COMPREHENSIVE METABOLIC PANEL WITH GFR
ALT: 15 U/L (ref 0–35)
AST: 17 U/L (ref 0–37)
Albumin: 4.2 g/dL (ref 3.5–5.2)
Alkaline Phosphatase: 64 U/L (ref 39–117)
BUN: 15 mg/dL (ref 6–23)
CO2: 30 meq/L (ref 19–32)
Calcium: 9.1 mg/dL (ref 8.4–10.5)
Chloride: 100 meq/L (ref 96–112)
Creatinine, Ser: 0.68 mg/dL (ref 0.40–1.20)
GFR: 84.48 mL/min (ref >60.00)
Glucose, Bld: 87 mg/dL (ref 70–99)
Potassium: 4.4 meq/L (ref 3.5–5.1)
Sodium: 137 meq/L (ref 135–145)
Total Bilirubin: 0.5 mg/dL (ref 0.2–1.2)
Total Protein: 6.8 g/dL (ref 6.0–8.3)

## 2024-03-14 LAB — LIPID PANEL
Cholesterol: 234 mg/dL — ABNORMAL HIGH (ref 0–200)
HDL: 54.5 mg/dL (ref >39.00)
LDL Cholesterol: 146 mg/dL — ABNORMAL HIGH (ref 0–99)
NonHDL: 179.74
Total CHOL/HDL Ratio: 4
Triglycerides: 170 mg/dL — ABNORMAL HIGH (ref 0.0–149.0)
VLDL: 34 mg/dL (ref 0.0–40.0)

## 2024-03-14 LAB — CBC
HCT: 39.5 % (ref 36.0–46.0)
Hemoglobin: 13.1 g/dL (ref 12.0–15.0)
MCHC: 33.1 g/dL (ref 30.0–36.0)
MCV: 92.4 fl (ref 78.0–100.0)
Platelets: 261 K/uL (ref 150.0–400.0)
RBC: 4.28 Mil/uL (ref 3.87–5.11)
RDW: 14.2 % (ref 11.5–15.5)
WBC: 8 K/uL (ref 4.0–10.5)

## 2024-03-14 LAB — TSH: TSH: 5.82 u[IU]/mL — ABNORMAL HIGH (ref 0.35–5.50)

## 2024-03-14 LAB — HEMOGLOBIN A1C: Hgb A1c MFr Bld: 6 % (ref 4.6–6.5)

## 2024-03-14 MED ORDER — LOSARTAN POTASSIUM 100 MG PO TABS: 100.0000 mg | ORAL_TABLET | Freq: Every day | ORAL | 3 refills | Status: AC

## 2024-03-14 MED ORDER — LEVOTHYROXINE SODIUM 100 MCG PO TABS: 100.0000 ug | ORAL_TABLET | Freq: Every day | ORAL | 3 refills | Status: DC

## 2024-03-14 MED ORDER — METOPROLOL SUCCINATE ER 50 MG PO TB24: 50.0000 mg | ORAL_TABLET | Freq: Every day | ORAL | 3 refills | Status: AC

## 2024-03-14 MED ORDER — ZOLPIDEM TARTRATE 10 MG PO TABS: ORAL_TABLET | ORAL | 5 refills | Status: AC

## 2024-03-15 ENCOUNTER — Encounter: Payer: Self-pay | Admitting: Family Medicine

## 2024-03-15 DIAGNOSIS — E034 Atrophy of thyroid (acquired): Secondary | ICD-10-CM

## 2024-03-16 MED ORDER — LEVOTHYROXINE SODIUM 125 MCG PO TABS: 125.0000 ug | ORAL_TABLET | Freq: Every day | ORAL | 3 refills | Status: AC

## 2024-03-16 MED ORDER — LEVOTHYROXINE SODIUM 125 MCG PO TABS: 125.0000 ug | ORAL_TABLET | Freq: Every day | ORAL | 3 refills | Status: DC

## 2024-03-16 NOTE — Addendum Note (Signed)
 Addended by: WATT RAISIN C on: 03/16/2024 12:59 PM   Modules accepted: Orders

## 2024-03-16 NOTE — Telephone Encounter (Signed)
 Copied from CRM [id number]. Topic: Clinical - Lab/Test Results >> Mar 16, 2024 11:03 AM Alfonso HERO wrote: Reason for CRM: patient calling to request call to review her labs and to also ask if they can be mailed to her. She cannot access her mychart.

## 2024-04-20 DIAGNOSIS — Z1231 Encounter for screening mammogram for malignant neoplasm of breast: Secondary | ICD-10-CM | POA: Diagnosis not present

## 2024-04-20 LAB — HM MAMMOGRAPHY

## 2024-04-21 ENCOUNTER — Encounter: Payer: Self-pay | Admitting: Family Medicine

## 2024-05-25 ENCOUNTER — Other Ambulatory Visit: Payer: Self-pay | Admitting: Family Medicine

## 2024-05-25 DIAGNOSIS — J301 Allergic rhinitis due to pollen: Secondary | ICD-10-CM

## 2024-06-07 ENCOUNTER — Other Ambulatory Visit: Payer: Self-pay | Admitting: Family Medicine

## 2024-06-07 DIAGNOSIS — L309 Dermatitis, unspecified: Secondary | ICD-10-CM

## 2024-09-28 ENCOUNTER — Ambulatory Visit: Admitting: Family Medicine

## 2024-12-30 ENCOUNTER — Ambulatory Visit
